# Patient Record
Sex: Male | Born: 1960
Health system: Southern US, Community
[De-identification: ages and names within clinical notes are randomized; demographics above are authoritative.]

## PROBLEM LIST (undated history)

## (undated) DIAGNOSIS — R399 Unspecified symptoms and signs involving the genitourinary system: Secondary | ICD-10-CM

## (undated) DIAGNOSIS — N4340 Spermatocele of epididymis, unspecified: Secondary | ICD-10-CM

## (undated) DIAGNOSIS — R600 Localized edema: Secondary | ICD-10-CM

## (undated) DIAGNOSIS — I4892 Unspecified atrial flutter: Secondary | ICD-10-CM

## (undated) DIAGNOSIS — I10 Essential (primary) hypertension: Secondary | ICD-10-CM

## (undated) DIAGNOSIS — K219 Gastro-esophageal reflux disease without esophagitis: Secondary | ICD-10-CM

## (undated) DIAGNOSIS — J189 Pneumonia, unspecified organism: Secondary | ICD-10-CM

## (undated) DIAGNOSIS — I5032 Chronic diastolic (congestive) heart failure: Secondary | ICD-10-CM

## (undated) DIAGNOSIS — I480 Paroxysmal atrial fibrillation: Secondary | ICD-10-CM

## (undated) DIAGNOSIS — N433 Hydrocele, unspecified: Secondary | ICD-10-CM

## (undated) DIAGNOSIS — I428 Other cardiomyopathies: Secondary | ICD-10-CM

## (undated) DIAGNOSIS — L4050 Arthropathic psoriasis, unspecified: Secondary | ICD-10-CM

## (undated) DIAGNOSIS — L409 Psoriasis, unspecified: Secondary | ICD-10-CM

## (undated) DIAGNOSIS — M21372 Foot drop, left foot: Secondary | ICD-10-CM

## (undated) DIAGNOSIS — Z8719 Personal history of other diseases of the digestive system: Secondary | ICD-10-CM

## (undated) DIAGNOSIS — E7800 Pure hypercholesterolemia, unspecified: Secondary | ICD-10-CM

## (undated) DIAGNOSIS — N529 Male erectile dysfunction, unspecified: Secondary | ICD-10-CM

## (undated) DIAGNOSIS — I499 Cardiac arrhythmia, unspecified: Secondary | ICD-10-CM

## (undated) DIAGNOSIS — I82409 Acute embolism and thrombosis of unspecified deep veins of unspecified lower extremity: Secondary | ICD-10-CM

## (undated) DIAGNOSIS — Z87448 Personal history of other diseases of urinary system: Secondary | ICD-10-CM

## (undated) DIAGNOSIS — E785 Hyperlipidemia, unspecified: Secondary | ICD-10-CM

## (undated) DIAGNOSIS — L0390 Cellulitis, unspecified: Secondary | ICD-10-CM

## (undated) DIAGNOSIS — L03116 Cellulitis of left lower limb: Secondary | ICD-10-CM

## (undated) DIAGNOSIS — Z973 Presence of spectacles and contact lenses: Secondary | ICD-10-CM

## (undated) DIAGNOSIS — Z7901 Long term (current) use of anticoagulants: Secondary | ICD-10-CM

## (undated) HISTORY — PX: ORIF METATARSAL FRACTURE: SUR942

## (undated) HISTORY — PX: BACK SURGERY: SHX140

## (undated) HISTORY — PX: APPENDECTOMY: SHX54

## (undated) HISTORY — DX: Paroxysmal atrial fibrillation: I48.0

## (undated) HISTORY — PX: FRACTURE SURGERY: SHX138

## (undated) HISTORY — DX: Arthropathic psoriasis, unspecified: L40.50

## (undated) HISTORY — DX: Morbid (severe) obesity due to excess calories: E66.01

## (undated) HISTORY — PX: CARDIOVERSION: SHX1299

## (undated) HISTORY — PX: TONSILLECTOMY: SUR1361

## (undated) HISTORY — PX: COLONOSCOPY: SHX174

## (undated) HISTORY — DX: Essential (primary) hypertension: I10

---

## 1999-08-02 HISTORY — PX: ORIF METATARSAL FRACTURE: SUR942

## 2003-02-20 ENCOUNTER — Inpatient Hospital Stay (HOSPITAL_COMMUNITY): Admission: AD | Admit: 2003-02-20 | Discharge: 2003-02-23 | Payer: Self-pay | Admitting: Otolaryngology

## 2003-11-25 ENCOUNTER — Encounter: Admission: RE | Admit: 2003-11-25 | Discharge: 2003-11-25 | Payer: Self-pay | Admitting: Infectious Diseases

## 2004-03-17 ENCOUNTER — Emergency Department (HOSPITAL_COMMUNITY): Admission: EM | Admit: 2004-03-17 | Discharge: 2004-03-17 | Payer: Self-pay | Admitting: Emergency Medicine

## 2004-03-31 ENCOUNTER — Encounter: Admission: RE | Admit: 2004-03-31 | Discharge: 2004-03-31 | Payer: Self-pay

## 2004-04-15 ENCOUNTER — Encounter: Admission: RE | Admit: 2004-04-15 | Discharge: 2004-04-15 | Payer: Self-pay

## 2004-04-29 ENCOUNTER — Encounter: Admission: RE | Admit: 2004-04-29 | Discharge: 2004-04-29 | Payer: Self-pay

## 2005-06-24 ENCOUNTER — Emergency Department (HOSPITAL_COMMUNITY): Admission: EM | Admit: 2005-06-24 | Discharge: 2005-06-24 | Payer: Self-pay | Admitting: Emergency Medicine

## 2005-07-20 ENCOUNTER — Encounter: Admission: RE | Admit: 2005-07-20 | Discharge: 2005-07-20 | Payer: Self-pay | Admitting: Neurological Surgery

## 2007-08-09 ENCOUNTER — Ambulatory Visit: Payer: Self-pay | Admitting: Internal Medicine

## 2007-08-13 ENCOUNTER — Ambulatory Visit: Payer: Self-pay | Admitting: Cardiology

## 2007-08-13 ENCOUNTER — Ambulatory Visit: Payer: Self-pay

## 2007-08-14 ENCOUNTER — Ambulatory Visit: Payer: Self-pay

## 2007-08-14 ENCOUNTER — Encounter: Payer: Self-pay | Admitting: Internal Medicine

## 2007-08-20 ENCOUNTER — Ambulatory Visit: Payer: Self-pay | Admitting: Cardiology

## 2007-08-27 ENCOUNTER — Ambulatory Visit: Payer: Self-pay | Admitting: Cardiology

## 2007-08-31 ENCOUNTER — Ambulatory Visit: Payer: Self-pay | Admitting: Internal Medicine

## 2007-08-31 LAB — CONVERTED CEMR LAB
BUN: 27 mg/dL — ABNORMAL HIGH (ref 6–23)
Basophils Absolute: 0 10*3/uL (ref 0.0–0.1)
CO2: 29 meq/L (ref 19–32)
Eosinophils Absolute: 0.2 10*3/uL (ref 0.0–0.6)
GFR calc Af Amer: 116 mL/min
Hemoglobin: 15.7 g/dL (ref 13.0–17.0)
Lymphocytes Relative: 37.6 % (ref 12.0–46.0)
MCHC: 33.1 g/dL (ref 30.0–36.0)
Monocytes Absolute: 0.6 10*3/uL (ref 0.2–0.7)
Monocytes Relative: 11.3 % — ABNORMAL HIGH (ref 3.0–11.0)
Neutro Abs: 2.6 10*3/uL (ref 1.4–7.7)
Potassium: 4.3 meq/L (ref 3.5–5.1)
aPTT: 39.6 s — ABNORMAL HIGH (ref 21.7–29.8)

## 2007-09-03 ENCOUNTER — Ambulatory Visit: Payer: Self-pay | Admitting: Internal Medicine

## 2007-09-03 ENCOUNTER — Encounter: Payer: Self-pay | Admitting: Internal Medicine

## 2007-09-03 ENCOUNTER — Ambulatory Visit (HOSPITAL_COMMUNITY): Admission: RE | Admit: 2007-09-03 | Discharge: 2007-09-03 | Payer: Self-pay | Admitting: Internal Medicine

## 2007-09-10 ENCOUNTER — Ambulatory Visit: Payer: Self-pay | Admitting: Cardiology

## 2007-10-11 ENCOUNTER — Ambulatory Visit: Payer: Self-pay | Admitting: Cardiology

## 2007-10-11 ENCOUNTER — Ambulatory Visit: Payer: Self-pay | Admitting: Internal Medicine

## 2007-10-18 ENCOUNTER — Ambulatory Visit: Payer: Self-pay | Admitting: Internal Medicine

## 2007-10-18 LAB — CONVERTED CEMR LAB
BUN: 13 mg/dL (ref 6–23)
CO2: 30 meq/L (ref 19–32)
GFR calc Af Amer: 133 mL/min
Potassium: 4.1 meq/L (ref 3.5–5.1)

## 2007-12-12 ENCOUNTER — Ambulatory Visit: Payer: Self-pay | Admitting: Internal Medicine

## 2007-12-17 ENCOUNTER — Ambulatory Visit: Payer: Self-pay | Admitting: Internal Medicine

## 2007-12-17 ENCOUNTER — Ambulatory Visit: Payer: Self-pay | Admitting: Cardiology

## 2007-12-17 LAB — CONVERTED CEMR LAB
Basophils Absolute: 0 10*3/uL (ref 0.0–0.1)
Eosinophils Absolute: 0.1 10*3/uL (ref 0.0–0.7)
GFR calc Af Amer: 133 mL/min
GFR calc non Af Amer: 110 mL/min
Glucose, Bld: 102 mg/dL — ABNORMAL HIGH (ref 70–99)
HCT: 45.2 % (ref 39.0–52.0)
INR: 1.6 — ABNORMAL HIGH (ref 0.8–1.0)
Monocytes Absolute: 0.5 10*3/uL (ref 0.1–1.0)
Monocytes Relative: 10.2 % (ref 3.0–12.0)
Platelets: 152 10*3/uL (ref 150–400)
Potassium: 4.2 meq/L (ref 3.5–5.1)
RDW: 12.5 % (ref 11.5–14.6)
Sodium: 141 meq/L (ref 135–145)
aPTT: 33.9 s — ABNORMAL HIGH (ref 21.7–29.8)

## 2007-12-21 ENCOUNTER — Ambulatory Visit: Payer: Self-pay | Admitting: Internal Medicine

## 2007-12-25 ENCOUNTER — Ambulatory Visit: Payer: Self-pay | Admitting: Internal Medicine

## 2007-12-25 ENCOUNTER — Inpatient Hospital Stay (HOSPITAL_COMMUNITY): Admission: AD | Admit: 2007-12-25 | Discharge: 2007-12-28 | Payer: Self-pay | Admitting: Internal Medicine

## 2007-12-25 ENCOUNTER — Encounter: Payer: Self-pay | Admitting: Internal Medicine

## 2007-12-25 ENCOUNTER — Ambulatory Visit: Payer: Self-pay | Admitting: Cardiology

## 2007-12-26 ENCOUNTER — Encounter: Payer: Self-pay | Admitting: Internal Medicine

## 2007-12-26 LAB — CONVERTED CEMR LAB: Magnesium: 2.1 mg/dL (ref 1.5–2.5)

## 2008-01-04 ENCOUNTER — Ambulatory Visit: Payer: Self-pay | Admitting: Internal Medicine

## 2008-01-24 ENCOUNTER — Ambulatory Visit: Payer: Self-pay | Admitting: Internal Medicine

## 2008-01-24 LAB — CONVERTED CEMR LAB
AST: 26 units/L (ref 0–37)
Albumin: 3.7 g/dL (ref 3.5–5.2)
Alkaline Phosphatase: 66 units/L (ref 39–117)
BUN: 20 mg/dL (ref 6–23)
Bilirubin, Direct: 0.1 mg/dL (ref 0.0–0.3)
CO2: 27 meq/L (ref 19–32)
Chloride: 106 meq/L (ref 96–112)
Cholesterol: 181 mg/dL (ref 0–200)
Glucose, Bld: 103 mg/dL — ABNORMAL HIGH (ref 70–99)
Potassium: 4.3 meq/L (ref 3.5–5.1)
Total Protein: 6.8 g/dL (ref 6.0–8.3)

## 2008-02-11 ENCOUNTER — Ambulatory Visit: Payer: Self-pay

## 2008-03-03 ENCOUNTER — Ambulatory Visit: Payer: Self-pay | Admitting: Internal Medicine

## 2008-03-24 ENCOUNTER — Ambulatory Visit: Payer: Self-pay | Admitting: Internal Medicine

## 2008-04-08 ENCOUNTER — Ambulatory Visit: Payer: Self-pay | Admitting: Cardiology

## 2008-04-21 ENCOUNTER — Ambulatory Visit: Payer: Self-pay | Admitting: Internal Medicine

## 2008-05-05 ENCOUNTER — Ambulatory Visit: Payer: Self-pay | Admitting: Internal Medicine

## 2008-05-15 DIAGNOSIS — I10 Essential (primary) hypertension: Secondary | ICD-10-CM

## 2008-05-15 DIAGNOSIS — I4891 Unspecified atrial fibrillation: Secondary | ICD-10-CM | POA: Insufficient documentation

## 2008-05-23 ENCOUNTER — Ambulatory Visit: Payer: Self-pay | Admitting: Internal Medicine

## 2008-05-23 LAB — CONVERTED CEMR LAB
ALT: 38 units/L (ref 0–53)
Bilirubin, Direct: 0.2 mg/dL (ref 0.0–0.3)
CO2: 30 meq/L (ref 19–32)
CRP, High Sensitivity: 5 (ref 0.00–5.00)
Calcium: 8.4 mg/dL (ref 8.4–10.5)
Chloride: 108 meq/L (ref 96–112)
GFR calc non Af Amer: 110 mL/min
LDL Cholesterol: 84 mg/dL (ref 0–99)
Sodium: 142 meq/L (ref 135–145)
Total Bilirubin: 0.7 mg/dL (ref 0.3–1.2)
Total CHOL/HDL Ratio: 4.2

## 2008-06-20 ENCOUNTER — Ambulatory Visit: Payer: Self-pay | Admitting: Cardiology

## 2008-07-04 ENCOUNTER — Ambulatory Visit: Payer: Self-pay | Admitting: Internal Medicine

## 2008-07-11 ENCOUNTER — Ambulatory Visit: Payer: Self-pay | Admitting: Cardiovascular Disease

## 2008-07-23 ENCOUNTER — Ambulatory Visit: Payer: Self-pay | Admitting: Cardiology

## 2008-08-13 ENCOUNTER — Ambulatory Visit: Payer: Self-pay | Admitting: Cardiology

## 2008-09-03 ENCOUNTER — Ambulatory Visit: Payer: Self-pay | Admitting: Internal Medicine

## 2008-09-03 ENCOUNTER — Ambulatory Visit: Payer: Self-pay | Admitting: Cardiovascular Disease

## 2008-09-03 LAB — CONVERTED CEMR LAB
ALT: 34 units/L (ref 0–53)
Albumin: 3.5 g/dL (ref 3.5–5.2)
Bilirubin, Direct: 0.2 mg/dL (ref 0.0–0.3)
Cholesterol: 136 mg/dL (ref 0–200)
HDL: 30 mg/dL — ABNORMAL LOW (ref >39.0)
LDL Cholesterol: 92 mg/dL (ref 0–99)
Total Protein: 6.3 g/dL (ref 6.0–8.3)
VLDL: 14 mg/dL (ref 0–40)

## 2008-10-02 ENCOUNTER — Ambulatory Visit: Payer: Self-pay | Admitting: Cardiology

## 2008-10-16 ENCOUNTER — Ambulatory Visit: Payer: Self-pay | Admitting: Internal Medicine

## 2008-11-13 ENCOUNTER — Ambulatory Visit: Payer: Self-pay | Admitting: Cardiology

## 2008-12-11 ENCOUNTER — Ambulatory Visit: Payer: Self-pay | Admitting: Cardiology

## 2008-12-31 ENCOUNTER — Encounter: Payer: Self-pay | Admitting: *Deleted

## 2009-01-01 ENCOUNTER — Ambulatory Visit: Payer: Self-pay | Admitting: Cardiology

## 2009-01-01 LAB — CONVERTED CEMR LAB
POC INR: 3.1
Protime: 21.3

## 2009-01-21 ENCOUNTER — Ambulatory Visit: Payer: Self-pay | Admitting: Internal Medicine

## 2009-01-21 DIAGNOSIS — R609 Edema, unspecified: Secondary | ICD-10-CM

## 2009-01-21 LAB — CONVERTED CEMR LAB
INR: 4.9 — ABNORMAL HIGH (ref 0.8–1.0)
POC INR: 4.9

## 2009-02-04 ENCOUNTER — Ambulatory Visit: Payer: Self-pay | Admitting: Internal Medicine

## 2009-02-04 ENCOUNTER — Encounter: Payer: Self-pay | Admitting: *Deleted

## 2009-02-04 LAB — CONVERTED CEMR LAB
BUN: 16 mg/dL (ref 6–23)
CO2: 29 meq/L (ref 19–32)
Calcium: 8.4 mg/dL (ref 8.4–10.5)
Chloride: 108 meq/L (ref 96–112)
Creatinine, Ser: 0.7 mg/dL (ref 0.4–1.5)
Glucose, Bld: 101 mg/dL — ABNORMAL HIGH (ref 70–99)

## 2009-02-05 ENCOUNTER — Encounter: Payer: Self-pay | Admitting: Internal Medicine

## 2009-02-20 ENCOUNTER — Ambulatory Visit: Payer: Self-pay | Admitting: Internal Medicine

## 2009-02-20 LAB — CONVERTED CEMR LAB: POC INR: 3.9

## 2009-03-16 ENCOUNTER — Ambulatory Visit: Payer: Self-pay | Admitting: Cardiology

## 2009-04-07 ENCOUNTER — Ambulatory Visit: Payer: Self-pay | Admitting: Cardiology

## 2009-04-21 ENCOUNTER — Ambulatory Visit: Payer: Self-pay | Admitting: Internal Medicine

## 2009-04-21 LAB — CONVERTED CEMR LAB: POC INR: 3.6

## 2009-05-05 ENCOUNTER — Ambulatory Visit: Payer: Self-pay | Admitting: Cardiology

## 2009-05-05 LAB — CONVERTED CEMR LAB: POC INR: 3.5

## 2009-05-26 ENCOUNTER — Ambulatory Visit: Payer: Self-pay | Admitting: Cardiology

## 2009-05-26 LAB — CONVERTED CEMR LAB: POC INR: 2.3

## 2009-06-16 ENCOUNTER — Ambulatory Visit: Payer: Self-pay | Admitting: Cardiology

## 2009-06-16 LAB — CONVERTED CEMR LAB: POC INR: 2.9

## 2009-07-14 ENCOUNTER — Ambulatory Visit: Payer: Self-pay | Admitting: Cardiovascular Disease

## 2009-07-14 LAB — CONVERTED CEMR LAB: POC INR: 2.3

## 2009-08-13 ENCOUNTER — Ambulatory Visit: Payer: Self-pay | Admitting: Cardiology

## 2009-08-13 LAB — CONVERTED CEMR LAB: POC INR: 1.8

## 2009-08-28 ENCOUNTER — Ambulatory Visit: Payer: Self-pay | Admitting: Internal Medicine

## 2009-09-15 ENCOUNTER — Ambulatory Visit: Payer: Self-pay | Admitting: Cardiology

## 2009-09-15 ENCOUNTER — Ambulatory Visit: Payer: Self-pay | Admitting: Internal Medicine

## 2009-09-16 LAB — CONVERTED CEMR LAB
ALT: 40 units/L (ref 0–53)
AST: 22 units/L (ref 0–37)
BUN: 19 mg/dL (ref 6–23)
Bilirubin, Direct: 0.1 mg/dL (ref 0.0–0.3)
CO2: 28 meq/L (ref 19–32)
Calcium: 8.6 mg/dL (ref 8.4–10.5)
GFR calc non Af Amer: 109.12 mL/min (ref >60)
Glucose, Bld: 105 mg/dL — ABNORMAL HIGH (ref 70–99)
HDL: 35.9 mg/dL — ABNORMAL LOW (ref >39.00)
Sodium: 142 meq/L (ref 135–145)
Total Bilirubin: 0.7 mg/dL (ref 0.3–1.2)
Total CHOL/HDL Ratio: 3

## 2009-09-25 ENCOUNTER — Telehealth: Payer: Self-pay | Admitting: Internal Medicine

## 2009-10-13 ENCOUNTER — Ambulatory Visit: Payer: Self-pay | Admitting: Cardiology

## 2009-10-13 LAB — CONVERTED CEMR LAB: POC INR: 2.3

## 2009-11-10 ENCOUNTER — Ambulatory Visit: Payer: Self-pay | Admitting: Cardiology

## 2009-11-10 LAB — CONVERTED CEMR LAB: POC INR: 2.5

## 2009-12-08 ENCOUNTER — Ambulatory Visit: Payer: Self-pay | Admitting: Cardiology

## 2010-01-05 ENCOUNTER — Ambulatory Visit: Payer: Self-pay | Admitting: Cardiovascular Disease

## 2010-01-05 LAB — CONVERTED CEMR LAB: POC INR: 2.4

## 2010-02-04 ENCOUNTER — Ambulatory Visit: Payer: Self-pay | Admitting: Internal Medicine

## 2010-02-04 LAB — CONVERTED CEMR LAB: POC INR: 2.5

## 2010-03-04 ENCOUNTER — Ambulatory Visit: Payer: Self-pay | Admitting: Internal Medicine

## 2010-03-04 LAB — CONVERTED CEMR LAB: POC INR: 2.5

## 2010-03-15 ENCOUNTER — Encounter (INDEPENDENT_AMBULATORY_CARE_PROVIDER_SITE_OTHER): Payer: Self-pay | Admitting: *Deleted

## 2010-03-25 ENCOUNTER — Ambulatory Visit: Payer: Self-pay | Admitting: Internal Medicine

## 2010-04-22 ENCOUNTER — Ambulatory Visit: Payer: Self-pay | Admitting: Internal Medicine

## 2010-04-22 DIAGNOSIS — E785 Hyperlipidemia, unspecified: Secondary | ICD-10-CM

## 2010-04-30 ENCOUNTER — Encounter: Payer: Self-pay | Admitting: Internal Medicine

## 2010-04-30 ENCOUNTER — Ambulatory Visit: Payer: Self-pay

## 2010-04-30 ENCOUNTER — Ambulatory Visit: Payer: Self-pay | Admitting: Internal Medicine

## 2010-04-30 ENCOUNTER — Ambulatory Visit (HOSPITAL_COMMUNITY): Admission: RE | Admit: 2010-04-30 | Discharge: 2010-04-30 | Payer: Self-pay | Admitting: Internal Medicine

## 2010-05-05 LAB — CONVERTED CEMR LAB
Albumin: 3.7 g/dL (ref 3.5–5.2)
CO2: 25 meq/L (ref 19–32)
Calcium: 8.7 mg/dL (ref 8.4–10.5)
Cholesterol: 136 mg/dL (ref 0–200)
Creatinine, Ser: 0.6 mg/dL (ref 0.4–1.5)
GFR calc non Af Amer: 146.07 mL/min (ref >60)
Glucose, Bld: 91 mg/dL (ref 70–99)
HDL: 34.1 mg/dL — ABNORMAL LOW (ref >39.00)
Total Protein: 6.2 g/dL (ref 6.0–8.3)
Triglycerides: 52 mg/dL (ref 0.0–149.0)
VLDL: 10.4 mg/dL (ref 0.0–40.0)

## 2010-05-13 ENCOUNTER — Ambulatory Visit: Payer: Self-pay | Admitting: Cardiology

## 2010-05-21 ENCOUNTER — Telehealth: Payer: Self-pay | Admitting: Internal Medicine

## 2010-06-10 ENCOUNTER — Ambulatory Visit: Payer: Self-pay | Admitting: Cardiology

## 2010-07-08 ENCOUNTER — Ambulatory Visit: Payer: Self-pay | Admitting: Internal Medicine

## 2010-08-05 ENCOUNTER — Ambulatory Visit: Admission: RE | Admit: 2010-08-05 | Discharge: 2010-08-05 | Payer: Self-pay | Source: Home / Self Care

## 2010-08-29 LAB — CONVERTED CEMR LAB: POC INR: 3.2

## 2010-09-02 ENCOUNTER — Encounter: Payer: Self-pay | Admitting: Cardiology

## 2010-09-02 ENCOUNTER — Encounter (INDEPENDENT_AMBULATORY_CARE_PROVIDER_SITE_OTHER): Payer: BC Managed Care – PPO

## 2010-09-02 ENCOUNTER — Ambulatory Visit: Admit: 2010-09-02 | Payer: Self-pay

## 2010-09-02 DIAGNOSIS — Z7901 Long term (current) use of anticoagulants: Secondary | ICD-10-CM

## 2010-09-02 DIAGNOSIS — I4891 Unspecified atrial fibrillation: Secondary | ICD-10-CM

## 2010-09-02 LAB — CONVERTED CEMR LAB: POC INR: 2.5

## 2010-09-02 NOTE — Medication Information (Signed)
Summary: rov/sp  Anticoagulant Therapy  Managed by: Cloyde Reams, RN, BSN Referring MD: Arvilla Meres Supervising MD: Graciela Husbands MD, Viviann Spare Indication 1: Atrial Fibrillation (ICD-427.31) Lab Used: [facility] Site: [facility] INR POC 3.1 INR RANGE 2 - 3  Dietary changes: yes       Details: Decr vit K intake on vacation.   Health status changes: no    Bleeding/hemorrhagic complications: no    Recent/future hospitalizations: no    Any changes in medication regimen? yes       Details: 4 days azo.  Recent/future dental: no  Any missed doses?: no       Is patient compliant with meds? yes       Allergies: 1)  ! Lisinopril  Anticoagulation Management History:      The patient is taking warfarin and comes in today for a routine follow up visit.  Negative risk factors for bleeding include an age less than 31 years old.  The bleeding index is 'low risk'.  Positive CHADS2 values include History of HTN.  Negative CHADS2 values include Age > 14 years old.  The start date was 08/06/2007.  His last INR was 4.9 ratio.  Anticoagulation responsible provider: Graciela Husbands MD, Viviann Spare.  INR POC: 3.1.  Cuvette Lot#: [id number].  Exp: 05/2011.    Anticoagulation Management Assessment/Plan:      The patient's current anticoagulation dose is Coumadin 5 mg tabs: Take as directed by coumadin clinic..  The target INR is 2 - 3.  The next INR is due 04/22/2010.  Anticoagulation instructions were given to patient.  Results were reviewed/authorized by Cloyde Reams, RN, BSN.  He was notified by Cloyde Reams RN.         Prior Anticoagulation Instructions: INR 2.5  Continue same dose of 1 1/2 tablets every day.   Current Anticoagulation Instructions: INR 3.1  Take 5mg  today, then resume same dosage 7.5mg  daily.  Recheck 4 weeks.

## 2010-09-02 NOTE — Medication Information (Signed)
Summary: rov/sp  Anticoagulant Therapy  Managed by: Weston Brass, PharmD Referring MD: Arvilla Meres Supervising MD: Tenny Craw MD, Gunnar Fusi Indication 1: Atrial Fibrillation (ICD-427.31) Lab Used: [facility] Site: [facility] INR POC 2.5 INR RANGE 2 - 3  Dietary changes: no    Health status changes: no    Bleeding/hemorrhagic complications: no    Recent/future hospitalizations: no    Any changes in medication regimen? no    Recent/future dental: no  Any missed doses?: no       Is patient compliant with meds? yes       Allergies: 1)  ! Lisinopril  Anticoagulation Management History:      The patient is taking warfarin and comes in today for a routine follow up visit.  Negative risk factors for bleeding include an age less than 39 years old.  The bleeding index is 'low risk'.  Positive CHADS2 values include History of HTN.  Negative CHADS2 values include Age > 63 years old.  The start date was 08/06/2007.  His last INR was 4.9 ratio.  Anticoagulation responsible provider: Tenny Craw MD, Gunnar Fusi.  INR POC: 2.5.  Cuvette Lot#: [id number].  Exp: 04/2011.    Anticoagulation Management Assessment/Plan:      The patient's current anticoagulation dose is Coumadin 5 mg tabs: Take as directed by coumadin clinic..  The target INR is 2 - 3.  The next INR is due 03/04/2010.  Anticoagulation instructions were given to patient.  Results were reviewed/authorized by Weston Brass, PharmD.  He was notified by Weston Brass PharmD.         Prior Anticoagulation Instructions: INR 2.4  Continue same dose of 1 1/2 tablets every day.   Current Anticoagulation Instructions: INR 2.5  Continue same dose of 1 1/2 tablets every day.

## 2010-09-02 NOTE — Medication Information (Signed)
Summary: Mark Gonzalez      Allergies Added:  Anticoagulant Therapy  Managed by: Reina Fuse, PharmD Referring MD: Arvilla Meres Supervising MD: Jens Som MD, Arlys John Indication 1: Atrial Fibrillation (ICD-427.31) Lab Used: [facility] Site: [facility] INR POC 2.6 INR RANGE 2 - 3  Dietary changes: yes       Details: Has eaten less leafy greens than usual.   Health status changes: no    Bleeding/hemorrhagic complications: no    Recent/future hospitalizations: no    Any changes in medication regimen? no    Recent/future dental: no  Any missed doses?: no       Is patient compliant with meds? yes       Current Medications (verified): 1)  Lovastatin 20 Mg Tabs (Lovastatin) .... Take One Tablet By Mouth Daily At Bedtime 2)  Enbrel 25 Mg/0.66ml Soln (Etanercept) .... Inject 25mg  Subcutaneously Two Times A Week 3)  Niaspan 500 Mg Cr-Tabs (Niacin (Antihyperlipidemic)) .... Take 1 Tablet By Mouth At Bedtime As Directed Take 1 Tablet Aspirin 1/2 Half Hour Prior To Niaspan 4)  Arthrotec 75 75-200 Mg-Mcg Tabs (Diclofenac-Misoprostol) .... Take 1 Tablet By Mouth Two Times A Day 5)  Tikosyn 500 Mcg Caps (Dofetilide) .... Take 1 Capsule By Mouth Two Times A Day 6)  Diovan 80 Mg Tabs (Valsartan) .... Take One Tablet By Mouth Daily 7)  Adult Aspirin Ec Low Strength 81 Mg Tbec (Aspirin) .... Take 1 Tablet Daily 8)  Multivitamins  Tabs (Multiple Vitamin) .... Take 1 Tablet By Mouth Daily 9)  Fish Oil 1000 Mg Caps (Omega-3 Fatty Acids) .... Take 1 Capsule By Mouth Daily 10)  Coumadin 5 Mg Tabs (Warfarin Sodium) .... Take As Directed By Coumadin Clinic. 11)  Furosemide 20 Mg Tabs (Furosemide) .... Take 1 Tab Every Monday, Wed, and Fri 12)  Potassium Chloride Crys Cr 20 Meq Cr-Tabs (Potassium Chloride Crys Cr) .... Take 1 Tab Every Monday, Wed, and Fri 13)  Prostate  Tabs (Specialty Vitamins Products) .Marland Kitchen.. 1 By Mouth Daily  Allergies (verified): 1)  ! Lisinopril  Anticoagulation Management  History:      The patient is taking warfarin and comes in today for a routine follow up visit.  Negative risk factors for bleeding include an age less than 51 years old.  The bleeding index is 'low risk'.  Positive CHADS2 values include History of HTN.  Negative CHADS2 values include Age > 82 years old.  The start date was 08/06/2007.  His last INR was 4.9 ratio.  Anticoagulation responsible provider: Jens Som MD, Arlys John.  INR POC: 2.6.  Cuvette Lot#: [id number].  Exp: 05/2011.    Anticoagulation Management Assessment/Plan:      The patient's current anticoagulation dose is Coumadin 5 mg tabs: Take as directed by coumadin clinic..  The target INR is 2 - 3.  The next INR is due 06/10/2010.  Anticoagulation instructions were given to patient.  Results were reviewed/authorized by Reina Fuse, PharmD.  He was notified by Reina Fuse PharmD.         Prior Anticoagulation Instructions: INR 3.2 skip today's dose. Change your friday dose to 1 tablet instead of 1 and a half. All other days take 1 and a half tablets. See you in 3 weeks.  Current Anticoagulation Instructions: INR 2.6  Continue taking Coumadin 7.5 mg (1.5 tabs) every day except Coumadin 5 mg (1 tab) on Fridays. Return to clinic in 4 weeks.

## 2010-09-02 NOTE — Medication Information (Signed)
Summary: rov/ewj   Anticoagulant Therapy  Managed by: Lyna Poser, PharmD Referring MD: Arvilla Meres Supervising MD: Bensimhon MD,Daniel Indication 1: Atrial Fibrillation (ICD-427.31) Lab Used: [facility] Site: [facility] INR POC 3.2 INR RANGE 2 - 3  Dietary changes: no    Health status changes: no    Bleeding/hemorrhagic complications: no    Recent/future hospitalizations: no    Any changes in medication regimen? no    Recent/future dental: no  Any missed doses?: no       Is patient compliant with meds? yes       Allergies: 1)  ! Lisinopril  Anticoagulation Management History:      The patient is taking warfarin and comes in today for a routine follow up visit.  Negative risk factors for bleeding include an age less than 65 years old.  The bleeding index is 'low risk'.  Positive CHADS2 values include History of HTN.  Negative CHADS2 values include Age > 18 years old.  The start date was 08/06/2007.  His last INR was 4.9 ratio.  Anticoagulation responsible Melanie Openshaw: Bensimhon MD,Daniel.  INR POC: 3.2.  Cuvette Lot#: [id number].  Exp: 05/2011.    Anticoagulation Management Assessment/Plan:      The patient's current anticoagulation dose is Coumadin 5 mg tabs: Take as directed by coumadin clinic..  The target INR is 2 - 3.  The next INR is due 05/13/2010.  Anticoagulation instructions were given to patient.  Results were reviewed/authorized by Lyna Poser, PharmD.         Prior Anticoagulation Instructions: INR 3.1  Take 5mg  today, then resume same dosage 7.5mg  daily.  Recheck 4 weeks.    Current Anticoagulation Instructions: INR 3.2 skip today's dose. Change your friday dose to 1 tablet instead of 1 and a half. All other days take 1 and a half tablets. See you in 3 weeks.

## 2010-09-02 NOTE — Assessment & Plan Note (Signed)
Summary: 6 month rov/sl      Allergies Added:   History of Present Illness: Mark Gonzalez is a very pleasant with a history of psoriatic arthritis, obesity, hypertension, and atrial fibrillation.  He is maintained in sinus rhythm on Tikosyn.  He previously has a negative Myoview with an EF of 45-50%.  His ejection fraction was thought to be mildly depressed due to presence of atrial fibrillation.   Doing very well from a cardiac status. No palpitations, dyspnea or CP. No problems with bleeding. No problems with orthopnea, PND. Occasional edema. Blood pressure at home 110-130/70-80s.   Has gained weight over the hoildays and now participating in 21-minute workout training program. Trying to watch diet more closely.    Current Medications (verified): 1)  Lovastatin 20 Mg Tabs (Lovastatin) .... Take One Tablet By Mouth Daily At Bedtime 2)  Enbrel 25 Mg/0.45ml Soln (Etanercept) .... Inject 25mg  Subcutaneously Two Times A Week 3)  Niaspan 500 Mg Cr-Tabs (Niacin (Antihyperlipidemic)) .... Take 1 Tablet By Mouth At Bedtime As Directed Take 1 Tablet Aspirin 1/2 Half Hour Prior To Niaspan 4)  Arthrotec 75 75-200 Mg-Mcg Tabs (Diclofenac-Misoprostol) .... Take 1 Tablet By Mouth Two Times A Day 5)  Tikosyn 500 Mcg Caps (Dofetilide) .... Take 1 Capsule By Mouth Two Times A Day 6)  Diovan 80 Mg Tabs (Valsartan) .... Take One Tablet By Mouth Daily 7)  Adult Aspirin Ec Low Strength 81 Mg Tbec (Aspirin) .... Take 1 Tablet Daily 8)  Multivitamins  Tabs (Multiple Vitamin) .... Take 1 Tablet By Mouth Daily 9)  Fish Oil 1000 Mg Caps (Omega-3 Fatty Acids) .... Take 1 Capsule By Mouth Daily 10)  Coumadin 5 Mg Tabs (Warfarin Sodium) .... Take As Directed By Coumadin Clinic. 11)  Furosemide 20 Mg Tabs (Furosemide) .... Take 1 Tab Every Monday, Wed, and Fri 12)  Potassium Chloride Crys Cr 20 Meq Cr-Tabs (Potassium Chloride Crys Cr) .... Take 1 Tab Every Monday, Wed, and Fri  Allergies (verified): 1)  !  Lisinopril  New Orders:     1)  EKG w/ Interpretation (93000)  Due: 08/28/2009   Past History:  Past Medical History: Reviewed history from 07/20/2008 and no changes required. 1) Paroxysmal atrial fibrillation       -s/p DC-CV in 2/09 with reversion to AF       -now on Tikosyn and Chronic Coumadin       -EF 60%        -Myoview 2009 EF 45-50% in settinf of AF. No ischemia or scar 2) Morbid obesity 3) Hypertension 4) Psoriatic arthritis  Review of Systems       As per HPI and past medical history; otherwise all systems negative.   Vital Signs:  Patient profile:   50 year old male Height:      72 inches Weight:      387 pounds BMI:     52.68 Pulse rate:   62 / minute BP sitting:   116 / 80  (right arm) Cuff size:   large  Vitals Entered By: Hardin Negus, RMA (August 28, 2009 10:52 AM)  Physical Exam  General:   He is in no acute distress, ambulates around the clinic without any respiratory difficulty. HEENT:  Normal. NECK:  Supple and thick.  No obvious JVD.  Carotids are 2+ bilaterally without any bruits.  There is no lymphadenopathy or thyromegaly. CARDIAC:  PMI is not palpable.  He is regular and bradycardic.  No obvious murmurs. LUNGS:  Clear. ABDOMEN:  Obese, nontender, and nondistended.   EXTREMITIES:  Warm with no cyanosis, clubbing, or edema.  No rash.  Good pulses. NEURO:  Alert and oriented x3.  Cranial nerves II through XII are intact.  He moves all fours without difficulty.  Affect is pleasant.    Impression & Recommendations:  Problem # 1:  ATRIAL FIBRILLATION (ICD-427.31) Maintain sinus rhtyhm on Tikosyn. tolerating coumadin well.  Problem # 2:  ESSENTIAL HYPERTENSION, BENIGN (ICD-401.1) Blood pressure well controlled. Continue current regimen.  Problem # 3:  MORBID OBESITY (ICD-278.01) Had discussion about lap badn and information  Other Orders: EKG w/ Interpretation (93000)  Patient Instructions: 1)  Your physician recommends that you  return for a FASTING lipid profile: with your next coumadin check 2)  Follow up in 6 months  Appended Document: 6 month rov/sl increase fish oil to 3g per day

## 2010-09-02 NOTE — Medication Information (Signed)
Summary: rov/ewj  Anticoagulant Therapy  Managed by: Cloyde Reams, RN, BSN Referring MD: Arvilla Meres Supervising MD: [name], Rowen Wilmer Indication 1: Atrial Fibrillation (ICD-427.31) Lab Used: [facility] Site: [facility] INR POC 2.3 INR RANGE 2 - 3  Dietary changes: no    Health status changes: no    Bleeding/hemorrhagic complications: no    Recent/future hospitalizations: no    Any changes in medication regimen? no    Recent/future dental: no  Any missed doses?: no       Is patient compliant with meds? yes       Allergies (verified): 1)  ! Lisinopril  Anticoagulation Management History:      The patient is taking warfarin and comes in today for a routine follow up visit.  Negative risk factors for bleeding include an age less than 29 years old.  The bleeding index is 'low risk'.  Positive CHADS2 values include History of HTN.  Negative CHADS2 values include Age > 33 years old.  The start date was 08/06/2007.  His last INR was 4.9 ratio.  Anticoagulation responsible provider: [name], Haylin Camilli.  INR POC: 2.3.  Cuvette Lot#: [id number].  Exp: 11/2010.    Anticoagulation Management Assessment/Plan:      The patient's current anticoagulation dose is Coumadin 5 mg tabs: Take as directed by coumadin clinic..  The target INR is 2 - 3.  The next INR is due 11/10/2009.  Anticoagulation instructions were given to patient.  Results were reviewed/authorized by Cloyde Reams, RN, BSN.  He was notified by Cloyde Reams RN.         Prior Anticoagulation Instructions: INR 2.1  Continue on same dosage 7.5mg  daily.  Recheck in 4 weeks.    Current Anticoagulation Instructions: INR 2.3  Continue on same dosage 7.5mg  daily.  Recheck in 4 weeks.

## 2010-09-02 NOTE — Medication Information (Signed)
Summary: rov/tm  Anticoagulant Therapy  Managed by: Bethena Midget, RN Referring MD: Arvilla Meres Supervising MD: Gala Romney MD, Reuel Boom Indication 1: Atrial Fibrillation (ICD-427.31) Lab Used: [facility] Site: [facility] INR POC 1.8 INR RANGE 2 - 3  Dietary changes: no    Health status changes: no    Bleeding/hemorrhagic complications: no    Recent/future hospitalizations: no    Any changes in medication regimen? no    Recent/future dental: no  Any missed doses?: no       Is patient compliant with meds? yes      Comments: Saw Dr. Gala Romney today.   Allergies: 1)  ! Lisinopril  Anticoagulation Management History:      The patient is taking warfarin and comes in today for a routine follow up visit.  Negative risk factors for bleeding include an age less than 109 years old.  The bleeding index is 'low risk'.  Positive CHADS2 values include History of HTN.  Negative CHADS2 values include Age > 93 years old.  The start date was 08/06/2007.  His last INR was 4.9 ratio.  Anticoagulation responsible provider: Yasmen Cortner MD, Reuel Boom.  INR POC: 1.8.  Cuvette Lot#: [id number].  Exp: 10/2010.    Anticoagulation Management Assessment/Plan:      The patient's current anticoagulation dose is Coumadin 5 mg tabs: Take as directed by coumadin clinic..  The target INR is 2 - 3.  The next INR is due 09/15/2009.  Anticoagulation instructions were given to patient.  Results were reviewed/authorized by Bethena Midget, RN.  He was notified by Bethena Midget, RN, BSN.         Prior Anticoagulation Instructions: INR 1.8  Increase dose to 1.5 tablets daily except 1 tablet on Wednesdays. Pt is increasing his intake of Vitamin K. Recheck 1/28.    Current Anticoagulation Instructions: INR 1.8 Today take 10mg s then change dose to 7.5mg s everyday. Recheck in 2 1/2 weeks.

## 2010-09-02 NOTE — Medication Information (Signed)
Summary: rov/eac  Anticoagulant Therapy  Managed by: Weston Brass, PharmD Referring MD: Arvilla Meres Supervising MD: Eden Emms MD, Theron Arista Indication 1: Atrial Fibrillation (ICD-427.31) Lab Used: [facility] Site: [facility] INR POC 2.4 INR RANGE 2 - 3  Dietary changes: no    Health status changes: no    Bleeding/hemorrhagic complications: no    Recent/future hospitalizations: no    Any changes in medication regimen? yes       Details: off rapaflo  Recent/future dental: no  Any missed doses?: no       Is patient compliant with meds? yes       Current Medications (verified): 1)  Lovastatin 20 Mg Tabs (Lovastatin) .... Take One Tablet By Mouth Daily At Bedtime 2)  Enbrel 25 Mg/0.62ml Soln (Etanercept) .... Inject 25mg  Subcutaneously Two Times A Week 3)  Niaspan 500 Mg Cr-Tabs (Niacin (Antihyperlipidemic)) .... Take 1 Tablet By Mouth At Bedtime As Directed Take 1 Tablet Aspirin 1/2 Half Hour Prior To Niaspan 4)  Arthrotec 75 75-200 Mg-Mcg Tabs (Diclofenac-Misoprostol) .... Take 1 Tablet By Mouth Two Times A Day 5)  Tikosyn 500 Mcg Caps (Dofetilide) .... Take 1 Capsule By Mouth Two Times A Day 6)  Diovan 80 Mg Tabs (Valsartan) .... Take One Tablet By Mouth Daily 7)  Adult Aspirin Ec Low Strength 81 Mg Tbec (Aspirin) .... Take 1 Tablet Daily 8)  Multivitamins  Tabs (Multiple Vitamin) .... Take 1 Tablet By Mouth Daily 9)  Fish Oil 1000 Mg Caps (Omega-3 Fatty Acids) .... Take 1 Capsule By Mouth Daily 10)  Coumadin 5 Mg Tabs (Warfarin Sodium) .... Take As Directed By Coumadin Clinic. 11)  Furosemide 20 Mg Tabs (Furosemide) .... Take 1 Tab Every Monday, Wed, and Fri 12)  Potassium Chloride Crys Cr 20 Meq Cr-Tabs (Potassium Chloride Crys Cr) .... Take 1 Tab Every Monday, Wed, and Fri  Allergies: 1)  ! Lisinopril  Anticoagulation Management History:      The patient is taking warfarin and comes in today for a routine follow up visit.  Negative risk factors for bleeding include an  age less than 21 years old.  The bleeding index is 'low risk'.  Positive CHADS2 values include History of HTN.  Negative CHADS2 values include Age > 45 years old.  The start date was 08/06/2007.  His last INR was 4.9 ratio.  Anticoagulation responsible provider: Eden Emms MD, Theron Arista.  INR POC: 2.4.  Cuvette Lot#: [id number].  Exp: 03/2011.    Anticoagulation Management Assessment/Plan:      The patient's current anticoagulation dose is Coumadin 5 mg tabs: Take as directed by coumadin clinic..  The target INR is 2 - 3.  The next INR is due 02/04/2010.  Anticoagulation instructions were given to patient.  Results were reviewed/authorized by Weston Brass, PharmD.  He was notified by Weston Brass PharmD.         Prior Anticoagulation Instructions: INR 2.8  Continue taking 1.5 tablets every day.  Return to clinic in 4 weeks.   Current Anticoagulation Instructions: INR 2.4  Continue same dose of 1 1/2 tablets every day.

## 2010-09-02 NOTE — Medication Information (Signed)
Summary: rov/sp  Anticoagulant Therapy  Managed by: Eda Keys, PharmD Referring MD: Arvilla Meres Supervising MD: Jens Som MD, Arlys John Indication 1: Atrial Fibrillation (ICD-427.31) Lab Used: [facility] Site: [facility] INR POC 2.8 INR RANGE 2 - 3  Dietary changes: no    Health status changes: no    Bleeding/hemorrhagic complications: no    Recent/future hospitalizations: no    Any changes in medication regimen? no    Recent/future dental: no  Any missed doses?: no       Is patient compliant with meds? yes       Allergies: 1)  ! Lisinopril  Anticoagulation Management History:      The patient is taking warfarin and comes in today for a routine follow up visit.  Negative risk factors for bleeding include an age less than 58 years old.  The bleeding index is 'low risk'.  Positive CHADS2 values include History of HTN.  Negative CHADS2 values include Age > 67 years old.  The start date was 08/06/2007.  His last INR was 4.9 ratio.  Anticoagulation responsible provider: Jens Som MD, Arlys John.  INR POC: 2.8.  Cuvette Lot#: [id number].  Exp: 03/2011.    Anticoagulation Management Assessment/Plan:      The patient's current anticoagulation dose is Coumadin 5 mg tabs: Take as directed by coumadin clinic..  The target INR is 2 - 3.  The next INR is due 01/05/2010.  Anticoagulation instructions were given to patient.  Results were reviewed/authorized by Eda Keys, PharmD.  He was notified by Eda Keys, PharmD.         Prior Anticoagulation Instructions: INR 2.5  Continue same dose of 7.5mg  (1 1/2 tablets) every day   Current Anticoagulation Instructions: INR 2.8  Continue taking 1.5 tablets every day.  Return to clinic in 4 weeks.

## 2010-09-02 NOTE — Medication Information (Signed)
Summary: rov/sl   Anticoagulant Therapy  Managed by: Weston Brass, PharmD Referring MD: Arvilla Meres Supervising MD: Jens Som MD, Arlys John Indication 1: Atrial Fibrillation (ICD-427.31) Lab Used: [facility] Site: [facility] INR POC 2.5 INR RANGE 2 - 3  Dietary changes: no    Health status changes: no    Bleeding/hemorrhagic complications: no    Recent/future hospitalizations: no    Any changes in medication regimen? no    Recent/future dental: no  Any missed doses?: no       Is patient compliant with meds? yes       Allergies: 1)  ! Lisinopril  Anticoagulation Management History:      The patient is taking warfarin and comes in today for a routine follow up visit.  Negative risk factors for bleeding include an age less than 84 years old.  The bleeding index is 'low risk'.  Positive CHADS2 values include History of HTN.  Negative CHADS2 values include Age > 27 years old.  The start date was 08/06/2007.  His last INR was 4.9 ratio.  Anticoagulation responsible provider: Jens Som MD, Arlys John.  INR POC: 2.5.  Cuvette Lot#: [id number].  Exp: 06/2011.    Anticoagulation Management Assessment/Plan:      The patient's current anticoagulation dose is Coumadin 5 mg tabs: Take as directed by coumadin clinic..  The target INR is 2 - 3.  The next INR is due 07/08/2010.  Anticoagulation instructions were given to patient.  Results were reviewed/authorized by Weston Brass, PharmD.  He was notified by Hoy Register, PharmD Candidate.         Prior Anticoagulation Instructions: INR 2.6  Continue taking Coumadin 7.5 mg (1.5 tabs) every day except Coumadin 5 mg (1 tab) on Fridays. Return to clinic in 4 weeks.   Current Anticoagulation Instructions: INR 2.5 Continue previous dose of 1.5 tablets everyday except 1 tablet on Friday Recheck INR in 4 weeks.

## 2010-09-02 NOTE — Medication Information (Signed)
Summary: rov/sp  Anticoagulant Therapy  Managed by: Weston Brass, PharmD Referring MD: Arvilla Meres Supervising MD: Tenny Craw MD, Gunnar Fusi Indication 1: Atrial Fibrillation (ICD-427.31) Lab Used: [facility] Site: [facility] INR POC 2.5 INR RANGE 2 - 3  Dietary changes: no    Health status changes: no    Bleeding/hemorrhagic complications: no    Recent/future hospitalizations: no    Any changes in medication regimen? no    Recent/future dental: no  Any missed doses?: no       Is patient compliant with meds? yes       Allergies: 1)  ! Lisinopril  Anticoagulation Management History:      The patient is taking warfarin and comes in today for a routine follow up visit.  Negative risk factors for bleeding include an age less than 30 years old.  The bleeding index is 'low risk'.  Positive CHADS2 values include History of HTN.  Negative CHADS2 values include Age > 37 years old.  The start date was 08/06/2007.  His last INR was 4.9 ratio.  Anticoagulation responsible provider: Tenny Craw MD, Gunnar Fusi.  INR POC: 2.5.  Cuvette Lot#: [id number].  Exp: 05/2011.    Anticoagulation Management Assessment/Plan:      The patient's current anticoagulation dose is Coumadin 5 mg tabs: Take as directed by coumadin clinic..  The target INR is 2 - 3.  The next INR is due 03/25/2010.  Anticoagulation instructions were given to patient.  Results were reviewed/authorized by Weston Brass, PharmD.  He was notified by Weston Brass PharmD.         Prior Anticoagulation Instructions: INR 2.5  Continue same dose of 1 1/2 tablets every day.   Current Anticoagulation Instructions: Same as Prior Instructions.

## 2010-09-02 NOTE — Medication Information (Signed)
Summary: rov/tm  Anticoagulant Therapy  Managed by: Cloyde Reams, RN, BSN Referring MD: Arvilla Meres Supervising MD: Myrtis Ser MD, Tinnie Gens Indication 1: Atrial Fibrillation (ICD-427.31) Lab Used: [facility] Site: [facility] INR POC 2.1 INR RANGE 2 - 3  Dietary changes: no    Health status changes: no    Bleeding/hemorrhagic complications: no    Recent/future hospitalizations: no    Any changes in medication regimen? no    Recent/future dental: no  Any missed doses?: no       Is patient compliant with meds? yes       Allergies (verified): 1)  ! Lisinopril  Anticoagulation Management History:      The patient is taking warfarin and comes in today for a routine follow up visit.  Negative risk factors for bleeding include an age less than 65 years old.  The bleeding index is 'low risk'.  Positive CHADS2 values include History of HTN.  Negative CHADS2 values include Age > 82 years old.  The start date was 08/06/2007.  His last INR was 4.9 ratio.  Anticoagulation responsible provider: Myrtis Ser MD, Tinnie Gens.  INR POC: 2.1.  Cuvette Lot#: [id number].  Exp: 10/2010.    Anticoagulation Management Assessment/Plan:      The patient's current anticoagulation dose is Coumadin 5 mg tabs: Take as directed by coumadin clinic..  The target INR is 2 - 3.  The next INR is due 10/13/2009.  Anticoagulation instructions were given to patient.  Results were reviewed/authorized by Cloyde Reams, RN, BSN.  He was notified by Cloyde Reams RN.         Prior Anticoagulation Instructions: INR 1.8 Today take 10mg s then change dose to 7.5mg s everyday. Recheck in 2 1/2 weeks.   Current Anticoagulation Instructions: INR 2.1  Continue on same dosage 7.5mg  daily.  Recheck in 4 weeks.

## 2010-09-02 NOTE — Progress Notes (Signed)
Summary: medication questions   Phone Note Call from Patient Call back at [phone]   Caller: Patient Summary of Call: Pt have question about medications Initial call taken by: Judie Grieve,  May 21, 2010 2:38 PM  Follow-up for Phone Call        Left message to call back Meredith Staggers, RN  May 21, 2010 3:17 PM   pt calling back with med questions [phone] Glynda Jaeger  May 24, 2010 10:31 AM  Pt. wants to get an inversion table for his back pain..? It helps elongate the spine by flipping the pt. upside down for 2-3 min. & pt is concerned b/c he is taking coumadin.  Will forward to Dr. Gala Romney & his RN to see if it is okay for pt. to do this? Whitney Maeola Sarah RN  May 24, 2010 1:16 PM  Follow-up by: Whitney Maeola Sarah RN,  May 24, 2010 1:11 PM  Additional Follow-up for Phone Call Additional follow up Details #1::        OK. Dolores Patty, MD, [facility]  May 26, 2010 5:08 PM  left mess on pts ID vm this is ok Meredith Staggers, RN  May 27, 2010 9:19 AM

## 2010-09-02 NOTE — Assessment & Plan Note (Signed)
Summary: f35m / appt is 9:15/ gd  Medications Added PROSTATE  TABS (SPECIALTY VITAMINS PRODUCTS) 1 by mouth daily      Allergies Added:   History of Present Illness: Mark Gonzalez is a very pleasant male with a history of psoriatic arthritis, obesity, hypertension, and atrial fibrillation.  He is maintained in sinus rhythm on Tikosyn.  He previously has a negative Myoview with an EF of 45-50%.  His ejection fraction was thought to be mildly depressed due to presence of atrial fibrillation.   Doing very well from a cardiac status. No palpitations, dyspnea or CP. No problems with bleeding. No problems with orthopnea, PND. Occasional edema. Blood pressure at home 120-125/70-80s. Having problems with recurrent back pain and sciatica.  Has lost 45 pounds with diet and exercise through Weight [employer] and the 21-minute workout program.    Current Medications (verified): 1)  Lovastatin 20 Mg Tabs (Lovastatin) .... Take One Tablet By Mouth Daily At Bedtime 2)  Enbrel 25 Mg/0.26ml Soln (Etanercept) .... Inject 25mg  Subcutaneously Two Times A Week 3)  Niaspan 500 Mg Cr-Tabs (Niacin (Antihyperlipidemic)) .... Take 1 Tablet By Mouth At Bedtime As Directed Take 1 Tablet Aspirin 1/2 Half Hour Prior To Niaspan 4)  Arthrotec 75 75-200 Mg-Mcg Tabs (Diclofenac-Misoprostol) .... Take 1 Tablet By Mouth Two Times A Day 5)  Tikosyn 500 Mcg Caps (Dofetilide) .... Take 1 Capsule By Mouth Two Times A Day 6)  Diovan 80 Mg Tabs (Valsartan) .... Take One Tablet By Mouth Daily 7)  Adult Aspirin Ec Low Strength 81 Mg Tbec (Aspirin) .... Take 1 Tablet Daily 8)  Multivitamins  Tabs (Multiple Vitamin) .... Take 1 Tablet By Mouth Daily 9)  Fish Oil 1000 Mg Caps (Omega-3 Fatty Acids) .... Take 1 Capsule By Mouth Daily 10)  Coumadin 5 Mg Tabs (Warfarin Sodium) .... Take As Directed By Coumadin Clinic. 11)  Furosemide 20 Mg Tabs (Furosemide) .... Take 1 Tab Every Monday, Wed, and Fri 12)  Potassium Chloride Crys Cr 20 Meq Cr-Tabs  (Potassium Chloride Crys Cr) .... Take 1 Tab Every Monday, Wed, and Fri 13)  Prostate  Tabs (Specialty Vitamins Products) .Marland Kitchen.. 1 By Mouth Daily  Allergies (verified): 1)  ! Lisinopril  Past History:  Past Medical History: Last updated: 07/20/2008 1) Paroxysmal atrial fibrillation       -s/p DC-CV in 2/09 with reversion to AF       -now on Tikosyn and Chronic Coumadin       -EF 60%        -Myoview 2009 EF 45-50% in settinf of AF. No ischemia or scar 2) Morbid obesity 3) Hypertension 4) Psoriatic arthritis  Review of Systems       As per HPI and past medical history; otherwise all systems negative.   Vital Signs:  Patient profile:   50 year old male Height:      72 inches Weight:      355 pounds BMI:     48.32 Pulse rate:   51 / minute Resp:     18 per minute BP sitting:   138 / 90  (right arm)  Vitals Entered By: Marrion Coy, CNA (April 22, 2010 9:01 AM)  Physical Exam  General:   He is in no acute distress, ambulates around the clinic without any respiratory difficulty. HEENT:  Normal. NECK:  Supple and thick.  No obvious JVD.  Carotids are 2+ bilaterally without any bruits.  There is no lymphadenopathy or thyromegaly. CARDIAC:  PMI is not palpable.  He is regular and bradycardic.  No obvious murmurs. LUNGS:  Clear. ABDOMEN:  Obese, nontender, and nondistended.   EXTREMITIES:  Warm with no cyanosis, clubbing, or edema.  No rash.  Good pulses. NEURO:  Alert and oriented x3.  Cranial nerves II through XII are intact.  He moves all fours without difficulty.  Affect is pleasant.    Impression & Recommendations:  Problem # 1:  ATRIAL FIBRILLATION (ICD-427.31) Maintaining sinus rhtyhm on Tikosyn. tolerating coumadin well. We did discuss Pradaxa but he has been satble on coumadin so will continue. Recheck echo to make sure ef back to normal.  Problem # 2:  ESSENTIAL HYPERTENSION, BENIGN (ICD-401.1) Blood pressure well controlled. Continue current  regimen.  Problem # 3:  HYPERLIPIDEMIA-MIXED (ICD-272.4) Due for repeat lipids and liver panel. Had discussion about Niaspan and AIM-HIGH trial  will continue for now.   Other Orders: EKG w/ Interpretation (93000) Echocardiogram (Echo) TLB-BMP (Basic Metabolic Panel-BMET) (80048-METABOL) TLB-Hepatic/Liver Function Pnl (80076-HEPATIC) TLB-Lipid Panel (80061-LIPID)  Patient Instructions: 1)  Labs today 2)  Your physician has requested that you have an echocardiogram.  Echocardiography is a painless test that uses sound waves to create images of your heart. It provides your doctor with information about the size and shape of your heart and how well your heart's chambers and valves are working.  This procedure takes approximately one hour. There are no restrictions for this procedure. 3)  Your physician wants you to follow-up in:  6 months.  You will receive a reminder letter in the mail two months in advance. If you don't receive a letter, please call our office to schedule the follow-up appointment.

## 2010-09-02 NOTE — Progress Notes (Signed)
Summary: refill   Phone Note Refill Request Message from:  Patient on September 25, 2009 3:49 PM  Refills Requested: Medication #1:  TIKOSYN 500 MCG CAPS take 1 capsule by mouth two times a day   Supply Requested: 6 months [facility] [phone]   Method Requested: Fax to Local Pharmacy Initial call taken by: Migdalia Dk,  September 25, 2009 3:49 PM    Prescriptions: TIKOSYN 500 MCG CAPS (DOFETILIDE) take 1 capsule by mouth two times a day  #60 Capsule x 1   Entered by:   Judithe Modest CMA   Authorized by:   Nathen May, MD, [facility]   Signed by:   Judithe Modest CMA on 09/25/2009   Method used:   Electronically to        [facility]. [phone]* (retail)       [phone] [address]       [place], [place]  [address]       Ph: [phone]       Fax: [phone]   RxID:   [id number]

## 2010-09-02 NOTE — Medication Information (Signed)
Summary: rov/tm  Anticoagulant Therapy  Managed by: Bethena Midget, RN Referring MD: Arvilla Meres Supervising MD: [name], Dalton Indication 1: Atrial Fibrillation (ICD-427.31) Lab Used: [facility] Site: [facility] INR POC 1.8 INR RANGE 2 - 3  Dietary changes: yes       Details: Has been eating more salads, broccoli, spinach than usual.  Health status changes: no    Bleeding/hemorrhagic complications: no    Recent/future hospitalizations: no    Any changes in medication regimen? no    Recent/future dental: no  Any missed doses?: no       Is patient compliant with meds? yes       Allergies: 1)  ! Lisinopril  Anticoagulation Management History:      The patient is taking warfarin and comes in today for a routine follow up visit.  Negative risk factors for bleeding include an age less than 23 years old.  The bleeding index is 'low risk'.  Positive CHADS2 values include History of HTN.  Negative CHADS2 values include Age > 11 years old.  The start date was 08/06/2007.  His last INR was 4.9 ratio.  Anticoagulation responsible provider: [name], Dalton.  INR POC: 1.8.  Cuvette Lot#: [id number].  Exp: 10/2010.    Anticoagulation Management Assessment/Plan:      The patient's current anticoagulation dose is Coumadin 5 mg tabs: Take as directed by coumadin clinic..  The target INR is 2 - 3.  The next INR is due 08/28/2009.  Anticoagulation instructions were given to patient.  Results were reviewed/authorized by Bethena Midget, RN.  He was notified by Lew Dawes, PharmD Candidate.         Prior Anticoagulation Instructions: INR 2.3 Continue 7.5mg s everyday except 5mg s on Sundays and Wednesdays. Recheck in 4 weeks.   Current Anticoagulation Instructions: INR 1.8  Increase dose to 1.5 tablets daily except 1 tablet on Wednesdays. Pt is increasing his intake of Vitamin K. Recheck 1/28.

## 2010-09-02 NOTE — Medication Information (Signed)
Summary: rov/sp  Anticoagulant Therapy  Managed by: Cloyde Reams, RN, BSN Referring MD: Arvilla Meres Supervising MD: Tenny Craw MD, Gunnar Fusi Indication 1: Atrial Fibrillation (ICD-427.31) Lab Used: [facility] Site: [facility] INR POC 2.5 INR RANGE 2 - 3  Dietary changes: no    Health status changes: no    Bleeding/hemorrhagic complications: no    Recent/future hospitalizations: no    Any changes in medication regimen? yes       Details: Currently on Ceftin and Zyrtec D.    Recent/future dental: no  Any missed doses?: no       Is patient compliant with meds? yes       Allergies: 1)  ! Lisinopril  Anticoagulation Management History:      The patient is taking warfarin and comes in today for a routine follow up visit.  Negative risk factors for bleeding include an age less than 77 years old.  The bleeding index is 'low risk'.  Positive CHADS2 values include History of HTN.  Negative CHADS2 values include Age > 51 years old.  The start date was 08/06/2007.  His last INR was 4.9 ratio.  Anticoagulation responsible provider: Tenny Craw MD, Gunnar Fusi.  INR POC: 2.5.  Cuvette Lot#: [id number].  Exp: 09/2011.    Anticoagulation Management Assessment/Plan:      The patient's current anticoagulation dose is Coumadin 5 mg tabs: Take as directed by coumadin clinic..  The target INR is 2 - 3.  The next INR is due 09/02/2010.  Anticoagulation instructions were given to patient.  Results were reviewed/authorized by Cloyde Reams, RN, BSN.  He was notified by Cloyde Reams RN.         Prior Anticoagulation Instructions: INR 2.7  Continue same dose of 1 1/2 tablets every day except 1 tablet on Friday.  Recheck INR in 4 weeks.   Current Anticoagulation Instructions: INR 2.5  Continue on same dosage 1.5 tablets daily except 1 tablet on Fridays.  Recheck in 4 weeks.

## 2010-09-02 NOTE — Medication Information (Signed)
Summary: rov/ewj  Anticoagulant Therapy  Managed by: Weston Brass, PharmD Referring MD: Arvilla Meres Supervising MD: [name], Freida Busman Indication 1: Atrial Fibrillation (ICD-427.31) Lab Used: [facility] Site: [facility] INR POC 2.5 INR RANGE 2 - 3  Dietary changes: no    Health status changes: no    Bleeding/hemorrhagic complications: no    Recent/future hospitalizations: no    Any changes in medication regimen? yes       Details: added Rapaflo 8mg    Recent/future dental: no  Any missed doses?: no       Is patient compliant with meds? yes       Current Medications (verified): 1)  Lovastatin 20 Mg Tabs (Lovastatin) .... Take One Tablet By Mouth Daily At Bedtime 2)  Enbrel 25 Mg/0.46ml Soln (Etanercept) .... Inject 25mg  Subcutaneously Two Times A Week 3)  Niaspan 500 Mg Cr-Tabs (Niacin (Antihyperlipidemic)) .... Take 1 Tablet By Mouth At Bedtime As Directed Take 1 Tablet Aspirin 1/2 Half Hour Prior To Niaspan 4)  Arthrotec 75 75-200 Mg-Mcg Tabs (Diclofenac-Misoprostol) .... Take 1 Tablet By Mouth Two Times A Day 5)  Tikosyn 500 Mcg Caps (Dofetilide) .... Take 1 Capsule By Mouth Two Times A Day 6)  Diovan 80 Mg Tabs (Valsartan) .... Take One Tablet By Mouth Daily 7)  Adult Aspirin Ec Low Strength 81 Mg Tbec (Aspirin) .... Take 1 Tablet Daily 8)  Multivitamins  Tabs (Multiple Vitamin) .... Take 1 Tablet By Mouth Daily 9)  Fish Oil 1000 Mg Caps (Omega-3 Fatty Acids) .... Take 1 Capsule By Mouth Daily 10)  Coumadin 5 Mg Tabs (Warfarin Sodium) .... Take As Directed By Coumadin Clinic. 11)  Furosemide 20 Mg Tabs (Furosemide) .... Take 1 Tab Every Monday, Wed, and Fri 12)  Potassium Chloride Crys Cr 20 Meq Cr-Tabs (Potassium Chloride Crys Cr) .... Take 1 Tab Every Monday, Wed, and Fri 13)  Rapaflo 8 Mg Caps (Silodosin) .... Take 1 Capsule By Mouth Daily  Allergies: 1)  ! Lisinopril  Anticoagulation Management History:      The patient is taking warfarin and comes in today  for a routine follow up visit.  Negative risk factors for bleeding include an age less than 67 years old.  The bleeding index is 'low risk'.  Positive CHADS2 values include History of HTN.  Negative CHADS2 values include Age > 35 years old.  The start date was 08/06/2007.  His last INR was 4.9 ratio.  Anticoagulation responsible provider: [name], Mark Gonzalez.  INR POC: 2.5.  Cuvette Lot#: [id number].  Exp: 11/2010.    Anticoagulation Management Assessment/Plan:      The patient's current anticoagulation dose is Coumadin 5 mg tabs: Take as directed by coumadin clinic..  The target INR is 2 - 3.  The next INR is due 12/08/2009.  Anticoagulation instructions were given to patient.  Results were reviewed/authorized by Weston Brass, PharmD.  He was notified by Weston Brass PharmD.         Prior Anticoagulation Instructions: INR 2.3  Continue on same dosage 7.5mg  daily.  Recheck in 4 weeks.    Current Anticoagulation Instructions: INR 2.5  Continue same dose of 7.5mg  (1 1/2 tablets) every day

## 2010-09-02 NOTE — Letter (Signed)
Summary: Appointment - Reschedule  [facility], Main Office  [address]   [place], [place] [address]   Phone: [phone]  Fax: [phone]     March 15, 2010 MRN: [id number]   [facility] [address] [place], [place]  [address]   Dear Mr. MCCOLLAM,   Due to a change in our office schedule, your appointment on  03-25-2010 at 10:00               must be changed.  It is very important that we reach you to reschedule this appointment. We look forward to participating in your health care needs. Please contact us at the number listed above at your earliest convenience to reschedule this appointment.     Sincerely,        Lorne Skeens  [facility] Scheduling Team

## 2010-09-02 NOTE — Medication Information (Signed)
Summary: rov/nb   Anticoagulant Therapy  Managed by: Weston Brass, PharmD Referring MD: Arvilla Meres Supervising MD: Gala Romney MD, Reuel Boom Indication 1: Atrial Fibrillation (ICD-427.31) Lab Used: [facility] Site: [facility] INR POC 2.7 INR RANGE 2 - 3  Dietary changes: yes       Details: decreased greens  Health status changes: no    Bleeding/hemorrhagic complications: no    Recent/future hospitalizations: no    Any changes in medication regimen? no    Recent/future dental: no  Any missed doses?: no       Is patient compliant with meds? yes       Allergies: 1)  ! Lisinopril  Anticoagulation Management History:      The patient is taking warfarin and comes in today for a routine follow up visit.  Negative risk factors for bleeding include an age less than 55 years old.  The bleeding index is 'low risk'.  Positive CHADS2 values include History of HTN.  Negative CHADS2 values include Age > 43 years old.  The start date was 08/06/2007.  His last INR was 4.9 ratio.  Anticoagulation responsible provider: Steen Bisig MD, Reuel Boom.  INR POC: 2.7.  Cuvette Lot#: [id number].  Exp: 05/2011.    Anticoagulation Management Assessment/Plan:      The patient's current anticoagulation dose is Coumadin 5 mg tabs: Take as directed by coumadin clinic..  The target INR is 2 - 3.  The next INR is due 08/05/2010.  Anticoagulation instructions were given to patient.  Results were reviewed/authorized by Weston Brass, PharmD.  He was notified by Weston Brass PharmD.         Prior Anticoagulation Instructions: INR 2.5 Continue previous dose of 1.5 tablets everyday except 1 tablet on Friday Recheck INR in 4 weeks.  Current Anticoagulation Instructions: INR 2.7  Continue same dose of 1 1/2 tablets every day except 1 tablet on Friday.  Recheck INR in 4 weeks.

## 2010-09-08 NOTE — Medication Information (Signed)
Summary: rov/ewj   Anticoagulant Therapy  Managed by: Georgina Pillion, PharmD Referring MD: Arvilla Meres Supervising MD: Tenny Craw MD, Gunnar Fusi Indication 1: Atrial Fibrillation (ICD-427.31) Lab Used: [facility] Site: [facility] INR POC 2.5 INR RANGE 2 - 3  Dietary changes: no    Health status changes: no     Recent/future hospitalizations: no    Any changes in medication regimen? no    Recent/future dental: no  Any missed doses?: no       Is patient compliant with meds? yes       Allergies: 1)  ! Lisinopril  Anticoagulation Management History:      Negative risk factors for bleeding include an age less than 19 years old.  The bleeding index is 'low risk'.  Positive CHADS2 values include History of HTN.  Negative CHADS2 values include Age > 73 years old.  The start date was 08/06/2007.  His last INR was 4.9 ratio.  Anticoagulation responsible provider: Tenny Craw MD, Gunnar Fusi.  INR POC: 2.5.  Exp: 09/2011.    Anticoagulation Management Assessment/Plan:      The patient's current anticoagulation dose is Coumadin 5 mg tabs: Take as directed by coumadin clinic..  The target INR is 2 - 3.  The next INR is due 10/07/2010.  Anticoagulation instructions were given to patient.  Results were reviewed/authorized by Georgina Pillion, PharmD.         Prior Anticoagulation Instructions: INR 2.5  Continue on same dosage 1.5 tablets daily except 1 tablet on Fridays.  Recheck in 4 weeks.    Current Anticoagulation Instructions: Continue taking your coumadin as scheduled with with 7.5 mg (1 1/2 tablets) daily except for 5 mg (1 tablet) on Fridays only.  INR 2.5

## 2010-09-17 ENCOUNTER — Encounter: Payer: Self-pay | Admitting: Cardiovascular Disease

## 2010-09-22 NOTE — Miscellaneous (Signed)
Summary: Orders Update  Clinical Lists Changes  Orders: Added new Test order of Venous Duplex Lower Extremity (Venous Duplex Lower) - Signed 

## 2010-09-27 ENCOUNTER — Encounter: Payer: Self-pay | Admitting: Internal Medicine

## 2010-09-27 DIAGNOSIS — I4891 Unspecified atrial fibrillation: Secondary | ICD-10-CM

## 2010-10-07 ENCOUNTER — Encounter: Payer: Self-pay | Admitting: Cardiology

## 2010-10-07 ENCOUNTER — Encounter (INDEPENDENT_AMBULATORY_CARE_PROVIDER_SITE_OTHER): Payer: BC Managed Care – PPO

## 2010-10-07 DIAGNOSIS — Z7901 Long term (current) use of anticoagulants: Secondary | ICD-10-CM

## 2010-10-07 DIAGNOSIS — I4891 Unspecified atrial fibrillation: Secondary | ICD-10-CM

## 2010-10-07 LAB — CONVERTED CEMR LAB: POC INR: 2.4

## 2010-10-12 NOTE — Medication Information (Signed)
Summary: rov/sp   Anticoagulant Therapy  Managed by: Weston Brass, PharmD Referring MD: Arvilla Meres Supervising MD: Myrtis Ser MD, Tinnie Gens Indication 1: Atrial Fibrillation (ICD-427.31) Lab Used: [facility] Site: [facility] INR POC 2.4 INR RANGE 2 - 3  Dietary changes: no    Health status changes: no    Bleeding/hemorrhagic complications: no    Recent/future hospitalizations: no    Any changes in medication regimen? no    Recent/future dental: no  Any missed doses?: no       Is patient compliant with meds? yes       Allergies: 1)  ! Lisinopril  Anticoagulation Management History:      The patient is taking warfarin and comes in today for a routine follow up visit.  Negative risk factors for bleeding include an age less than 51 years old.  The bleeding index is 'low risk'.  Positive CHADS2 values include History of HTN.  Negative CHADS2 values include Age > 5 years old.  The start date was 08/06/2007.  His last INR was 4.9 ratio.  Anticoagulation responsible provider: Myrtis Ser MD, Tinnie Gens.  INR POC: 2.4.  Cuvette Lot#: [id number].  Exp: 08/2011.    Anticoagulation Management Assessment/Plan:      The patient's current anticoagulation dose is Coumadin 5 mg tabs: Take as directed by coumadin clinic..  The target INR is 2 - 3.  The next INR is due 11/04/2010.  Anticoagulation instructions were given to patient.  Results were reviewed/authorized by Weston Brass, PharmD.  He was notified by Weston Brass PharmD.         Prior Anticoagulation Instructions: Continue taking your coumadin as scheduled with with 7.5 mg (1 1/2 tablets) daily except for 5 mg (1 tablet) on Fridays only.  INR 2.5  Current Anticoagulation Instructions: INR 2.4  Continue same dose of 1 1/2 tablets every day except 1 tablet on Friday.  Recheck INR in 4 weeks.

## 2010-11-04 ENCOUNTER — Ambulatory Visit (INDEPENDENT_AMBULATORY_CARE_PROVIDER_SITE_OTHER): Payer: BC Managed Care – PPO | Admitting: *Deleted

## 2010-11-04 DIAGNOSIS — I4891 Unspecified atrial fibrillation: Secondary | ICD-10-CM

## 2010-11-04 NOTE — Patient Instructions (Addendum)
INR 3.3 Skip your dose today. Then resume normal dosing schedule of 1.5 tablets everyday except 1 tablet on Friday.

## 2010-11-17 ENCOUNTER — Ambulatory Visit (INDEPENDENT_AMBULATORY_CARE_PROVIDER_SITE_OTHER): Payer: BC Managed Care – PPO | Admitting: *Deleted

## 2010-11-17 DIAGNOSIS — I4891 Unspecified atrial fibrillation: Secondary | ICD-10-CM

## 2010-11-18 ENCOUNTER — Encounter: Payer: BC Managed Care – PPO | Admitting: *Deleted

## 2010-11-25 ENCOUNTER — Encounter: Payer: Self-pay | Admitting: *Deleted

## 2010-11-25 ENCOUNTER — Encounter: Payer: Self-pay | Admitting: Internal Medicine

## 2010-11-25 ENCOUNTER — Ambulatory Visit (INDEPENDENT_AMBULATORY_CARE_PROVIDER_SITE_OTHER): Payer: BC Managed Care – PPO | Admitting: Internal Medicine

## 2010-11-25 ENCOUNTER — Telehealth: Payer: Self-pay | Admitting: Internal Medicine

## 2010-11-25 VITALS — BP 120/76 | HR 110 | Ht 72.0 in | Wt 355.0 lb

## 2010-11-25 DIAGNOSIS — I4891 Unspecified atrial fibrillation: Secondary | ICD-10-CM

## 2010-11-25 LAB — CBC WITH DIFFERENTIAL/PLATELET
Basophils Absolute: 0 10*3/uL (ref 0.0–0.1)
Eosinophils Absolute: 0.1 10*3/uL (ref 0.0–0.7)
Lymphocytes Relative: 35.8 % (ref 12.0–46.0)
MCHC: 35.3 g/dL (ref 30.0–36.0)
Neutrophils Relative %: 51.6 % (ref 43.0–77.0)
RDW: 13.3 % (ref 11.5–14.6)

## 2010-11-25 LAB — BASIC METABOLIC PANEL
BUN: 19 mg/dL (ref 6–23)
CO2: 23 mEq/L (ref 19–32)
Calcium: 9 mg/dL (ref 8.4–10.5)
Creatinine, Ser: 0.8 mg/dL (ref 0.4–1.5)
Glucose, Bld: 101 mg/dL — ABNORMAL HIGH (ref 70–99)

## 2010-11-25 LAB — APTT: aPTT: 39.7 s — ABNORMAL HIGH (ref 21.7–28.8)

## 2010-11-25 LAB — PROTIME-INR: INR: 2.8 ratio — ABNORMAL HIGH (ref 0.8–1.0)

## 2010-11-25 NOTE — Telephone Encounter (Signed)
Pt seen by Dr Gala Romney this AM, [facility] sch for tom at 1pm

## 2010-11-25 NOTE — Assessment & Plan Note (Signed)
This is his first episode of breakthrough, symptomatic AF on Tikosyn. Hopefully will revert on his own. If not, will plan DC-CV tomorrow as INRs all therapeutic.

## 2010-11-25 NOTE — Telephone Encounter (Signed)
Pt feels like he is out of rhythm he [place] unsure what his heart rate is, he states he tried to check his pulse but just isn't able, he feels he has been out since at least yest. Maybe longer, he is on coumadin, he will come by office this am for an EKG

## 2010-11-25 NOTE — Progress Notes (Signed)
HPI:  Mark Gonzalez is a very pleasant 50 y/o male with a history of psoriatic arthritis, obesity, hypertension, and atrial fibrillation.  He is maintained in sinus rhythm on Tikosyn.  He previously has a negative Myoview with an EF of 45-50%.  His ejection fraction was thought to be mildly depressed due to presence of atrial fibrillation.  Returns today for unscheduled f/u. C/o palpitations and feeling weak. Started a day or two ago. Also feels tightness in his chest. No edema, orthopnea or PND.  ECG shows recurrent AF. Has been compliant with Tikosyn. INRs all therapeutic.   ROS: All systems negative except as listed in HPI, PMH and Problem List.  Past Medical History  Diagnosis Date  . Paroxysmal atrial fibrillation     s/p DCCV 09/2007  and 11/2007 with reversion to AF, now on Tikosyn and chronic coumadin, EF 60%, myoview 2009 EF 45-50% in setting of AF, no ischemia or scar  . Morbid obesity   . Hypertension   . Psoriatic arthritis     Current Outpatient Prescriptions  Medication Sig Dispense Refill  . aspirin 81 MG tablet Take 81 mg by mouth daily.        . diclofenac-misoprostol (ARTHROTEC 75) 75-0.2 MG per tablet Take 1 tablet by mouth 2 (two) times daily.        Marland Kitchen dofetilide (TIKOSYN) 500 MCG capsule Take 500 mcg by mouth 2 (two) times daily.        . Etanercept (ENBREL) 25 MG/0.5ML SOLN Inject 25 mg into the skin 2 (two) times a week.        . furosemide (LASIX) 20 MG tablet Take 20 mg by mouth. Take 1 tab every Mon, Wed, and Fri       . lovastatin (MEVACOR) 20 MG tablet Take 20 mg by mouth at bedtime.        . Multiple Vitamin (MULTIVITAMIN) tablet Take 1 tablet by mouth daily.        . niacin (NIASPAN) 500 MG CR tablet Take 500 mg by mouth at bedtime.        . Omega-3 Fatty Acids (FISH OIL) 1200 MG CAPS Take 1 capsule by mouth 2 (two) times daily.        . potassium chloride SA (K-DUR,KLOR-CON) 20 MEQ tablet Take 20 mEq by mouth. Take 1 tab every Mon, Wed and Fri       . valsartan  (DIOVAN) 80 MG tablet Take 80 mg by mouth daily.        Marland Kitchen warfarin (COUMADIN) 5 MG tablet Take by mouth as directed.           PHYSICAL EXAM: Filed Vitals:   11/25/10 1111  BP: 120/76  Pulse: 110   General:   He is in no acute distress, ambulates around the clinic without any respiratory difficulty. HEENT:  Normal. NECK:  Supple and thick.  No obvious JVD.  Carotids are 2+ bilaterally without any bruits.  There is no lymphadenopathy or thyromegaly. CARDIAC:  PMI is not palpable.  He is irregular and tachycardic.  No obvious murmurs. LUNGS:  Clear. ABDOMEN:  Obese, nontender, and nondistended.   EXTREMITIES:  Warm with no cyanosis, clubbing, or edema.  No rash.  Good pulses. NEURO:  Alert and oriented x3.  Cranial nerves II through XII are intact.  He moves all fours without difficulty.  Affect is pleasant.   ECG:   AF 110 No ST-T wave abnormalities.     ASSESSMENT & PLAN:

## 2010-11-25 NOTE — Patient Instructions (Signed)
Labs today Your physician has recommended that you have a Cardioversion (DCCV). Electrical Cardioversion uses a jolt of electricity to your heart either through paddles or wired patches attached to your chest. This is a controlled, usually prescheduled, procedure. Defibrillation is done under light anesthesia in the hospital, and you usually go home the day of the procedure. This is done to get your heart back into a normal rhythm. You are not awake for the procedure. Please see the instruction sheet given to you today. Keep follow up appointment as scheduled

## 2010-11-25 NOTE — Telephone Encounter (Signed)
Pt out of rhythm and wants to talk to nurse, denies any other symptoms

## 2010-11-26 ENCOUNTER — Telehealth: Payer: Self-pay | Admitting: Internal Medicine

## 2010-11-26 ENCOUNTER — Ambulatory Visit (HOSPITAL_COMMUNITY)
Admission: RE | Admit: 2010-11-26 | Payer: BC Managed Care – PPO | Source: Ambulatory Visit | Admitting: Internal Medicine

## 2010-11-26 DIAGNOSIS — I4891 Unspecified atrial fibrillation: Secondary | ICD-10-CM

## 2010-11-26 NOTE — Telephone Encounter (Signed)
Pt feels like he is back in normal rhythm he will come by office for EKG to confirm

## 2010-11-26 NOTE — Telephone Encounter (Signed)
Pt had EKG he is in NSR HR 60, DCCV cancelled, Dr Gala Romney aware, Crystal in Short Stay is aware

## 2010-11-26 NOTE — Telephone Encounter (Signed)
Pt scheduled for cardioversion today.  Heart rate has returned to normal and he does not need to go.

## 2010-11-30 ENCOUNTER — Encounter: Payer: Self-pay | Admitting: Internal Medicine

## 2010-12-14 NOTE — Assessment & Plan Note (Signed)
[facility]                            CARDIOLOGY OFFICE NOTE   Mark Gonzalez, Mark Gonzalez                         MRN:          [id number]  DATE:10/11/2007                            DOB:          19-Jul-1961    RHEUMATOLOGIST:  Dr. Chase Picket.   INTERVAL HISTORY:  Mark Gonzalez is a very pleasant 50 year old male with a  history of psoriatic arthritis and morbid obesity as well as  hypertension.  I first saw him back in January when he is found to have  asymptomatic atrial fibrillation.  He underwent electrical cardioversion  in early February and February returns today for routine followup.   He says after his cardioversion he felt quite sluggish but now is  feeling great.  Denies any chest pain or shortness of breath.  He is  back to riding his bike he is dedicated to losing weight.  He has not  had any palpitations, syncope or presyncope.  No chest pain.  Current  medications are Enbrel, Arthrotec, Resveratrol,  milk thistle, coenzyme  Q 10, prostate support, multivitamin, borage oil, B50 complex, fish oil  2 grams a day, Cardizem 240 a day and aspirin 81 a day, as well as  Coumadin.   PHYSICAL EXAM:  He is in no acute distress, ambulates around the clinic  without respiratory difficulty.  Blood pressure is 158/95 are heart rate is 54, weights 367.  HEENT is normal.  Neck is supple with no obvious JVD.  A bit hard to examine.  Carotid 2+  bilaterally without bruits.  There is no lymphadenopathy or thyromegaly.  CARDIAC:  PMI is nondisplaced.  He is regular with no murmurs, rubs or  gallops.  LUNGS:  Clear.  Abdomen obese, nontender, nondistended.  Limited exam due to his size.  No masses.  Good bowel sounds.  Unable to appreciate hepatosplenomegaly.  EXTREMITIES:  Warm.  No cyanosis clubbing or edema.  No rash.  NEURO:  Alert and oriented x3.  Cranial nerves II-XII are intact.  Moves  all fours extremities without difficulty.   EKG shows sinus  bradycardia rate of 54 no ST-T wave abnormalities.   ASSESSMENT/PLAN:  1. Paroxysmal atrial fibrillation.  He is maintaining sinus rhythm      after his cardioversion.  Given his CHADS score is only 1, we will      take him off his Coumadin put him on full-dose aspirin.  2. Hypertension.  This is poorly controlled.  Add lisinopril      hydrochlorothiazide 20/12.5.  Will check BMET in 1 week.  3. Obesity.  He is dedicated to rejoining [employer] and losing      weight.   DISPOSITION:  Will see him back in 4 months for routine followup.  Of  note, given his sleep apnea and obesity, we did talk about the  possibility of obstructive sleep apnea but he says his  wife is a very light sleeper and she has never noticed that he stopped  breathing or that he snores heavily at night.  We will continue to  follow.  Mark Buckles. Bensimhon, MD  Electronically Signed    DRB/MedQ  DD: 10/11/2007  DT: 10/12/2007  Job #: [id number]   cc:   Mark Gonzalez, M.D.

## 2010-12-14 NOTE — Cardiovascular Report (Signed)
Mark Gonzalez, WAILES NO.:  [id number]   MEDICAL RECORD NO.:  [id number]          PATIENT TYPE:  INP   LOCATION:  2004                         FACILITY:  [facility]   PHYSICIAN:  Jonelle Sidle, MD DATE OF BIRTH:  Sep 01, 1960   DATE OF PROCEDURE:  DATE OF DISCHARGE:                            CARDIAC CATHETERIZATION   INDICATIONS:  [name] is a 50 year old male with a history of atrial  fibrillation status post previous transesophageal echocardiogram guided  cardioversion with successful restoration of normal sinus rhythm.  He  unfortunately reverted ultimately to atrial fibrillation and is referred  back for repeat attempt at cardioversion preceded by transesophageal  echocardiogram.  He has been on Coumadin and did have one recent  subtherapeutic INR level of 1.6 on Dec 17, 2007.  Subsequent INR on Dec 21, 2007, was 3.5.  The potential risks and benefits of the procedure  were explained to him in advance and informed consent was obtained.   A transesophageal echocardiogram was performed in advance of  cardioversion.  Please see the full report.  Overall, left ventricle  systolic function was normal and there was no left atrial appendage  thrombus.   Following this, anterior and posterior pads were placed and with the  assistance of the anesthesia service, adequate sedation was obtained.  Using a biphasic defibrillator, a single shock at 150 joules was  delivered.  This did not restore atrial fibrillation.  Following this, a  single shock at 200 joules was delivered with successful restoration of  normal sinus rhythm.  The patient tolerated the procedure well.  There  were no obvious complications.   DISPOSITION:  The patient is being admitted to the hospital following  this procedure in anticipation of loading with Tikosyn under the  direction of Dr. Gala Romney.      Jonelle Sidle, MD  Electronically Signed     SGM/MEDQ  D:  12/25/2007  T:   12/26/2007  Job:  [id number]

## 2010-12-14 NOTE — Discharge Summary (Signed)
NAMEVERNICE, BOWKER                ACCOUNT NO.:  [id number]   MEDICAL RECORD NO.:  [id number]          PATIENT TYPE:  INP   LOCATION:  2004                         FACILITY:  [facility]   PHYSICIAN:  Jonelle Sidle, MD DATE OF BIRTH:  1960/10/02   DATE OF ADMISSION:  12/25/2007  DATE OF DISCHARGE:                         DISCHARGE SUMMARY - REFERRING   FINAL DISCHARGE DIAGNOSES:  1. Paroxysmal atrial fibrillation.  2. Tikosyn initiation.   SECONDARY DIAGNOSES:  1. Obesity.  2. Multiple over-the-counter supplementations with unclear reaction to      Tikosyn.   BRIEF HISTORY:  Mr. Ketchum is a 50 year old male with a history of  recurrent atrial fibrillation.  Recent Myoview in March showed an EF of  40% to 50% without ischemia.  He states that he just has not felt right  and felt more short of breath. He had undergone a TEE cardioversion in  March.  He was admitted to the hospital following repeat TEE  cardioversion with plan for Tikosyn initiation.   LABORATORY DATA:  Admission weight is not documented.  On the 27th, PT  is 24.7, INR 2.2; sodium 139, potassium 4.2, BUN 11, creatinine 0.72,  magnesium 2.3.  At the time of discharge on the 29th, PT/INR is 25.5,  INR 2.2; sodium 139, potassium 4.0, BUN 12, creatinine 0.75, glucose 95.   EKGs throughout his admission did not show any problems with QTc  prolongation.  He remained in sinus bradycardia.  At the time of  discharge at approximately 10:25, his QTc interval was 427.   HOSPITAL COURSE:  The patient was admitted to [facility]  following successful TEE cardioversion to sinus rhythm.  He was  continued on his home medications; however, his hydrochlorothiazide was  discontinued due to contraindication with Tikosyn.  Electrolytes were  within normal limits and Tikosyn 500 mg b.i.d. was started.  Post  Tikosyn dosing, EKGs did not show any significant prolonged QTc  interval.  By the 29th, he was doing well.  He did not  have any problems  on telemetry.   DISPOSITION:  He is discharged home, asked to maintain a low-sodium,  heart-healthy diet.   DISCHARGE MEDICATIONS:  1. New prescriptions were given to him for Tikosyn 500 mg every 12      hours; a 2-week supply was provided by the pharmacy and [facility]      [employer] mailed in the form.  2. His Cardizem CD was decreased to 180 mg daily.  3. He received a prescription for lisinopril 20 mg daily.  4. He was also asked to take a low-dose aspirin 81 mg daily.  5. His Coumadin will be continued at 5 mg daily, except 7.5 mg Monday,      Wednesday and Friday.  6. Enbrel 25-mg injection twice a week as previously.  7. Arthrotec 75 mg b.i.d. as previously.  8. He was given permission to continue fish oil 1000 mg b.i.d. and a      multivitamin daily; however, he was advised specifically not to      take his combination lisinopril/hydrochlorothiazide as well as his  supplements including for borage oil, liver detox, MSM, prostate      support, B50 complex, CoQ10 until discussed with Dr. Gala Romney.      Prior to discharge, Pharmacy looked this supplements up in the      medical data base and there is not any documentation on the effects      that these may have associated with Tikosyn.   FOLLOWUP:  He has a PT/INR scheduled on January 24, 2008 at 8:30, after his  appointment with Dr. Gala Romney at 8 a.m. He will have any EKG on January 04, 2008 at 10:30 to assess the QTc interval and he was asked to bring all  medicines, including his supplements to all appointments.   DISCHARGE TIME:  Forty-five minutes.      Joellyn Rued, PA-C      Jonelle Sidle, MD  Electronically Signed    EW/MEDQ  D:  12/28/2007  T:  12/28/2007  Job:  [id number]   cc:   Bevelyn Buckles. Bensimhon, MD  Areatha Keas, M.D.

## 2010-12-14 NOTE — Op Note (Signed)
NAMENIKOS, ANGLEMYER                ACCOUNT NO.:  [id number]   MEDICAL RECORD NO.:  [id number]          PATIENT TYPE:  OIB   LOCATION:  2864                         FACILITY:  [facility]   PHYSICIAN:  Bevelyn Buckles. Bensimhon, MDDATE OF BIRTH:  [birth year]   DATE OF PROCEDURE:  09/03/2007  DATE OF DISCHARGE:                               OPERATIVE REPORT   This is a direct current cardioversion.   PATIENT IDENTIFICATION:  Mark Gonzalez is a very pleasant 50 year old male  with a history of morbid obesity and psoriatic arthritis.  He was  recently found to be in atrial fibrillation.  We saw him in clinic and  decided to attempt cardioversion.  He has been on chronic  anticoagulation with the last INR of 2.9.  He underwent TEE which showed  normal LV function with EF of 60%, no significant valve abnormalities  except for mild mitral regurgitation.  Left atrial appendage did not  show any thrombus.   After the TEE, he was further sedated by Dr. Sheldon Silvan with 375 mg of  sodium Pentothal.  Once adequate sedation was achieved, he received a  single 200 joules biphasic shock in a synchronized fashion with prompt  conversion to sinus rhythm.  There are no apparent complications.      Bevelyn Buckles. Bensimhon, MD  Electronically Signed     DRB/MEDQ  D:  09/03/2007  T:  09/03/2007  Job:  [id number]   cc:   Areatha Keas, M.D.

## 2010-12-14 NOTE — Assessment & Plan Note (Signed)
[facility]                            CARDIOLOGY OFFICE NOTE   Gonzalez, Mark                         MRN:          [id number]  DATE:08/09/2007                            DOB:          Jan 11, 1961    REFERRING PHYSICIAN:  Areatha Keas, M.D.   REASON FOR CONSULTATION:  Atrial fibrillation.   HISTORY OF PRESENT ILLNESS:  [name] is a very pleasant 50 year old  male with a history of psoriatic arthritis and morbid obesity,  borderline hypertension.  He denies any history of known cardiac  disease.  On Christmas Eve he went to see Dr. Phylliss Bob for routine followup  and was found to be in atrial fibrillation with a mildly elevated  ventricular response at 103 beats per minute.  Dr. Phylliss Bob called me.  We  started him on aspirin 325 and Toprol XL 50 mg a day.  He comes in today  to discuss the options.   He denies any chest pain or shortness of breath.  He actually is getting  back on his exercise and diet program and rides his bike on the roads  for over an hour a day without any difficulty.  He denies any syncope or  presyncope, no heart failure symptoms.   CURRENT MEDICATIONS:  1. Enbrel.  2. Arthrotek.  3. Toprol XL 50.  4. Resveratrol.  5. Milk thistle.  6. Coenzyme Q10.  7. Prostate support.  8. Multivitamin.  9. B50 complex.  10.Fish oil.  11.Aspirin 325 a day.  12.Borage oil.   ALLERGIES:  No known drug allergies.   SOCIAL HISTORY:  He is married to his wife for 20 years.  He has one  child.  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  He  actually went to high school with [employer] here in anesthesiology.  Tobacco:  Smoked but quit in 1992.  He drinks 3-4 glasses of red wine a  week.   FAMILY HISTORY:  Denies any history of known premature coronary artery  disease or arrhythmias.   REVIEW OF SYSTEMS:  Notable for arthritis.  The remainder of the review  of systems is negative.  For complete details please see intake  form  which I have reviewed in detail.   PHYSICAL EXAMINATION:  GENERAL:  He is an obese male in no acute  distress.  He ambulates around the clinic without any respiratory  difficulty.  VITAL SIGNS:  Blood pressure is 132/91, heart rate is 96, weight is 357.  HEENT:  Normal.  NECK:  Supple.  There is no obvious JVD.  Carotids are 2 plus  bilaterally without any bruits.  There is no lymphadenopathy or  thyromegaly.  CARDIAC:  PMI is nondisplaced.  He is irregularly irregular.  No  murmurs, rubs, or gallops.  LUNGS:  Clear.  ABDOMEN:  Obese, nontender, nondistended.  I am unable to appreciate any  hepatosplenomegaly given his size.  No obvious masses.  Good bowel  sounds.  EXTREMITIES:  Warm with no cyanosis, clubbing, or edema.  No rash.  NEUROLOGIC:  Alert and oriented x3.  Cranial nerves II-XII are intact.  He moves all 4 extremities without difficulty.  Affect is very pleasant.   EKG shows atrial fibrillation at a rate of 96.  No ST-T wave  abnormalities.   ASSESSMENT/PLAN:  1. Atrial fibrillation.  We had a long talk about the causes and the      treatment.  He is asymptomatic from this.  However, given his young      age, I think he deserves a shock to be put back in normal rhythm.      I have talked to him about just going through 6-8 weeks of      anticoagulation or speeding the process up with a transesophageal      echocardiogram-guided cardioversion and he prefers the later.  We      will start him on Coumadin and have him to the Coumadin clinic.      Once his INR is therapeutic, we will then bring him in for a      transesophageal echocardiogram-guided cardioversion.  In the      interim, we will check an echocardiogram.  His thyroid panel was      checked by Dr. Phylliss Bob and is normal.  He adamantly denies any      significant symptoms of sleep apnea. Thus, I am really unsure of      what triggered his atrial fibrillation.  With regards to      anticoagulation, we will  use Coumadin in the short term.  His CHADS      II score is only 1 and thus in the long term we will just use      aspirin 325.  With regards to rate control currently, the Toprol is      making him feel fatigued, and we will switch him to Cardizem 240 a      day.  2. Hypertension.  Blood pressure is elevated.  He has continued to      work on exercise and weight loss which should help.  The Cardizem      should help as well.   DISPOSITION:  We will see him back in 3 months, after his cardioversion.     Bevelyn Buckles. Bensimhon, MD  Electronically Signed    DRB/MedQ  DD: 08/09/2007  DT: 08/09/2007  Job #: [id number]   cc:   Areatha Keas, M.D.

## 2010-12-14 NOTE — Assessment & Plan Note (Signed)
[facility]                            CARDIOLOGY OFFICE NOTE   AMAHRI, DENGEL                         MRN:          [id number]  DATE:12/12/2007                            DOB:          23-Apr-1961    RHEUMATOLOGIST:  Dr. Estill Bakes.   INTERVAL HISTORY:  [name] is a very pleasant 50 year old male with a  history of psoriatic arthritis, morbid obesity and hypertension.  I  first saw him back in January for atrial fibrillation.  He underwent TEE  guided cardioversion in March.  He returns today for an unscheduled  visit due to recurrent atrial fibrillation.   He says several days ago he just did not feel right.  His chest kind of  felt heavy and he felt somewhat sluggish.  His wife listened to him and  told him that he was irregular and he presents today for evaluation.  He  did have a Myoview back in March which showed no evidence of ischemia.  Echocardiogram showed an EF of 45-50% in the setting of atrial  fibrillation.  He denies any lower extremity edema.  No orthopnea or  PND.  He just does not feel right.  He has been somewhat more short of  breath.   REVIEW OF SYSTEMS:  He has not had any bleeding.  No focal neurologic  signs.  Otherwise, all systems negative other than HPI and problem list.   PHYSICAL EXAMINATION:  GENERAL APPEARANCE:  He is in no acute distress.  He ambulates around the clinic without respiratory difficulty.  VITAL SIGNS:  Blood pressure is 110/76, heart rate 66.  HEENT:  Normal.  NECK:  Supple.  No JVD.  Carotids are 2+ bilaterally without any bruits.  There is no lymphadenopathy or thyromegaly.  CARDIAC:  PMI is nondisplaced.  He is irregularly irregular.  No  murmurs, rubs or gallops.  LUNGS:  Clear.  ABDOMEN:  Obese, nontender, nondistended.  No obvious masses.  Good  bowel sounds.  No bruits.  EXTREMITIES:  Warm.  No cyanosis, clubbing or edema.  He does have  psoriatic patches on the skin.  NEURO:  Alert and  oriented x3.  Cranial nerves II-XII are intact.  Moves  all four extremities without difficulty.  Affect is pleasant.   EKG shows atrial fibrillation at a ventricular rate of 66.   MEDICATION:  Lisinopril/hydrochlorothiazide 20/12.5 daily.   ALLERGIES:  No allergies.   SOCIAL HISTORY:  He is a nonsmoker.  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   ASSESSMENT/PLAN:  Recurrent atrial fibrillation.  This is a somewhat  symptomatic.  We have decided to perform another cardioversion.  At this  point, he will need an antiarrhythmic.  We will admit him in a week for  transesophageal echocardiogram guided cardioversion and he will start  Coumadin today.  Given his mildly reduced left ventricular function, I  think it is probably best to use Tikosyn, however, he cannot tolerate  this.  He may still be a candidate for a 1-C drug like flecainide.     Bevelyn Buckles. Bensimhon,  MD  Electronically Signed    DRB/MedQ  DD: 12/12/2007  DT: 12/12/2007  Job #: [id number]   cc:   Areatha Keas, M.D.

## 2010-12-14 NOTE — Assessment & Plan Note (Signed)
[facility]                            CARDIOLOGY OFFICE NOTE   Mark Gonzalez, Mark Gonzalez                         MRN:          [id number]  DATE:05/23/2008                            DOB:          20-Jun-1961    PRIMARY CARE PHYSICIAN/RHEUMATOLOGIST:  Areatha Keas, MD   INTERVAL HISTORY:  Mark Gonzalez is a very pleasant 50 year old male with a  history of psoriatic arthritis, obesity, hypertension, and atrial  fibrillation.  He is maintained in sinus rhythm on Tikosyn.  He  previously has a negative Myoview with an EF of 45-50%.  His ejection  fraction was thought to be mildly depressed due to presence of atrial  fibrillation.   He returns today for routine followup.  Overall, he is doing quite well.  He denies any palpitations.  No shortness breath or chest pain.  He was  recently riding his bike for about an hour and 15 minutes a day without  any difficulty.  He is frustrated with himself as he has got dull in the  financial market, gained about 20 pounds over the past few weeks due to  not watching his diet closely.   CURRENT MEDICATIONS:  1. Coumadin.  2. Diltiazem 180.  3. Tikosyn 500 mcg twice a day.  4. Aspirin 81.  5. Enbrel.  6. Arthrotec 75 b.i.d.  7. Fish oil 2000 a day.  8. Multivitamin.  9. Diovan 80 a day.  10.Zocor 20 a day.   PHYSICAL EXAMINATION:  GENERAL:  He is in no acute distress, ambulates  around the clinic without any respiratory difficulty.  VITAL SIGNS:  Blood pressure is 128/84, heart rate is 50, and weight is  376.  HEENT:  Normal.  NECK:  Supple and thick.  No obvious JVD.  Carotids are 2+ bilaterally  without any bruits.  There is no lymphadenopathy or thyromegaly.  CARDIAC:  PMI is not palpable.  He is regular and bradycardic.  No  obvious murmurs.  LUNGS:  Clear.  ABDOMEN:  Obese, nontender, and nondistended.  No bruits.  No obvious  masses.  EXTREMITIES:  Warm with no cyanosis, clubbing, or edema.  No rash.  Good  pulses.  NEURO:  Alert and oriented x3.  Cranial nerves II through XII are  intact.  He moves all fours without difficulty.  Affect is pleasant.   EKG shows sinus bradycardia rate of 50.  No ST-T wave abnormalities.  QTC interval was 393 ms.   ASSESSMENT AND PLAN:  1. Atrial fibrillation.  He is doing well.  He is maintaining sinus      rhythm on Tikosyn.  QT interval was okay.  He is a little bit      bradycardic, but is not symptomatic.  Continue current therapy.  2. Hypertension, well-controlled.  3. Hyperlipidemia.  He is due for his lipids today.  Given      interactions with Zocor and diltiazem, I think it is probably best      to switch him over to lovastatin.  We will make this change.  4. Obesity.  I have encouraged him  to continue to exercise and      consider [facility] Diet.   DISPOSITION:  We will see him back in the clinic in 6 months for  followup.  We will check his lipids and liver panel today.     Mark Gonzalez. Bensimhon, MD  Electronically Signed    DRB/MedQ  DD: 05/23/2008  DT: 05/23/2008  Job #: [id number]   cc:   Areatha Keas, M.D.

## 2010-12-14 NOTE — Assessment & Plan Note (Signed)
[facility]                            CARDIOLOGY OFFICE NOTE   Mark Gonzalez, MATUSEK                         MRN:          [id number]  DATE:01/24/2008                            DOB:          1960/10/19    RHEUMATOLOGIST:  Armandina Gemma, MD.   INTERVAL HISTORY:  Mark Gonzalez is a very pleasant 50 year old male with a  history of psoriatic arthritis, obesity, and hypertension.  We have been  seeing him for atrial fibrillation.  He was recently started on Tikosyn  and underwent a repeat cardioversion.  He comes today for a followup.  Ejection fraction in the past has been in the 45-50% range and I felt  that this was due to atrial fibrillation on his Myoview.  There was no  ischemia or infarct.  EF was not done as it was not a gated study.   He feels fairly good.  He has not had any palpitations or chest pain.  He does note that he is somewhat fatigued at times, but he can tolerate  it.  He is back in normal rhythm.  Recently, he has been having trouble  with cold.  He has not had any problems with Coumadin.   CURRENT MEDICATIONS:  1. Coumadin.  2. Diltiazem 180.  3. Lisinopril 20 a day.  4. Tikosyn 500 b.i.d.  5. Aspirin 81 a day.  6. Enbrel.  7. Arthrotec 75 b.i.d.  8. Fish oil 2000 a day.  9. Multivitamin.   PHYSICAL EXAMINATION:  GENERAL:  He is in no acute distress, ambulates  around the clinic without any respiratory difficulty.  VITAL SIGNS:  Blood pressure is 122/82, heart rate is 60, and weight is  354, which is down 13 pounds.  HEENT:  Normal.  NECK:  Supple.  No JVD.  Carotids are 2+ bilateral without bruits.  There is no lymphadenopathy or thyromegaly.  CARDIAC:  PMI is nondisplaced.  He is bradycardic and regular.  No  murmurs, rubs, or gallops.  LUNGS:  Clear.  ABDOMEN:  Obese, nontender, and nondistended.  No masses.  Good bowel  sounds.  No bruits.  EXTREMITIES:  Warm with no cyanosis, clubbing, or edema.  He does have  psoriatic patches.  NEURO:  He is alert and x3.  Cranial nerves II-XII are intact.  Moves  all 4 extremities without difficulty.  Affect is pleasant.   DIAGNOSTIC IMAGING:  EKG showed sinus bradycardia at a rate of 50.  QTC  interval is 412 milliseconds.   ASSESSMENT AND PLAN:  1. Paroxysmal atrial fibrillation.  He is maintaining sinus rhythm on      Tikosyn.  His [place] score is 1.  He has borderline cough on      Coumadin, he seems to be tolerating it well.  We will continue him      on it for now.  If he has problems with it, could consider aspirin      and Plavix in the future.  2. Hypertension, much improved control.  However, we are stopping his      diltiazem due to bradycardia,  so we may see a blood pressure drift      upward.  If that is the case, we can try putting on some Norvasc.  3. Lipids.  We will check his cholesterol panel today and treat him      accordingly.   DISPOSITION:  We will see him back in 4 months for a routine followup.     Bevelyn Buckles. Bensimhon, MD  Electronically Signed    DRB/MedQ  DD: 01/24/2008  DT: 01/24/2008  Job #: [id number]   cc:   Areatha Keas, M.D.

## 2010-12-17 NOTE — Discharge Summary (Signed)
[name], [name]                            ACCOUNT NO.:  [id number]   MEDICAL RECORD NO.:  [id number]                   PATIENT TYPE:  INP   LOCATION:                                       FACILITY:  [facility]   PHYSICIAN:  Zola Button T. Lazarus Salines, M.D.              DATE OF BIRTH:  1961-02-21   DATE OF ADMISSION:  02/20/2003  DATE OF DISCHARGE:  02/23/2003                                 DISCHARGE SUMMARY   CHIEF COMPLAINT:  Right eye-swelling.   HISTORY OF PRESENT ILLNESS:  A 50 -year-old white male who noted for the  past 36 hours, prior to admission, progressive swelling around the right  eye, especially in the medial lower lid.  He has no history of sinus  infections and no current sinus symptoms, including facial pressure, nasal  congestion or drainage.  He denies any premorbid lesions.  No facial trauma,  insect, or animal bites or scratches, sunburn or similar.  He has psoriasis  and takes Arthrotec and Enbrel but has never had any significant immune  compromise related to this.  He has been noted to have borderline diabetes  in the past.  He has deliberately lost 125 pounds in the last year.  He  denies fevers.  No stiff neck or photophobia.   ALLERGIES:  He has a questionable allergy to PENICILLIN but has taken  amoxicillin.   MEDICATIONS:  Again, medications are Arthrotec and Enbrel.   PAST MEDICAL HISTORY:  He denies diabetes, hypertension, HIV or hepatitis.  No recent prednisone therapy.  No heart attack or stroke.  Negative for  asthma or epilepsy.  He does have psoriasis and psoriatic arthritis.   SOCIAL HISTORY:  He is married with children.  He is a Engineer, manufacturing.  He does not smoke.   FAMILY HISTORY:  Noncontributory.   REVIEW OF SYSTEMS:  Negative except as above.   PHYSICAL EXAMINATION:  GENERAL:  This is a moderately heavy alert adult  white male.  HEENT:  He has markedly erythematous tender and swollen right lower lid,  especially medially, with some extension onto the right upper lid and a  small amount of extension into the left medial periorbital area.  NEUROLOGIC:  Cranial nerves are intact, including full range of motion of  both eyes.  He has slight chemosis and injection of the right eye.  The  pupils are reactive, and vision is intact in each eye subjectively.  Ears  are clear.  Oral cavity and pharynx are clear.  Nose clear.  NECK:  Clear.  LUNGS:  Clear with unlabored respirations.  HEART:  Reveals regular rate and rhythm, a slow rate consistent with his  athletic-conditioning.  ABDOMEN:  Is slightly obese, soft and active.  EXTREMITIES:  Normal configuration except for psoriatic changes.   ADMISSION DIAGNOSIS:  Right periorbital, question orbital, cellulitis.   PLAN:  1.  Intravenous antibiotics.  2. Infectious Disease consultation.  3. Cultures were taken in the office prior to admission from both the     conjunctival sac and the right-to-middle meatus.  4. A CT scan, in the office, showed very slight thickening of the anterior     sinuses consistent with reactive edema, not with a primary infectious     process.   ADMISSION LABORATORY STUDIES:  Revealed a white blood cell count of 8,000,  hematocrit 41.5, hemoglobin 14.4; potassium 3.4, glucose 118 and the  remainder within normal limits.   HOSPITAL COURSE:  The patient was admitted to the hospital and placed on  intravenous Unasyn.  Infectious Disease consultation agreed with choice of  agents.   The following day, Tmax was 100.9, and the patient had, still, very  significant pain and persistent swelling.   By the second postoperative day, the pain had settled substantially, and he  no longer was febrile.  He had some increased ability to open his eye and  decreased swelling.   By the third hospital day, he was feeling very much better and was  discharged to his home on oral Augmentin.  He declined pain medications.  He  was  given instructions regarding worsening of infection and need to re-  contact us.   ACTIVITY:  He will resume normal activity.   DIET:  Normal diet.   DIAGNOSIS:  Right preseptal periorbital cellulitis.   SECONDARY DIAGNOSIS:  Psoriasis.   PROCEDURES:  None.   COMPLICATIONS:  None.   CONDITION AT DISCHARGE:  Ambulatory, pain controlled, taking a full regular  diet, and with clear clinical resolution of his infection.   FOLLOW UP:  Return visit in one week - ENT.    INSTRUCTIONS:  Written and given.   DISCHARGE MEDICATION:  Prescription Augmentin 875 mg b.i.d.                                                Zola Button T. Lazarus Salines, M.D.    KTW/MEDQ  D:  03/12/2003  T:  03/13/2003  Job:  [id number]   cc:   Cliffton Asters, M.D.  [address] [place]  [place] [address]  Fax: [phone]   Vincenza Hews, M.D.  [address]  [place]  [place] [address]  Fax: [phone]   chart

## 2010-12-20 ENCOUNTER — Other Ambulatory Visit: Payer: Self-pay | Admitting: Internal Medicine

## 2010-12-20 ENCOUNTER — Encounter: Payer: Self-pay | Admitting: Internal Medicine

## 2010-12-23 ENCOUNTER — Encounter: Payer: Self-pay | Admitting: Internal Medicine

## 2010-12-24 ENCOUNTER — Ambulatory Visit (INDEPENDENT_AMBULATORY_CARE_PROVIDER_SITE_OTHER): Payer: BC Managed Care – PPO | Admitting: Internal Medicine

## 2010-12-24 ENCOUNTER — Ambulatory Visit: Payer: BC Managed Care – PPO | Admitting: *Deleted

## 2010-12-24 ENCOUNTER — Encounter: Payer: Self-pay | Admitting: Internal Medicine

## 2010-12-24 VITALS — BP 138/90 | HR 61 | Resp 18 | Ht 72.0 in | Wt 379.0 lb

## 2010-12-24 DIAGNOSIS — I4891 Unspecified atrial fibrillation: Secondary | ICD-10-CM

## 2010-12-24 DIAGNOSIS — I10 Essential (primary) hypertension: Secondary | ICD-10-CM

## 2010-12-24 NOTE — Progress Notes (Signed)
HPI:  Mark Gonzalez is a very pleasant 50 y/o male with a history of psoriatic arthritis, obesity, hypertension, and atrial fibrillation.  He is maintained in sinus rhythm on Tikosyn.  He previously has a negative Myoview with an EF of 45-50%. His ejection fraction was thought to be mildly depressed due to presence of atrial fibrillation. Echo 9/11 EF 55-60%.  Seen in April for recurrent AF. Was scheduled for DC-CV but converted spontaneously after about 48 hours. No breakthroughs since. Feels good. Somewhat active with work but limited by back pain. Has f/u with Frazier Butt.  BP well controlled at home.   ROS: All systems negative except as listed in HPI, PMH and Problem List.  Past Medical History  Diagnosis Date  . Paroxysmal atrial fibrillation     s/p DCCV 09/2007  and 11/2007 with reversion to AF, now on Tikosyn and chronic coumadin, EF 60%, myoview 2009 EF 45-50% in setting of AF, no ischemia or scar  . Morbid obesity   . Hypertension   . Psoriatic arthritis     Current Outpatient Prescriptions  Medication Sig Dispense Refill  . aspirin 81 MG tablet Take 81 mg by mouth daily.        . diclofenac-misoprostol (ARTHROTEC 75) 75-0.2 MG per tablet Take 1 tablet by mouth 2 (two) times daily.        . Etanercept (ENBREL) 25 MG/0.5ML SOLN Inject 25 mg into the skin 2 (two) times a week.        . furosemide (LASIX) 20 MG tablet Take 20 mg by mouth. Take 1 tab every Mon, Wed, and Fri       . lovastatin (MEVACOR) 20 MG tablet Take 20 mg by mouth at bedtime.        . Multiple Vitamin (MULTIVITAMIN) tablet Take 1 tablet by mouth daily.        . niacin (NIASPAN) 500 MG CR tablet Take 500 mg by mouth at bedtime.        . Omega-3 Fatty Acids (FISH OIL) 1200 MG CAPS Take 1 capsule by mouth 2 (two) times daily.        . potassium chloride SA (K-DUR,KLOR-CON) 20 MEQ tablet Take 20 mEq by mouth. Take 1 tab every Mon, Wed and Fri       . TIKOSYN 500 MCG capsule take 1 capsule by mouth twice a day  180 capsule   1  . valsartan (DIOVAN) 80 MG tablet Take 80 mg by mouth daily.        Marland Kitchen warfarin (COUMADIN) 5 MG tablet Take by mouth as directed.           PHYSICAL EXAM: Filed Vitals:   12/24/10 0938  BP: 138/90  Pulse: 61  Resp: 18   General:   He is in no acute distress, ambulates around the clinic without any respiratory difficulty. HEENT:  Normal. NECK:  Supple and thick.  No obvious JVD.  Carotids are 2+ bilaterally without any bruits.  There is no lymphadenopathy or thyromegaly. CARDIAC:  PMI is not palpable.  He is regular.  No obvious murmurs. LUNGS:  Clear. ABDOMEN:  Obese, nontender, and nondistended.   EXTREMITIES:  Warm with no cyanosis, clubbing, or edema.  +psoriatic plaques  Good pulses. NEURO:  Alert and oriented x3.  Cranial nerves II through XII are intact.  He moves all fours without difficulty.  Affect is pleasant.   ECG:  Sinus brady 55 No ST-T wave abnormalities.      ASSESSMENT & PLAN:

## 2010-12-24 NOTE — Assessment & Plan Note (Signed)
Doing well. Maintaining SR on Tikosyn. Tolerating coumadin well. Continue current regimen.

## 2010-12-24 NOTE — Patient Instructions (Signed)
Continue current medications Schedule a BP check for 1 week - please being your home BP monitor to check Follow in 6 months with Dr Gala Romney

## 2010-12-24 NOTE — Assessment & Plan Note (Addendum)
Blood pressure well controlled at home. Continue current regimen for now. Bring back for nurse visit next week to calibrate BP cuff.

## 2010-12-29 ENCOUNTER — Other Ambulatory Visit: Payer: Self-pay | Admitting: Sports Medicine

## 2010-12-29 DIAGNOSIS — M545 Low back pain: Secondary | ICD-10-CM

## 2010-12-29 DIAGNOSIS — M5416 Radiculopathy, lumbar region: Secondary | ICD-10-CM

## 2010-12-31 ENCOUNTER — Ambulatory Visit
Admission: RE | Admit: 2010-12-31 | Discharge: 2010-12-31 | Disposition: A | Discharge status: Home / Self Care | Payer: BC Managed Care – PPO | Source: Ambulatory Visit | Attending: Sports Medicine | Admitting: Sports Medicine

## 2010-12-31 DIAGNOSIS — M5416 Radiculopathy, lumbar region: Secondary | ICD-10-CM

## 2010-12-31 DIAGNOSIS — M545 Low back pain: Secondary | ICD-10-CM

## 2011-01-04 ENCOUNTER — Other Ambulatory Visit: Payer: Self-pay

## 2011-01-04 MED ORDER — WARFARIN SODIUM 5 MG PO TABS: ORAL_TABLET | ORAL | Status: DC

## 2011-01-06 ENCOUNTER — Telehealth: Payer: Self-pay | Admitting: Internal Medicine

## 2011-01-06 NOTE — Telephone Encounter (Signed)
Pt needs to have a release for stop coumadin and [facility] was to send a form and they want to know have got the form yet

## 2011-01-06 NOTE — Telephone Encounter (Signed)
Left a message to call back.

## 2011-01-07 ENCOUNTER — Telehealth: Payer: Self-pay | Admitting: *Deleted

## 2011-01-07 NOTE — Telephone Encounter (Signed)
Pt calling back re form to [facility] needs it signed and sent back for his steroid injection-pls call [phone]

## 2011-01-07 NOTE — Telephone Encounter (Signed)
Mr sivley calling about form he sent over to have epidural steroid inj. --pt is on coumadin and will need to stop--advised heather and dr Jesusita Oka not in office today--please call back mon. to see if heather has form, and if dr bensimhon has signed and how long (IF HE CAN) stop coumadin--nt

## 2011-01-10 ENCOUNTER — Telehealth: Payer: Self-pay | Admitting: Internal Medicine

## 2011-01-10 ENCOUNTER — Other Ambulatory Visit: Payer: Self-pay | Admitting: Sports Medicine

## 2011-01-10 DIAGNOSIS — M5126 Other intervertebral disc displacement, lumbar region: Secondary | ICD-10-CM

## 2011-01-10 NOTE — Telephone Encounter (Signed)
Per pt call he needs a signature on a form for [facility] to get a steroid epidural injection and he is following up on that cause he needs it asap he is in a lot of pain

## 2011-01-10 NOTE — Telephone Encounter (Signed)
Per Dr Gala Romney ok to hold coumadin 4 days for epidural injection, form faxed to [facility], pt is aware, he will resume coumadin the night procedure is done and will make f/u appt w/coumadin clinic for 5-7 days after restarting med

## 2011-01-10 NOTE — Telephone Encounter (Signed)
Done see phone note dated 6/11

## 2011-01-11 ENCOUNTER — Ambulatory Visit (INDEPENDENT_AMBULATORY_CARE_PROVIDER_SITE_OTHER): Payer: BC Managed Care – PPO | Admitting: *Deleted

## 2011-01-11 ENCOUNTER — Ambulatory Visit
Admission: RE | Admit: 2011-01-11 | Discharge: 2011-01-11 | Disposition: A | Discharge status: Home / Self Care | Payer: BC Managed Care – PPO | Source: Ambulatory Visit | Attending: Sports Medicine | Admitting: Sports Medicine

## 2011-01-11 DIAGNOSIS — I4891 Unspecified atrial fibrillation: Secondary | ICD-10-CM

## 2011-01-11 DIAGNOSIS — M5126 Other intervertebral disc displacement, lumbar region: Secondary | ICD-10-CM

## 2011-01-14 ENCOUNTER — Other Ambulatory Visit: Payer: Self-pay | Admitting: Internal Medicine

## 2011-01-20 ENCOUNTER — Ambulatory Visit (INDEPENDENT_AMBULATORY_CARE_PROVIDER_SITE_OTHER): Payer: BC Managed Care – PPO | Admitting: *Deleted

## 2011-01-20 DIAGNOSIS — I4891 Unspecified atrial fibrillation: Secondary | ICD-10-CM

## 2011-01-21 ENCOUNTER — Encounter: Payer: BC Managed Care – PPO | Admitting: *Deleted

## 2011-02-03 ENCOUNTER — Other Ambulatory Visit: Payer: Self-pay | Admitting: Internal Medicine

## 2011-02-07 ENCOUNTER — Telehealth: Payer: Self-pay | Admitting: Internal Medicine

## 2011-02-07 NOTE — Telephone Encounter (Signed)
Pt went back into a-fib on Sat night his HR has slowed down to the 80s but still irreg. Doesn't know if we should do anything or wait to see if he converts back on his own, will discuss w/Dr Bensimhon and call him back

## 2011-02-07 NOTE — Telephone Encounter (Signed)
Pt calling stating he thinks he is out of sinus rhythm. Pt would like nurse to call back to advise.

## 2011-02-07 NOTE — Telephone Encounter (Signed)
Per Dr Gala Romney if still in a-fib tom will need to set up for DCCV, pt is aware he states he is still in a-fib now and HR is ranging from 60-100s he will continue to take it easy today and will call me in am w/update if still in a-fib will try to sch dccv for tom or wed.

## 2011-02-08 ENCOUNTER — Encounter: Payer: Self-pay | Admitting: *Deleted

## 2011-02-08 ENCOUNTER — Telehealth: Payer: Self-pay | Admitting: Internal Medicine

## 2011-02-08 NOTE — Telephone Encounter (Signed)
Pt still in a-fib this am, will see if dccv can be arrange for this afternoon and call him back

## 2011-02-08 NOTE — Telephone Encounter (Signed)
dccv sch for tom at 2pm with Dr Gala Romney pt is aware and instructions reviewed w/him via phone

## 2011-02-08 NOTE — Telephone Encounter (Signed)
Per pt calling. C/O still out of rhythm. Pt has question regarding cardioversion today.

## 2011-02-09 ENCOUNTER — Telehealth: Payer: Self-pay | Admitting: Internal Medicine

## 2011-02-09 ENCOUNTER — Ambulatory Visit (HOSPITAL_COMMUNITY)
Admission: RE | Admit: 2011-02-09 | Discharge: 2011-02-09 | Disposition: A | Discharge status: Home / Self Care | Payer: BC Managed Care – PPO | Source: Ambulatory Visit | Attending: Internal Medicine | Admitting: Internal Medicine

## 2011-02-09 DIAGNOSIS — Z01812 Encounter for preprocedural laboratory examination: Secondary | ICD-10-CM | POA: Insufficient documentation

## 2011-02-09 DIAGNOSIS — I4891 Unspecified atrial fibrillation: Secondary | ICD-10-CM

## 2011-02-09 DIAGNOSIS — Z01810 Encounter for preprocedural cardiovascular examination: Secondary | ICD-10-CM | POA: Insufficient documentation

## 2011-02-09 LAB — BASIC METABOLIC PANEL
BUN: 22 mg/dL (ref 6–23)
Chloride: 106 mEq/L (ref 96–112)
Creatinine, Ser: 0.64 mg/dL (ref 0.50–1.35)
GFR calc Af Amer: >60 mL/min (ref >60)
Glucose, Bld: 108 mg/dL — ABNORMAL HIGH (ref 70–99)
Potassium: 4.8 mEq/L (ref 3.5–5.1)

## 2011-02-09 LAB — CBC
HCT: 45.2 % (ref 39.0–52.0)
Hemoglobin: 16.3 g/dL (ref 13.0–17.0)
MCV: 86.6 fL (ref 78.0–100.0)
RDW: 13.2 % (ref 11.5–15.5)
WBC: 5.7 10*3/uL (ref 4.0–10.5)

## 2011-02-09 NOTE — Telephone Encounter (Signed)
Pt saw Dr. Gala Romney today, pt was told to call Heather back to have her consult with MD to see what should be done, if a return office visit is urgent.

## 2011-02-09 NOTE — Telephone Encounter (Signed)
Pt had dccv today and states afterwards he had a spike in his HR and DB mentioned may be changing him over to flecainide, wanted to know when he needed a f/u appt.  Will check w/DB on how soon he needs to be seen

## 2011-02-18 NOTE — Telephone Encounter (Signed)
Is he maintaining sinus? If so, let's try to see him back in 4-6 weeks. If bouncing in/out of sinus then sooner.

## 2011-03-03 ENCOUNTER — Other Ambulatory Visit: Payer: Self-pay | Admitting: Internal Medicine

## 2011-03-08 ENCOUNTER — Other Ambulatory Visit: Payer: Self-pay | Admitting: Internal Medicine

## 2011-03-10 ENCOUNTER — Other Ambulatory Visit: Payer: Self-pay | Admitting: Physical Medicine and Rehabilitation

## 2011-03-10 DIAGNOSIS — M545 Low back pain: Secondary | ICD-10-CM

## 2011-03-17 NOTE — Telephone Encounter (Signed)
Pt has been doing well no a-fib, he will call me back to sch an appt in HF clinic

## 2011-03-18 ENCOUNTER — Other Ambulatory Visit: Payer: Self-pay | Admitting: Physical Medicine and Rehabilitation

## 2011-03-18 ENCOUNTER — Ambulatory Visit (INDEPENDENT_AMBULATORY_CARE_PROVIDER_SITE_OTHER): Payer: BC Managed Care – PPO | Admitting: *Deleted

## 2011-03-18 ENCOUNTER — Ambulatory Visit
Admission: RE | Admit: 2011-03-18 | Discharge: 2011-03-18 | Disposition: A | Discharge status: Home / Self Care | Payer: BC Managed Care – PPO | Source: Ambulatory Visit | Attending: Physical Medicine and Rehabilitation | Admitting: Physical Medicine and Rehabilitation

## 2011-03-18 DIAGNOSIS — I4891 Unspecified atrial fibrillation: Secondary | ICD-10-CM

## 2011-03-18 DIAGNOSIS — M545 Low back pain, unspecified: Secondary | ICD-10-CM

## 2011-03-18 MED ORDER — METHYLPREDNISOLONE ACETATE 40 MG/ML INJ SUSP (RADIOLOG
120.0000 mg | Freq: Once | INTRAMUSCULAR | Status: AC
  Administered 2011-03-18: 120 mg via EPIDURAL

## 2011-03-18 MED ORDER — IOHEXOL 180 MG/ML  SOLN
1.0000 mL | Freq: Once | INTRAMUSCULAR | Status: AC | PRN
  Administered 2011-03-18: 1 mL via EPIDURAL

## 2011-03-25 ENCOUNTER — Ambulatory Visit (INDEPENDENT_AMBULATORY_CARE_PROVIDER_SITE_OTHER): Payer: BC Managed Care – PPO | Admitting: *Deleted

## 2011-03-25 DIAGNOSIS — I4891 Unspecified atrial fibrillation: Secondary | ICD-10-CM

## 2011-03-29 NOTE — Cardiovascular Report (Signed)
  NAMEDJON, TITH                ACCOUNT NO.:  [id number]  MEDICAL RECORD NO.:  [id number]  LOCATION:  [facility]                         FACILITY:  [facility]  PHYSICIAN:  Bevelyn Buckles. Bensimhon, MDDATE OF BIRTH:  1961/06/09  DATE OF PROCEDURE:  02/09/2011 DATE OF DISCHARGE:  02/09/2011                           CARDIAC CATHETERIZATION   PATIENT IDENTIFICATION:  Mark Gonzalez is a very pleasant 50 year old male with a history of morbid obesity and paroxysmal atrial fibrillation.  He has been maintained on Tikosyn.  He had an episode of breakthrough atrial fibrillation in April, which was self-limited, and he converted spontaneously.  Over the past few days, he had recurrent atrial fibrillation, which has been symptomatic.  He was thus brought for direct current cardioversion.  His INRs have been therapeutic for nearly 4 weeks now.  DESCRIPTION:  The risks and indication were explained.  Consent was signed and placed on the chart.  The appropriate time out was taken.  He then received 140 mg of IV Diprivan by Dr. Gypsy Balsam of the Anesthesia Service.  After appropriate sedation, he received a single 200 joules biphasic synchronized shock with prompt conversion to sinus rhythm. There are no apparent complications.  PLAN/DISCUSSION:  We will continue him on Tikosyn for now.  Should he have recurrent breakthrough atrial fibrillation, we will consider switching him to flecainide.     Bevelyn Buckles. Bensimhon, MD     DRB/MEDQ  D:  02/09/2011  T:  02/10/2011  Job:  [id number]  Electronically Signed by Arvilla Meres MD on 03/29/2011 05:14:08 PM

## 2011-03-31 ENCOUNTER — Ambulatory Visit (HOSPITAL_COMMUNITY)
Admission: RE | Admit: 2011-03-31 | Discharge: 2011-03-31 | Disposition: A | Discharge status: Home / Self Care | Payer: BC Managed Care – PPO | Source: Ambulatory Visit | Attending: Internal Medicine | Admitting: Internal Medicine

## 2011-03-31 VITALS — BP 138/84 | HR 61 | Wt 376.0 lb

## 2011-03-31 DIAGNOSIS — I10 Essential (primary) hypertension: Secondary | ICD-10-CM

## 2011-03-31 DIAGNOSIS — I4891 Unspecified atrial fibrillation: Secondary | ICD-10-CM | POA: Insufficient documentation

## 2011-03-31 NOTE — Progress Notes (Signed)
HPI:  Mark Gonzalez is a very pleasant 50 y/o male with a history of psoriatic arthritis, obesity, hypertension, and atrial fibrillation.  He is maintained in sinus rhythm on Tikosyn.  He previously has a negative Myoview with an EF of 45-50%. His ejection fraction was thought to be mildly depressed due to presence of atrial fibrillation. Echo 9/11 EF 55-60%.  Seen in April for recurrent AF. Was scheduled for DC-CV but converted spontaneously after about 48 hours. Had another breakthrough in July and underwent DC-CV. Since that time no further breakthroughs. Feels good from cardiac standpoint but very limited by back pain. Trying to schedule with Dr. Grant Ruts.  BP mildly elevated at home attributes to back pain.   ROS: All systems negative except as listed in HPI, PMH and Problem List.  Past Medical History  Diagnosis Date  . Paroxysmal atrial fibrillation     s/p DCCV 09/2007  and 11/2007 with reversion to AF, now on Tikosyn and chronic coumadin, EF 60%, myoview 2009 EF 45-50% in setting of AF, no ischemia or scar  . Morbid obesity   . Hypertension   . Psoriatic arthritis     Current Outpatient Prescriptions  Medication Sig Dispense Refill  . adalimumab (HUMIRA PEN) 40 MG/0.8ML injection Inject 40 mg into the skin once. Injects every other week       . aspirin 81 MG tablet Take 81 mg by mouth daily.        . diclofenac-misoprostol (ARTHROTEC 75) 75-0.2 MG per tablet Take 1 tablet by mouth 2 (two) times daily.        Marland Kitchen DIOVAN 80 MG tablet TAKE 1 TABLET BY MOUTH ONCE DAILY  30 tablet  5  . furosemide (LASIX) 20 MG tablet Take 20 mg by mouth. Take 1 tab every Mon, Wed, and Fri       . KLOR-CON M20 20 MEQ tablet take 1 tablet by mouth EVERY MON, WED AND FRI  12 tablet  8  . lovastatin (MEVACOR) 20 MG tablet Take 20 mg by mouth at bedtime.        . Multiple Vitamin (MULTIVITAMIN) tablet Take 1 tablet by mouth daily.        Marland Kitchen NIASPAN 500 MG CR tablet TAKE 1 TABLET BY MOUTH AT BEDTIME AS DIRECTED AND TAKE  1 ASPIRIN 1/2 HOUR PRIOR TO NIASPAN  30 tablet  9  . Omega-3 Fatty Acids (FISH OIL) 1200 MG CAPS Take 1 capsule by mouth 2 (two) times daily.        Marland Kitchen TIKOSYN 500 MCG capsule TAKE 1 CAPSULE BY MOUTH TWICE A DAY  180 capsule  3  . warfarin (COUMADIN) 5 MG tablet Take as directed by Anticoagulation clinic  60 tablet  3     PHYSICAL EXAM: Filed Vitals:   03/31/11 1107  BP: 138/84   General:   He is in no acute distress, ambulates around the clinic without any respiratory difficulty. HEENT:  Normal. NECK:  Supple and thick.  No obvious JVD.  Carotids are 2+ bilaterally without any bruits.  There is no lymphadenopathy or thyromegaly. CARDIAC:  PMI is not palpable.  He is regular.  No obvious murmurs. LUNGS:  Clear. ABDOMEN:  Obese, nontender, and nondistended.   EXTREMITIES:  Warm with no cyanosis, clubbing, or edema.  +psoriatic plaques  Good pulses. NEURO:  Alert and oriented x3.  Cranial nerves II through XII are intact.  He moves all fours without difficulty.  Affect is pleasant.   ECG:  Sinus brady  55 No ST-T wave abnormalities.      ASSESSMENT & PLAN:

## 2011-04-04 NOTE — Assessment & Plan Note (Signed)
Maintaining SR. Will continue Tikosyn and anti-coagulation. If continues to have breakthroughs may need to consider switching anti-arrhythmic therapy. Given his size, I feel AF ablation would be higher risk and would reserve this only if he failed all our pharmacologic options.

## 2011-04-09 ENCOUNTER — Other Ambulatory Visit: Payer: Self-pay | Admitting: Internal Medicine

## 2011-04-11 ENCOUNTER — Telehealth (HOSPITAL_COMMUNITY): Payer: Self-pay | Admitting: *Deleted

## 2011-04-11 NOTE — Telephone Encounter (Signed)
Mark Gonzalez called this am to let you know that he is back in rhythm.

## 2011-04-11 NOTE — Telephone Encounter (Signed)
Pt ok states he went back to normal rhythm yesterday and has been feeling better

## 2011-04-12 NOTE — Telephone Encounter (Signed)
Per Dr Gala Romney pt should wait 2 weeks before coming off coumadin for an epidural injection, pt is aware and agreeable

## 2011-04-20 ENCOUNTER — Telehealth (HOSPITAL_COMMUNITY): Payer: Self-pay | Admitting: *Deleted

## 2011-04-20 MED ORDER — FLECAINIDE ACETATE 150 MG PO TABS: 150.0000 mg | ORAL_TABLET | Freq: Two times a day (BID) | ORAL | Status: DC

## 2011-04-20 MED ORDER — DILTIAZEM HCL ER COATED BEADS 180 MG PO CP24: 180.0000 mg | ORAL_CAPSULE | Freq: Every day | ORAL | Status: DC

## 2011-04-20 NOTE — Telephone Encounter (Signed)
Pt called stated he had went back into a-fib, have discussed with Dr Gala Romney will stop Tikosyn start Dilt. 180 mg daily and on Fri 9/21 he will start flecanide 150 mg bid due to back problems will not do gxt, but pt will f/u in 2 weeks 10/8 at 12:15 pt is aware and agreeable with plan

## 2011-04-22 ENCOUNTER — Ambulatory Visit (INDEPENDENT_AMBULATORY_CARE_PROVIDER_SITE_OTHER): Payer: BC Managed Care – PPO | Admitting: *Deleted

## 2011-04-22 DIAGNOSIS — I4891 Unspecified atrial fibrillation: Secondary | ICD-10-CM

## 2011-04-22 LAB — POCT INR: INR: 3

## 2011-04-26 ENCOUNTER — Other Ambulatory Visit: Payer: Self-pay | Admitting: Internal Medicine

## 2011-04-27 LAB — BASIC METABOLIC PANEL
BUN: 13
CO2: 26
Calcium: 8.8
Chloride: 105
Chloride: 105
Creatinine, Ser: 0.75
GFR calc Af Amer: >60
GFR calc Af Amer: >60
GFR calc Af Amer: >60
GFR calc non Af Amer: >60
GFR calc non Af Amer: >60
GFR calc non Af Amer: >60
Potassium: 4.1
Potassium: 4.2
Sodium: 136
Sodium: 139
Sodium: 139

## 2011-04-27 LAB — PROTIME-INR
INR: 2.2 — ABNORMAL HIGH
INR: 2.2 — ABNORMAL HIGH
Prothrombin Time: 23.5 — ABNORMAL HIGH
Prothrombin Time: 24.7 — ABNORMAL HIGH

## 2011-04-27 LAB — MAGNESIUM
Magnesium: 2.2
Magnesium: 2.3

## 2011-05-02 ENCOUNTER — Telehealth (HOSPITAL_COMMUNITY): Payer: Self-pay | Admitting: *Deleted

## 2011-05-02 NOTE — Telephone Encounter (Signed)
Called pt he is still in a-fib just wanted 2 let us know, he is on new meds and HR is ok

## 2011-05-02 NOTE — Telephone Encounter (Signed)
Mr Hyde called this afternoon, he wants you to be aware that they changed some of his meds last week, and he still is not in rhythm.  He is scheduled to come in next week. He would like a call back.

## 2011-05-09 ENCOUNTER — Ambulatory Visit (HOSPITAL_COMMUNITY)
Admission: RE | Admit: 2011-05-09 | Discharge: 2011-05-09 | Disposition: A | Discharge status: Home / Self Care | Payer: BC Managed Care – PPO | Source: Ambulatory Visit | Attending: Internal Medicine | Admitting: Internal Medicine

## 2011-05-09 ENCOUNTER — Other Ambulatory Visit: Payer: Self-pay

## 2011-05-09 ENCOUNTER — Encounter (HOSPITAL_COMMUNITY): Payer: Self-pay | Admitting: *Deleted

## 2011-05-09 ENCOUNTER — Encounter: Payer: Self-pay | Admitting: Internal Medicine

## 2011-05-09 VITALS — BP 118/80 | HR 55 | Wt 387.0 lb

## 2011-05-09 DIAGNOSIS — L4050 Arthropathic psoriasis, unspecified: Secondary | ICD-10-CM | POA: Insufficient documentation

## 2011-05-09 DIAGNOSIS — Z7901 Long term (current) use of anticoagulants: Secondary | ICD-10-CM | POA: Insufficient documentation

## 2011-05-09 DIAGNOSIS — E669 Obesity, unspecified: Secondary | ICD-10-CM | POA: Insufficient documentation

## 2011-05-09 DIAGNOSIS — R609 Edema, unspecified: Secondary | ICD-10-CM

## 2011-05-09 DIAGNOSIS — I4891 Unspecified atrial fibrillation: Secondary | ICD-10-CM

## 2011-05-09 DIAGNOSIS — I10 Essential (primary) hypertension: Secondary | ICD-10-CM | POA: Insufficient documentation

## 2011-05-09 NOTE — Telephone Encounter (Signed)
Pt seen in clinic today dccv sch for tomorrow 10/9

## 2011-05-09 NOTE — Patient Instructions (Signed)
Labs today  Your physician has recommended that you have a Cardioversion (DCCV). Electrical Cardioversion uses a jolt of electricity to your heart either through paddles or wired patches attached to your chest. This is a controlled, usually prescheduled, procedure. Defibrillation is done under light anesthesia in the hospital, and you usually go home the day of the procedure. This is done to get your heart back into a normal rhythm. You are not awake for the procedure. Please see the instruction sheet given to you today.  Your physician recommends that you schedule a follow-up appointment in: 1 month

## 2011-05-09 NOTE — Assessment & Plan Note (Signed)
He has failed Tikosyn. Now on flecainide but still in AF. He is quite symptomatic. Will plan repeat DC-CV in am and see if he can hold NSR on flecainide. We had long talk about possibility of AF ablation. INRs have been therapeutic.

## 2011-05-09 NOTE — Progress Notes (Signed)
HPI:  Mark Gonzalez is a very pleasant 50 y/o male with a history of psoriatic arthritis, obesity, hypertension, and atrial fibrillation.  He is maintained in sinus rhythm on Tikosyn.  He previously has a negative Myoview with an EF of 45-50%. His ejection fraction was thought to be mildly depressed due to presence of atrial fibrillation. Echo 9/11 EF 55-60%.  Seen in April for recurrent AF. Was scheduled for DC-CV but converted spontaneously after about 48 hours. Had recurrent AF in July and underwent DC-CV. But reverted back to AF in September but then converted spontaneously. However a week later went back into AF. Tikosyn stopped and flecainide started at 150 bid on September 14. 2012.  Remains in AF since.  Feels fatigued. Low energy. No frank HF.  + LE edema. No orthopnea or PND. No syncope or presyncope. INRs all therapeutic.    ROS: All systems negative except as listed in HPI, PMH and Problem List.  Past Medical History  Diagnosis Date  . Paroxysmal atrial fibrillation     s/p DCCV 09/2007  and 11/2007 with reversion to AF, now on Tikosyn and chronic coumadin, EF 60%, myoview 2009 EF 45-50% in setting of AF, no ischemia or scar  . Morbid obesity   . Hypertension   . Psoriatic arthritis     Current Outpatient Prescriptions  Medication Sig Dispense Refill  . adalimumab (HUMIRA PEN) 40 MG/0.8ML injection Inject 40 mg into the skin once. Injects every other week       . aspirin 81 MG tablet Take 81 mg by mouth daily.        . diclofenac-misoprostol (ARTHROTEC 75) 75-0.2 MG per tablet Take 1 tablet by mouth 2 (two) times daily.        Marland Kitchen diltiazem (CARDIZEM CD) 180 MG 24 hr capsule Take 1 capsule (180 mg total) by mouth daily.  30 capsule  6  . DIOVAN 80 MG tablet TAKE 1 TABLET BY MOUTH ONCE DAILY  30 tablet  5  . flecainide (TAMBOCOR) 150 MG tablet Take 1 tablet (150 mg total) by mouth 2 (two) times daily.  60 tablet  6  . furosemide (LASIX) 20 MG tablet TAKE 1 TABLET BY MOUTH MON, WED, AND  FRIDAY  12 tablet  8  . KLOR-CON M20 20 MEQ tablet take 1 tablet by mouth EVERY MON, WED AND FRI  12 tablet  8  . lovastatin (MEVACOR) 20 MG tablet take 1 tablet by mouth at bedtime  30 tablet  7  . Multiple Vitamin (MULTIVITAMIN) tablet Take 1 tablet by mouth daily.        Marland Kitchen NIASPAN 500 MG CR tablet TAKE 1 TABLET BY MOUTH AT BEDTIME AS DIRECTED AND TAKE 1 ASPIRIN 1/2 HOUR PRIOR TO NIASPAN  30 tablet  9  . Omega-3 Fatty Acids (FISH OIL) 1200 MG CAPS Take 1 capsule by mouth daily.       Marland Kitchen warfarin (COUMADIN) 5 MG tablet take as directed BY ANTICOAGULATION CLINIC  60 tablet  3     PHYSICAL EXAM: Filed Vitals:   05/09/11 1250  BP: 118/80  Pulse: 55   General:   He is in no acute distress, ambulates around the clinic without any respiratory difficulty. HEENT:  Normal. NECK:  Supple and thick.  No obvious JVD.  Carotids are 2+ bilaterally without any bruits.  There is no lymphadenopathy or thyromegaly. CARDIAC:  PMI is not palpable.  He is irregular.  No obvious murmurs. LUNGS:  Clear. ABDOMEN:  Obese, nontender,  and nondistended.   EXTREMITIES:  Warm with no cyanosis, clubbing. 1+ edema.  +psoriatic plaques  Good pulses. NEURO:  Alert and oriented x3.  Cranial nerves II through XII are intact.  He moves all fours without difficulty.  Affect is pleasant.   ECG:  AF 82 No ST-T wave abnormalities.       ASSESSMENT & PLAN:

## 2011-05-09 NOTE — Assessment & Plan Note (Signed)
Edema is worse while in AF. Will double lasix for several days. Volume management should be better once back in NSR.

## 2011-05-10 ENCOUNTER — Ambulatory Visit (HOSPITAL_COMMUNITY)
Admission: RE | Admit: 2011-05-10 | Discharge: 2011-05-10 | Disposition: A | Discharge status: Home / Self Care | Payer: BC Managed Care – PPO | Source: Ambulatory Visit | Attending: Internal Medicine | Admitting: Internal Medicine

## 2011-05-10 DIAGNOSIS — Z01812 Encounter for preprocedural laboratory examination: Secondary | ICD-10-CM | POA: Insufficient documentation

## 2011-05-10 DIAGNOSIS — Z7901 Long term (current) use of anticoagulants: Secondary | ICD-10-CM | POA: Insufficient documentation

## 2011-05-10 DIAGNOSIS — I4891 Unspecified atrial fibrillation: Secondary | ICD-10-CM

## 2011-05-10 DIAGNOSIS — Z01810 Encounter for preprocedural cardiovascular examination: Secondary | ICD-10-CM | POA: Insufficient documentation

## 2011-05-10 LAB — PROTIME-INR: INR: 3.1 — ABNORMAL HIGH (ref 0.00–1.49)

## 2011-05-10 LAB — CBC
HCT: 41.9 % (ref 39.0–52.0)
MCHC: 35.3 g/dL (ref 30.0–36.0)
MCV: 88.2 fL (ref 78.0–100.0)
Platelets: 148 10*3/uL — ABNORMAL LOW (ref 150–400)
RDW: 13.4 % (ref 11.5–15.5)
WBC: 3.6 10*3/uL — ABNORMAL LOW (ref 4.0–10.5)

## 2011-05-10 LAB — BASIC METABOLIC PANEL
BUN: 22 mg/dL (ref 6–23)
CO2: 25 mEq/L (ref 19–32)
Calcium: 8.9 mg/dL (ref 8.4–10.5)
Chloride: 106 mEq/L (ref 96–112)
Creatinine, Ser: 0.67 mg/dL (ref 0.50–1.35)

## 2011-05-15 NOTE — Cardiovascular Report (Signed)
  NAMEKYION, GAUTIER NO.:  [id number]  MEDICAL RECORD NO.:  [id number]  LOCATION:  [facility]                         FACILITY:  [facility]  PHYSICIAN:  Bevelyn Buckles. Criston Chancellor, MDDATE OF BIRTH:  20-Dec-1960  DATE OF PROCEDURE:  05/10/2011 DATE OF DISCHARGE:                           CARDIAC CATHETERIZATION   PATIENT INDICATION:  Mr. Summer is a very pleasant 50 year old male with a history of atrial fibrillation.  He was previously maintained in sinus rhythm on Tikosyn, but failed this.  We switched him over to flecainide and he presents today for elective cardioversion due to symptomatic atrial fibrillation despite rate control.  He has been on  Coumadin with therapeutic INRs.  DESCRIPTION OF PROCEDURE:  The risks and indication were explained. Consent was signed and placed on chart.  Appropriate time-out was taken. He underwent sedation under the direction of Dr. Hart Robinsons. After appropriate sedation, he received 3 attempts with 200 joule biphasic shocks; however, these were unsuccessful despite me while holding the anterior pad.  CONCLUSION:  Unsuccessful direct current cardioversion.  We will stop flecainide and consider atrial fibrillation ablation.     Bevelyn Buckles. Wray Goehring, MD     DRB/MEDQ  D:  05/10/2011  T:  05/10/2011  Job:  [id number]  Electronically Signed by Arvilla Meres MD on 05/15/2011 02:48:48 PM

## 2011-05-20 ENCOUNTER — Ambulatory Visit (INDEPENDENT_AMBULATORY_CARE_PROVIDER_SITE_OTHER): Payer: BC Managed Care – PPO | Admitting: *Deleted

## 2011-05-20 ENCOUNTER — Telehealth (HOSPITAL_COMMUNITY): Payer: Self-pay | Admitting: *Deleted

## 2011-05-20 DIAGNOSIS — I4891 Unspecified atrial fibrillation: Secondary | ICD-10-CM

## 2011-05-20 NOTE — Telephone Encounter (Signed)
Mr Chad called, he would like you to call him.  He is wondering what his timeline is going to be for his next procedure.

## 2011-05-20 NOTE — Telephone Encounter (Signed)
Spoke w/pt I will get back to him next week after discussing with Dr Gala Romney

## 2011-06-06 ENCOUNTER — Ambulatory Visit (HOSPITAL_COMMUNITY)
Admission: RE | Admit: 2011-06-06 | Discharge: 2011-06-06 | Disposition: A | Discharge status: Home / Self Care | Payer: BC Managed Care – PPO | Source: Ambulatory Visit | Attending: Internal Medicine | Admitting: Internal Medicine

## 2011-06-06 ENCOUNTER — Other Ambulatory Visit: Payer: Self-pay

## 2011-06-06 VITALS — BP 122/84 | HR 70 | Wt 384.5 lb

## 2011-06-06 DIAGNOSIS — I4891 Unspecified atrial fibrillation: Secondary | ICD-10-CM | POA: Insufficient documentation

## 2011-06-06 NOTE — Progress Notes (Signed)
HPI:  Mark Gonzalez is a very pleasant 50 y/o male with a history of psoriatic arthritis, obesity, hypertension, and atrial fibrillation.  He is maintained in sinus rhythm on Tikosyn.  He previously has a negative Myoview with an EF of 45-50%. His ejection fraction was thought to be mildly depressed due to presence of atrial fibrillation. Echo 9/11 EF 55-60%.  Seen in April for recurrent AF. Was scheduled for DC-CV but converted spontaneously after about 48 hours. Had recurrent AF in July and underwent DC-CV. But reverted back to AF in September but then converted spontaneously. However a week later went back into AF. Tikosyn stopped and flecainide started at 150 bid on September 14. 2012. Failed DC-CV after that. Flecainide stopped. Remains in AF since.    Feels fatigued. Low energy. No frank HF.  Occasional LE edema. No orthopnea or PND. No syncope or presyncope. INRs all therapeutic.   Scheduled to see Dr. Deno Lunger on Wednesday November 7 to discuss ablation.    ROS: All systems negative except as listed in HPI, PMH and Problem List.  Past Medical History  Diagnosis Date  . Paroxysmal atrial fibrillation     s/p DCCV 09/2007  and 11/2007 with reversion to AF, now on Tikosyn and chronic coumadin, EF 60%, myoview 2009 EF 45-50% in setting of AF, no ischemia or scar  . Morbid obesity   . Hypertension   . Psoriatic arthritis     Current Outpatient Prescriptions  Medication Sig Dispense Refill  . adalimumab (HUMIRA PEN) 40 MG/0.8ML injection Inject 40 mg into the skin once. Injects every other week       . aspirin 81 MG tablet Take 81 mg by mouth daily.        . diclofenac-misoprostol (ARTHROTEC 75) 75-0.2 MG per tablet Take 1 tablet by mouth 2 (two) times daily.        Marland Kitchen diltiazem (CARDIZEM CD) 180 MG 24 hr capsule Take 1 capsule (180 mg total) by mouth daily.  30 capsule  6  . DIOVAN 80 MG tablet TAKE 1 TABLET BY MOUTH ONCE DAILY  30 tablet  5  . furosemide (LASIX) 20 MG tablet TAKE 1 TABLET BY  MOUTH MON, WED, AND FRIDAY  12 tablet  8  . KLOR-CON M20 20 MEQ tablet take 1 tablet by mouth EVERY MON, WED AND FRI  12 tablet  8  . lovastatin (MEVACOR) 20 MG tablet take 1 tablet by mouth at bedtime  30 tablet  7  . Multiple Vitamin (MULTIVITAMIN) tablet Take 1 tablet by mouth daily.        Marland Kitchen NIASPAN 500 MG CR tablet TAKE 1 TABLET BY MOUTH AT BEDTIME AS DIRECTED AND TAKE 1 ASPIRIN 1/2 HOUR PRIOR TO NIASPAN  30 tablet  9  . Omega-3 Fatty Acids (FISH OIL) 1200 MG CAPS Take 1 capsule by mouth daily.       Marland Kitchen warfarin (COUMADIN) 5 MG tablet take as directed BY ANTICOAGULATION CLINIC  60 tablet  3     PHYSICAL EXAM: Filed Vitals:   06/06/11 1323  BP: 122/84  Pulse: 70   General:   He is in no acute distress, ambulates around the clinic without any respiratory difficulty. HEENT:  Normal. NECK:  Supple and thick.  No obvious JVD.  Carotids are 2+ bilaterally without any bruits.  There is no lymphadenopathy or thyromegaly. CARDIAC:  PMI is not palpable.  He is irregular.  No obvious murmurs. LUNGS:  Clear. ABDOMEN:  Obese, nontender, and nondistended.  EXTREMITIES:  Warm with no cyanosis, clubbing. 1+ edema.  +psoriatic plaques  Good pulses. NEURO:  Alert and oriented x3.  Cranial nerves II through XII are intact.  He moves all fours without difficulty.  Affect is pleasant.   ECG:  AF 84 No ST-T wave abnormalities.     ASSESSMENT & PLAN:

## 2011-06-06 NOTE — Assessment & Plan Note (Addendum)
Reviewed AF ablation procedure. He will see Dr. Deno Lunger this week. We also discussed possibility of sleep study to evaluate for OSA as contributor to AF. He states that his wife is adamant that he doesn't snore or have apneic episodes. Despite this I do feel he has a high likelihood of having OSA.

## 2011-06-12 NOTE — Assessment & Plan Note (Signed)
See discussion re: OSA in AF section. He is motivated to lose weight and make changes. Congratulated him on this.

## 2011-06-12 NOTE — Assessment & Plan Note (Signed)
Blood pressure well controlled. Continue current regimen.  

## 2011-06-17 ENCOUNTER — Ambulatory Visit (INDEPENDENT_AMBULATORY_CARE_PROVIDER_SITE_OTHER): Payer: BC Managed Care – PPO | Admitting: *Deleted

## 2011-06-17 DIAGNOSIS — I4891 Unspecified atrial fibrillation: Secondary | ICD-10-CM

## 2011-06-24 ENCOUNTER — Ambulatory Visit (INDEPENDENT_AMBULATORY_CARE_PROVIDER_SITE_OTHER): Payer: BC Managed Care – PPO | Admitting: *Deleted

## 2011-06-24 DIAGNOSIS — I4891 Unspecified atrial fibrillation: Secondary | ICD-10-CM

## 2011-07-01 ENCOUNTER — Ambulatory Visit (INDEPENDENT_AMBULATORY_CARE_PROVIDER_SITE_OTHER): Payer: BC Managed Care – PPO | Admitting: *Deleted

## 2011-07-01 DIAGNOSIS — I4891 Unspecified atrial fibrillation: Secondary | ICD-10-CM

## 2011-07-02 HISTORY — PX: ATRIAL FIBRILLATION ABLATION: SHX5732

## 2011-07-08 ENCOUNTER — Ambulatory Visit (INDEPENDENT_AMBULATORY_CARE_PROVIDER_SITE_OTHER): Payer: BC Managed Care – PPO | Admitting: *Deleted

## 2011-07-08 DIAGNOSIS — I4891 Unspecified atrial fibrillation: Secondary | ICD-10-CM

## 2011-07-14 ENCOUNTER — Ambulatory Visit (INDEPENDENT_AMBULATORY_CARE_PROVIDER_SITE_OTHER): Payer: Self-pay | Admitting: *Deleted

## 2011-07-14 DIAGNOSIS — I4891 Unspecified atrial fibrillation: Secondary | ICD-10-CM

## 2011-07-14 LAB — POCT INR: INR: 2.8

## 2011-07-22 ENCOUNTER — Ambulatory Visit (INDEPENDENT_AMBULATORY_CARE_PROVIDER_SITE_OTHER): Payer: BC Managed Care – PPO | Admitting: *Deleted

## 2011-07-22 DIAGNOSIS — I4891 Unspecified atrial fibrillation: Secondary | ICD-10-CM

## 2011-07-22 LAB — POCT INR: INR: 2.7

## 2011-07-25 ENCOUNTER — Ambulatory Visit (INDEPENDENT_AMBULATORY_CARE_PROVIDER_SITE_OTHER): Payer: BC Managed Care – PPO | Admitting: *Deleted

## 2011-07-25 DIAGNOSIS — I4891 Unspecified atrial fibrillation: Secondary | ICD-10-CM

## 2011-07-25 LAB — POCT INR: INR: 2.3

## 2011-07-29 ENCOUNTER — Ambulatory Visit (INDEPENDENT_AMBULATORY_CARE_PROVIDER_SITE_OTHER): Payer: BC Managed Care – PPO | Admitting: *Deleted

## 2011-07-29 DIAGNOSIS — I4891 Unspecified atrial fibrillation: Secondary | ICD-10-CM

## 2011-07-29 LAB — POCT INR: INR: 2.5

## 2011-08-02 ENCOUNTER — Other Ambulatory Visit: Payer: Self-pay | Admitting: Internal Medicine

## 2011-08-03 ENCOUNTER — Ambulatory Visit (INDEPENDENT_AMBULATORY_CARE_PROVIDER_SITE_OTHER): Payer: BC Managed Care – PPO | Admitting: *Deleted

## 2011-08-03 DIAGNOSIS — I4891 Unspecified atrial fibrillation: Secondary | ICD-10-CM

## 2011-08-03 LAB — POCT INR: INR: 2.9

## 2011-08-17 ENCOUNTER — Ambulatory Visit (HOSPITAL_COMMUNITY)
Admission: RE | Admit: 2011-08-17 | Discharge: 2011-08-17 | Disposition: A | Discharge status: Home / Self Care | Payer: BC Managed Care – PPO | Source: Ambulatory Visit | Attending: Internal Medicine | Admitting: Internal Medicine

## 2011-08-17 DIAGNOSIS — S31809A Unspecified open wound of unspecified buttock, initial encounter: Secondary | ICD-10-CM | POA: Insufficient documentation

## 2011-08-17 DIAGNOSIS — R0989 Other specified symptoms and signs involving the circulatory and respiratory systems: Secondary | ICD-10-CM

## 2011-08-17 DIAGNOSIS — X58XXXA Exposure to other specified factors, initial encounter: Secondary | ICD-10-CM | POA: Insufficient documentation

## 2011-08-17 MED ORDER — COLLAGENASE 250 UNIT/GM EX OINT: TOPICAL_OINTMENT | Freq: Every day | CUTANEOUS | Status: AC

## 2011-08-17 NOTE — Progress Notes (Signed)
Patient ID: Mark Gonzalez., male   DOB: June 06, 1961, 51 y.o.   MRN: [id number] HPI:  [name] is a very pleasant 51 y/o male with a history of psoriatic arthritis, obesity, hypertension, and atrial fibrillation.  He is maintained in sinus rhythm on Tikosyn.  He previously has a negative Myoview with an EF of 45-50%. His ejection fraction was thought to be mildly depressed due to presence of atrial fibrillation. Echo 9/11 EF 55-60%.  Seen in April for recurrent AF. Was scheduled for DC-CV but converted spontaneously after about 48 hours. Had recurrent AF in July and underwent DC-CV. But reverted back to AF in September but then converted spontaneously. However a week later went back into AF. Tikosyn stopped and flecainide started at 150 bid on September 14. 2012. Failed DC-CV after that. Flecainide stopped. Remains in AF since.    He is here for wound assessment. 12/17 Ablation developed tape blisters from defibrillator pads on the apex of the buttocks. Bed bound 13 hours after pro-cedure. He has been trying contact layer dressings for weeks and now it is getting worse. Increased necrotic tissue. Min-moderate exudate. Redness noted around the edges. Appetite ok.  Denies fever/chills.       ROS: All systems negative except as listed in HPI, PMH and Problem List.  Past Medical History  Diagnosis Date  . Paroxysmal atrial fibrillation     s/p DCCV 09/2007  and 11/2007 with reversion to AF, now on Tikosyn and chronic coumadin, EF 60%, myoview 2009 EF 45-50% in setting of AF, no ischemia or scar  . Morbid obesity   . Hypertension   . Psoriatic arthritis     Current Outpatient Prescriptions  Medication Sig Dispense Refill  . adalimumab (HUMIRA PEN) 40 MG/0.8ML injection Inject 40 mg into the skin once. Injects every other week       . aspirin 81 MG tablet Take 81 mg by mouth daily.        . diclofenac-misoprostol (ARTHROTEC 75) 75-0.2 MG per tablet Take 1 tablet by mouth 2 (two) times daily.          Marland Kitchen diltiazem (CARDIZEM CD) 180 MG 24 hr capsule Take 1 capsule (180 mg total) by mouth daily.  30 capsule  6  . DIOVAN 80 MG tablet TAKE 1 TABLET BY MOUTH ONCE DAILY  30 tablet  5  . furosemide (LASIX) 20 MG tablet TAKE 1 TABLET BY MOUTH MON, WED, AND FRIDAY  12 tablet  8  . KLOR-CON M20 20 MEQ tablet take 1 tablet by mouth EVERY MON, WED AND FRI  12 tablet  8  . lovastatin (MEVACOR) 20 MG tablet take 1 tablet by mouth at bedtime  30 tablet  7  . Multiple Vitamin (MULTIVITAMIN) tablet Take 1 tablet by mouth daily.        Marland Kitchen NIASPAN 500 MG CR tablet TAKE 1 TABLET BY MOUTH AT BEDTIME AS DIRECTED AND TAKE 1 ASPIRIN 1/2 HOUR PRIOR TO NIASPAN  30 tablet  9  . Omega-3 Fatty Acids (FISH OIL) 1200 MG CAPS Take 1 capsule by mouth daily.       Marland Kitchen warfarin (COUMADIN) 5 MG tablet take as directed BY ANTICOAGULATION CLINIC  60 tablet  3     PHYSICAL EXAM: There were no vitals filed for this visit. General:   He is in no acute distress, ambulates around the clinic without any respiratory difficulty. HEENT:  Normal. NECK:  Supple and thick.  No obvious JVD.  Carotids are 2+  bilaterally without any bruits.  There is no lymphadenopathy or thyromegaly. CARDIAC:  PMI is not palpable.  He is irregular.  No obvious murmurs. LUNGS:  Clear. ABDOMEN:  Obese, nontender, and nondistended.   EXTREMITIES:  Warm with no cyanosis, clubbing. 1+ edema.  +psoriatic plaques  Good pulses. NEURO:  Alert and oriented x3.  Cranial nerves II through XII are intact.  He moves all fours without difficulty.  Affect is pleasant. Skin: Full thickness wound apex of the buttocks. 10x7x2cm 95% necrotic tissue, moderate tan exudate, and odor  ECG:      ASSESSMENT & PLAN:
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
## 2011-08-17 NOTE — Patient Instructions (Signed)
  Wound Care Remove dressing and shower Apply Santyl to wound and cover with damp normal saline dressing Change daily    Follow up next week.

## 2011-08-23 ENCOUNTER — Telehealth (HOSPITAL_COMMUNITY): Payer: Self-pay | Admitting: *Deleted

## 2011-08-23 NOTE — Telephone Encounter (Signed)
Discussed current wound care. He is afebrile but does have chills and body aches. He is they purchased a ring to sit on and I have instructed them to stop using the ring due to the increased pressure to the surrounding tissue.  Instructed to limit sitting and try side lying position. Referral made to [facility] , August 29, 2011 at 9:00. They verbalized understanding.

## 2011-08-23 NOTE — Telephone Encounter (Signed)
Mark Gonzalez called today in regards to her husband.  He developed a fever last night, around 100 degrees, and she is concerned due to his wounds.  She would like a call back asap.  Mark Gonzalez has an appt to our office on Thursday, but she believes he needs to come sooner.  Thanks.

## 2011-08-25 ENCOUNTER — Other Ambulatory Visit: Payer: Self-pay

## 2011-08-25 ENCOUNTER — Encounter (HOSPITAL_COMMUNITY): Payer: Self-pay

## 2011-08-25 ENCOUNTER — Ambulatory Visit (HOSPITAL_COMMUNITY)
Admission: RE | Admit: 2011-08-25 | Discharge: 2011-08-25 | Disposition: A | Discharge status: Home / Self Care | Payer: BC Managed Care – PPO | Source: Ambulatory Visit | Attending: Internal Medicine | Admitting: Internal Medicine

## 2011-08-25 VITALS — BP 126/82 | HR 71 | Wt 376.8 lb

## 2011-08-25 DIAGNOSIS — X58XXXA Exposure to other specified factors, initial encounter: Secondary | ICD-10-CM | POA: Insufficient documentation

## 2011-08-25 DIAGNOSIS — I4891 Unspecified atrial fibrillation: Secondary | ICD-10-CM | POA: Insufficient documentation

## 2011-08-25 DIAGNOSIS — S31809A Unspecified open wound of unspecified buttock, initial encounter: Secondary | ICD-10-CM

## 2011-08-25 NOTE — Assessment & Plan Note (Addendum)
Remains in NSR.  Will follow up with Dr. Rosemary Holms in March.  Continue on coumadin, Tikosyn, and diltiazem.  Patient seen and examined with Ulyess Blossom PA-C. We discussed all aspects of the encounter. I agree with the assessment and plan as stated above. Doing well. Maintaining SR. Hopefully can stop Tikosyn soon.

## 2011-08-25 NOTE — Patient Instructions (Signed)
Continue current medications.  Follow up with Dr. Gala Romney in May.

## 2011-08-25 NOTE — Assessment & Plan Note (Signed)
Stable, continue current medications.  

## 2011-08-25 NOTE — Progress Notes (Signed)
HPI:  Mark Gonzalez is a very pleasant 51 y/o male with a history of psoriatic arthritis, obesity, hypertension, and atrial fibrillation.  He is maintained in sinus rhythm on Tikosyn.  He previously has a negative Myoview with an EF of 45-50%. His ejection fraction was thought to be mildly depressed due to presence of atrial fibrillation. Echo 9/11 EF 55-60%.  Seen in April for recurrent AF. Was scheduled for DC-CV but converted spontaneously after about 48 hours. Had recurrent AF in July and underwent DC-CV. But reverted back to AF in September but then converted spontaneously. However a week later went back into AF. Tikosyn stopped and flecainide started at 150 bid on September 14. 2012. Failed DC-CV after that. Flecainide stopped. Remains in AF since.  He underwent ablation on 12/17 at [facility].  Follow up with Dr. Deno Lunger on 3/13.    He returns for routine follow up today with his wife, Mark Gonzalez.  He c/o back pain that is causing him a lot of problems.  At this time it is limiting his exercise to almost none.  He feels he remains in sinus rhythm.  He did have fever/chills 4 days ago, he was evaluated by urgent care and it was felt to be a viral infection.  He has had no further fever/chills since that day.  States his wound on his lower back looks good without increased redness.  Remains on coumadin.  No problems with bleeding.    [name], NP evaluated the wound with debridement today.  ROS: All systems negative except as listed in HPI, PMH and Problem List.  Past Medical History  Diagnosis Date  . Paroxysmal atrial fibrillation     s/p DCCV 09/2007  and 11/2007 with reversion to AF, now on Tikosyn and chronic coumadin, EF 60%, myoview 2009 EF 45-50% in setting of AF, no ischemia or scar  . Morbid obesity   . Hypertension   . Psoriatic arthritis     Current Outpatient Prescriptions  Medication Sig Dispense Refill  . adalimumab (HUMIRA PEN) 40 MG/0.8ML injection Inject 40 mg into the skin once. Injects  every other week       . aspirin 81 MG tablet Take 81 mg by mouth daily.        . collagenase (SANTYL) ointment Apply topically daily. Apply daily to wound .  15 g  0  . diclofenac-misoprostol (ARTHROTEC 75) 75-0.2 MG per tablet Take 1 tablet by mouth 2 (two) times daily.        Marland Kitchen diltiazem (CARDIZEM CD) 180 MG 24 hr capsule Take 1 capsule (180 mg total) by mouth daily.  30 capsule  6  . DIOVAN 80 MG tablet TAKE 1 TABLET BY MOUTH ONCE DAILY  30 tablet  5  . furosemide (LASIX) 20 MG tablet TAKE 1 TABLET BY MOUTH MON, WED, AND FRIDAY  12 tablet  8  . KLOR-CON M20 20 MEQ tablet take 1 tablet by mouth EVERY MON, WED AND FRI  12 tablet  8  . lovastatin (MEVACOR) 20 MG tablet take 1 tablet by mouth at bedtime  30 tablet  7  . Multiple Vitamin (MULTIVITAMIN) tablet Take 1 tablet by mouth daily.        Marland Kitchen NIASPAN 500 MG CR tablet TAKE 1 TABLET BY MOUTH AT BEDTIME AS DIRECTED AND TAKE 1 ASPIRIN 1/2 HOUR PRIOR TO NIASPAN  30 tablet  9  . Omega-3 Fatty Acids (FISH OIL) 1200 MG CAPS Take 1 capsule by mouth daily.       Marland Kitchen  warfarin (COUMADIN) 5 MG tablet take as directed BY ANTICOAGULATION CLINIC  60 tablet  3     PHYSICAL EXAM: Filed Vitals:   08/25/11 0833  BP: 126/82  Pulse: 71  Weight: 376 lb 12 oz (170.893 kg)  SpO2: 98%   General:   He is in no acute distress, ambulates around the clinic without any respiratory difficulty. HEENT:  Normal. NECK:  Supple and thick.  No obvious JVD.  Carotids are 2+ bilaterally without any bruits.  There is no lymphadenopathy or thyromegaly. CARDIAC:  PMI is not palpable.  He is irregular.  No obvious murmurs. LUNGS:  Clear. ABDOMEN:  Obese, nontender, and nondistended.   EXTREMITIES:  Warm with no cyanosis, clubbing. trace edema.  +psoriatic plaques  Good pulses. NEURO:  Alert and oriented x3.  Cranial nerves II through XII are intact.  He moves all fours without difficulty.  Affect is pleasant. Skin: Full thickness wound apex of the buttocks. 10x7x2cm 5%  necrotic tissue, minimal serrous exudate, and no odor   ECG:  NSR 69 bpm    ASSESSMENT & PLAN:

## 2011-08-25 NOTE — Assessment & Plan Note (Addendum)
Improving.  Follow up with wound clinic next week.    Attending: Suspect this is likely radiation injury vs shear. Marked improvement after wound debridement. We redressed the wound today.

## 2011-08-29 ENCOUNTER — Encounter (HOSPITAL_BASED_OUTPATIENT_CLINIC_OR_DEPARTMENT_OTHER): Payer: BC Managed Care – PPO | Attending: Internal Medicine

## 2011-08-29 DIAGNOSIS — T2134XA Burn of third degree of lower back, initial encounter: Secondary | ICD-10-CM | POA: Insufficient documentation

## 2011-08-29 DIAGNOSIS — I10 Essential (primary) hypertension: Secondary | ICD-10-CM | POA: Insufficient documentation

## 2011-08-29 DIAGNOSIS — Z79899 Other long term (current) drug therapy: Secondary | ICD-10-CM | POA: Insufficient documentation

## 2011-08-29 DIAGNOSIS — I4891 Unspecified atrial fibrillation: Secondary | ICD-10-CM | POA: Insufficient documentation

## 2011-08-29 DIAGNOSIS — L4050 Arthropathic psoriasis, unspecified: Secondary | ICD-10-CM | POA: Insufficient documentation

## 2011-08-29 DIAGNOSIS — Z7901 Long term (current) use of anticoagulants: Secondary | ICD-10-CM | POA: Insufficient documentation

## 2011-08-29 DIAGNOSIS — T310 Burns involving less than 10% of body surface: Secondary | ICD-10-CM | POA: Insufficient documentation

## 2011-08-29 DIAGNOSIS — Y635 Inappropriate temperature in local application and packing: Secondary | ICD-10-CM | POA: Insufficient documentation
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
## 2011-08-31 ENCOUNTER — Ambulatory Visit (INDEPENDENT_AMBULATORY_CARE_PROVIDER_SITE_OTHER): Payer: BC Managed Care – PPO | Admitting: *Deleted

## 2011-08-31 DIAGNOSIS — Z7901 Long term (current) use of anticoagulants: Secondary | ICD-10-CM

## 2011-08-31 DIAGNOSIS — I4891 Unspecified atrial fibrillation: Secondary | ICD-10-CM

## 2011-09-05 ENCOUNTER — Other Ambulatory Visit: Payer: Self-pay | Admitting: Internal Medicine

## 2011-09-05 ENCOUNTER — Encounter (HOSPITAL_BASED_OUTPATIENT_CLINIC_OR_DEPARTMENT_OTHER): Payer: BC Managed Care – PPO | Attending: Internal Medicine

## 2011-09-05 DIAGNOSIS — Z79899 Other long term (current) drug therapy: Secondary | ICD-10-CM | POA: Insufficient documentation

## 2011-09-05 DIAGNOSIS — I4891 Unspecified atrial fibrillation: Secondary | ICD-10-CM | POA: Insufficient documentation

## 2011-09-05 DIAGNOSIS — Y838 Other surgical procedures as the cause of abnormal reaction of the patient, or of later complication, without mention of misadventure at the time of the procedure: Secondary | ICD-10-CM | POA: Insufficient documentation

## 2011-09-05 DIAGNOSIS — Z7901 Long term (current) use of anticoagulants: Secondary | ICD-10-CM | POA: Insufficient documentation

## 2011-09-05 DIAGNOSIS — T2134XA Burn of third degree of lower back, initial encounter: Secondary | ICD-10-CM | POA: Insufficient documentation

## 2011-09-05 DIAGNOSIS — I10 Essential (primary) hypertension: Secondary | ICD-10-CM | POA: Insufficient documentation

## 2011-09-05 DIAGNOSIS — L4050 Arthropathic psoriasis, unspecified: Secondary | ICD-10-CM | POA: Insufficient documentation

## 2011-09-05 DIAGNOSIS — Y9210 Unspecified residential institution as the place of occurrence of the external cause: Secondary | ICD-10-CM | POA: Insufficient documentation

## 2011-09-05 DIAGNOSIS — T3111 Burns involving 10-19% of body surface with 10-19% third degree burns: Secondary | ICD-10-CM | POA: Insufficient documentation

## 2011-09-05 DIAGNOSIS — X19XXXA Contact with other heat and hot substances, initial encounter: Secondary | ICD-10-CM | POA: Insufficient documentation

## 2011-09-26 ENCOUNTER — Encounter (HOSPITAL_BASED_OUTPATIENT_CLINIC_OR_DEPARTMENT_OTHER): Payer: BC Managed Care – PPO

## 2011-09-28 ENCOUNTER — Ambulatory Visit (INDEPENDENT_AMBULATORY_CARE_PROVIDER_SITE_OTHER): Payer: BC Managed Care – PPO | Admitting: Pharmacist

## 2011-09-28 DIAGNOSIS — I4891 Unspecified atrial fibrillation: Secondary | ICD-10-CM

## 2011-09-28 DIAGNOSIS — Z7901 Long term (current) use of anticoagulants: Secondary | ICD-10-CM

## 2011-10-03 ENCOUNTER — Encounter (HOSPITAL_BASED_OUTPATIENT_CLINIC_OR_DEPARTMENT_OTHER): Payer: BC Managed Care – PPO | Attending: Internal Medicine

## 2011-10-03 DIAGNOSIS — T3110 Burns involving 10-19% of body surface with 0% to 9% third degree burns: Secondary | ICD-10-CM | POA: Insufficient documentation

## 2011-10-03 DIAGNOSIS — Y9210 Unspecified residential institution as the place of occurrence of the external cause: Secondary | ICD-10-CM | POA: Insufficient documentation

## 2011-10-03 DIAGNOSIS — L4050 Arthropathic psoriasis, unspecified: Secondary | ICD-10-CM | POA: Insufficient documentation

## 2011-10-03 DIAGNOSIS — T2104XA Burn of unspecified degree of lower back, initial encounter: Secondary | ICD-10-CM | POA: Insufficient documentation

## 2011-10-03 DIAGNOSIS — X19XXXA Contact with other heat and hot substances, initial encounter: Secondary | ICD-10-CM | POA: Insufficient documentation

## 2011-10-03 DIAGNOSIS — I4891 Unspecified atrial fibrillation: Secondary | ICD-10-CM | POA: Insufficient documentation

## 2011-10-03 DIAGNOSIS — T3111 Burns involving 10-19% of body surface with 10-19% third degree burns: Secondary | ICD-10-CM | POA: Insufficient documentation

## 2011-10-03 DIAGNOSIS — I10 Essential (primary) hypertension: Secondary | ICD-10-CM | POA: Insufficient documentation

## 2011-10-03 DIAGNOSIS — Z79899 Other long term (current) drug therapy: Secondary | ICD-10-CM | POA: Insufficient documentation

## 2011-10-31 ENCOUNTER — Encounter (HOSPITAL_BASED_OUTPATIENT_CLINIC_OR_DEPARTMENT_OTHER): Payer: BC Managed Care – PPO | Attending: Internal Medicine

## 2011-10-31 DIAGNOSIS — T3110 Burns involving 10-19% of body surface with 0% to 9% third degree burns: Secondary | ICD-10-CM | POA: Insufficient documentation

## 2011-10-31 DIAGNOSIS — Z79899 Other long term (current) drug therapy: Secondary | ICD-10-CM | POA: Insufficient documentation

## 2011-10-31 DIAGNOSIS — T3111 Burns involving 10-19% of body surface with 10-19% third degree burns: Secondary | ICD-10-CM | POA: Insufficient documentation

## 2011-10-31 DIAGNOSIS — I4891 Unspecified atrial fibrillation: Secondary | ICD-10-CM | POA: Insufficient documentation

## 2011-10-31 DIAGNOSIS — I10 Essential (primary) hypertension: Secondary | ICD-10-CM | POA: Insufficient documentation

## 2011-10-31 DIAGNOSIS — T2104XA Burn of unspecified degree of lower back, initial encounter: Secondary | ICD-10-CM | POA: Insufficient documentation

## 2011-10-31 DIAGNOSIS — Y9210 Unspecified residential institution as the place of occurrence of the external cause: Secondary | ICD-10-CM | POA: Insufficient documentation

## 2011-10-31 DIAGNOSIS — L4050 Arthropathic psoriasis, unspecified: Secondary | ICD-10-CM | POA: Insufficient documentation

## 2011-10-31 DIAGNOSIS — X19XXXA Contact with other heat and hot substances, initial encounter: Secondary | ICD-10-CM | POA: Insufficient documentation

## 2011-11-04 ENCOUNTER — Other Ambulatory Visit (HOSPITAL_COMMUNITY): Payer: Self-pay | Admitting: Internal Medicine

## 2011-11-07 ENCOUNTER — Ambulatory Visit: Payer: Self-pay | Admitting: Cardiovascular Disease

## 2011-11-07 ENCOUNTER — Telehealth (HOSPITAL_COMMUNITY): Payer: Self-pay | Admitting: *Deleted

## 2011-11-07 DIAGNOSIS — I4891 Unspecified atrial fibrillation: Secondary | ICD-10-CM

## 2011-11-07 NOTE — Telephone Encounter (Signed)
Will let Dr Gala Romney and CV[facility] know

## 2011-11-07 NOTE — Telephone Encounter (Signed)
Ms Peifer called today to let us know that they have stopped Mark Gonzalez on coumadin.  He is just taking Asprin now.  They wanted you to be aware.  Thanks.

## 2011-11-21 ENCOUNTER — Telehealth (HOSPITAL_COMMUNITY): Payer: Self-pay | Admitting: *Deleted

## 2011-11-21 MED ORDER — FLECAINIDE ACETATE 100 MG PO TABS: 100.0000 mg | ORAL_TABLET | Freq: Two times a day (BID) | ORAL | Status: DC

## 2011-11-21 NOTE — Telephone Encounter (Signed)
Pt called on Friday and stated he had went back into a-fib, he had spoken w/PA and [facility] and they wanted him to restart Flecainide 100 mg bid and increase ASA to 325 mg adn have an EKG at our office, pt was going to come for that on Tue 4/23.  Pt called back this AM and stated that Sat pm he returned to SR, he has spoken w/PA at [facility] again this AM and was instructed to continue Flecainide and decrease ASA back to 81 mg daily, he can cancel EKG and keep his regular f/u w/Dr Bensimhon on 5/31

## 2011-11-27 ENCOUNTER — Other Ambulatory Visit: Payer: Self-pay | Admitting: Internal Medicine

## 2011-11-30 HISTORY — PX: CARDIAC CATHETERIZATION: SHX172

## 2011-12-05 ENCOUNTER — Encounter (HOSPITAL_BASED_OUTPATIENT_CLINIC_OR_DEPARTMENT_OTHER): Payer: BC Managed Care – PPO | Attending: Internal Medicine

## 2011-12-05 DIAGNOSIS — T2134XA Burn of third degree of lower back, initial encounter: Secondary | ICD-10-CM | POA: Insufficient documentation

## 2011-12-05 DIAGNOSIS — Y658 Other specified misadventures during surgical and medical care: Secondary | ICD-10-CM | POA: Insufficient documentation

## 2011-12-19 ENCOUNTER — Telehealth (HOSPITAL_COMMUNITY): Payer: Self-pay | Admitting: *Deleted

## 2011-12-19 MED ORDER — INFLIXIMAB 100 MG IV SOLR: INTRAVENOUS | Status: DC

## 2011-12-19 NOTE — Telephone Encounter (Signed)
Left mess on VM med ok, changed on his list

## 2011-12-19 NOTE — Telephone Encounter (Signed)
Mark Gonzalez called today to inform us of a new medication that he is taking.  He is taking remicade, for rheumatiod arthtitis.  He not taking [place] any more. This is taking its place.  Thanks.

## 2011-12-21 ENCOUNTER — Other Ambulatory Visit: Payer: Self-pay | Admitting: Internal Medicine

## 2011-12-21 NOTE — Telephone Encounter (Signed)
Refilled furosemide

## 2011-12-22 ENCOUNTER — Encounter (HOSPITAL_COMMUNITY): Payer: Self-pay | Admitting: Internal Medicine

## 2011-12-22 ENCOUNTER — Ambulatory Visit (HOSPITAL_BASED_OUTPATIENT_CLINIC_OR_DEPARTMENT_OTHER)
Admission: RE | Admit: 2011-12-22 | Discharge: 2011-12-22 | Disposition: A | Discharge status: Home / Self Care | Payer: BC Managed Care – PPO | Source: Ambulatory Visit | Attending: Internal Medicine | Admitting: Internal Medicine

## 2011-12-22 ENCOUNTER — Ambulatory Visit (HOSPITAL_COMMUNITY)
Admission: RE | Admit: 2011-12-22 | Discharge: 2011-12-22 | Disposition: A | Discharge status: Home / Self Care | Payer: BC Managed Care – PPO | Source: Ambulatory Visit | Attending: Internal Medicine | Admitting: Internal Medicine

## 2011-12-22 ENCOUNTER — Encounter (HOSPITAL_COMMUNITY)
Admission: RE | Disposition: A | Discharge status: Home / Self Care | Payer: Self-pay | Source: Ambulatory Visit | Attending: Internal Medicine

## 2011-12-22 ENCOUNTER — Encounter (HOSPITAL_COMMUNITY): Payer: Self-pay

## 2011-12-22 VITALS — BP 142/82 | HR 64 | Ht 72.0 in | Wt 386.4 lb

## 2011-12-22 DIAGNOSIS — I4891 Unspecified atrial fibrillation: Secondary | ICD-10-CM

## 2011-12-22 DIAGNOSIS — R0789 Other chest pain: Secondary | ICD-10-CM

## 2011-12-22 DIAGNOSIS — R079 Chest pain, unspecified: Secondary | ICD-10-CM

## 2011-12-22 HISTORY — PX: LEFT HEART CATHETERIZATION WITH CORONARY ANGIOGRAM: SHX5451

## 2011-12-22 LAB — CBC
MCH: 29.5 pg (ref 26.0–34.0)
MCHC: 34.5 g/dL (ref 30.0–36.0)
MCV: 85.5 fL (ref 78.0–100.0)
Platelets: 140 10*3/uL — ABNORMAL LOW (ref 150–400)
RDW: 13.8 % (ref 11.5–15.5)

## 2011-12-22 LAB — BASIC METABOLIC PANEL
Calcium: 9 mg/dL (ref 8.4–10.5)
Creatinine, Ser: 0.67 mg/dL (ref 0.50–1.35)
GFR calc Af Amer: >90 mL/min (ref >90)
GFR calc non Af Amer: >90 mL/min (ref >90)

## 2011-12-22 LAB — PROTIME-INR: Prothrombin Time: 13.1 seconds (ref 11.6–15.2)

## 2011-12-22 SURGERY — LEFT HEART CATHETERIZATION WITH CORONARY ANGIOGRAM
Anesthesia: LOCAL

## 2011-12-22 MED ORDER — MIDAZOLAM HCL 2 MG/2ML IJ SOLN
INTRAMUSCULAR | Status: AC
  Filled 2011-12-22: qty 2

## 2011-12-22 MED ORDER — ACETAMINOPHEN 325 MG PO TABS: 650.0000 mg | ORAL_TABLET | ORAL | Status: DC | PRN

## 2011-12-22 MED ORDER — SODIUM CHLORIDE 0.9 % IV SOLN: 250.0000 mL | INTRAVENOUS | Status: DC | PRN

## 2011-12-22 MED ORDER — ASPIRIN 81 MG PO CHEW
324.0000 mg | CHEWABLE_TABLET | ORAL | Status: AC
  Administered 2011-12-22: 324 mg via ORAL

## 2011-12-22 MED ORDER — FENTANYL CITRATE 0.05 MG/ML IJ SOLN
INTRAMUSCULAR | Status: AC
  Filled 2011-12-22: qty 2

## 2011-12-22 MED ORDER — SODIUM CHLORIDE 0.9 % IJ SOLN: 3.0000 mL | Freq: Two times a day (BID) | INTRAMUSCULAR | Status: DC

## 2011-12-22 MED ORDER — ONDANSETRON HCL 4 MG/2ML IJ SOLN: 4.0000 mg | Freq: Four times a day (QID) | INTRAMUSCULAR | Status: DC | PRN

## 2011-12-22 MED ORDER — SODIUM CHLORIDE 0.9 % IV SOLN
INTRAVENOUS | Status: AC
  Administered 2011-12-22: 16:00:00 via INTRAVENOUS

## 2011-12-22 MED ORDER — DIAZEPAM 5 MG PO TABS
ORAL_TABLET | ORAL | Status: AC
  Administered 2011-12-22: 5 mg via ORAL
  Filled 2011-12-22: qty 1

## 2011-12-22 MED ORDER — LIDOCAINE HCL (PF) 1 % IJ SOLN
INTRAMUSCULAR | Status: AC
  Filled 2011-12-22: qty 30

## 2011-12-22 MED ORDER — ASPIRIN 81 MG PO CHEW
CHEWABLE_TABLET | ORAL | Status: AC
  Administered 2011-12-22: 324 mg via ORAL
  Filled 2011-12-22: qty 4

## 2011-12-22 MED ORDER — DIAZEPAM 5 MG PO TABS
5.0000 mg | ORAL_TABLET | Freq: Once | ORAL | Status: AC
  Administered 2011-12-22: 5 mg via ORAL

## 2011-12-22 MED ORDER — SODIUM CHLORIDE 0.9 % IJ SOLN: 3.0000 mL | INTRAMUSCULAR | Status: DC | PRN

## 2011-12-22 MED ORDER — HEPARIN (PORCINE) IN NACL 2-0.9 UNIT/ML-% IJ SOLN
INTRAMUSCULAR | Status: AC
  Filled 2011-12-22: qty 2000

## 2011-12-22 MED ORDER — SODIUM CHLORIDE 0.9 % IV SOLN: INTRAVENOUS | Status: AC

## 2011-12-22 MED ORDER — NITROGLYCERIN 0.2 MG/ML ON CALL CATH LAB
INTRAVENOUS | Status: AC
  Filled 2011-12-22: qty 1

## 2011-12-22 NOTE — H&P (View-Only) (Signed)
Patient ID: Mark W Tennyson Jr., male   DOB: 03/30/1961, 51 y.o.   MRN: [id number] HPI:  Mark Gonzalez is a very pleasant 51 y/o male with a history of psoriatic arthritis, obesity, hypertension, and atrial fibrillation. He is s/p AF ablation at [facility] and maintained in NSR on flecainide. (Previous on Tikosyn).  He previously has a negative Myoview with an EF of 45-50%. His ejection fraction was thought to be mildly depressed due to presence of atrial fibrillation. Echo 9/11 EF 55-60%.  Here for unscheduled visit due to chest tightness. Has been struggling with his arthritis. Received his first dose of Remicade two weeks ago. Last week began to have chest tightness. Feels like it is always there and something is sitting on him. Feels like it did when he was out of rhythm but he has been in NSR. Was also lifting some stereo equipment and wonders if he strained his chest.   Chest tightness doesn't get worth with exertion but there are times when it gets more prominent. He is very concerned about it. Dad had MI at 43 with CABG.   ROS: All systems negative except as listed in HPI, PMH and Problem List.  Past Medical History  Diagnosis Date  . Paroxysmal atrial fibrillation     s/p DCCV 09/2007  and 11/2007 with reversion to AF, now on Tikosyn and chronic coumadin, EF 60%, myoview 2009 EF 45-50% in setting of AF, no ischemia or scar  . Morbid obesity   . Hypertension   . Psoriatic arthritis    History   Social History  . Marital Status: Married    Spouse Name: N/A    Number of Children: N/A  . Years of Education: N/A   Social History Main Topics  . Smoking status: Former Smoker    Types: Cigarettes    Quit date: 08/01/1990  . Smokeless tobacco: None  . Alcohol Use: 1.8 - 2.4 oz/week    3-4 Glasses of wine per week  . Drug Use: No  . Sexually Active: None   Other Topics Concern  . None   Social History Narrative   He is married to his wife for 20 years.  He has one child.  He is self employed as  a musical contractor/musician.  He actually went to high school with [employer] here in anesthesiology.     Family Hs: father with MI in his 40s.   Current Outpatient Prescriptions  Medication Sig Dispense Refill  . aspirin 81 MG tablet Take 81 mg by mouth daily.        . cetirizine (ZYRTEC) 10 MG tablet Take 10 mg by mouth as directed. TAKE 1 TABLET X 3 DAYS BEFORE THE REMACAID INJECTION      . cholecalciferol (VITAMIN D) 1000 UNITS tablet Take 1,000 Units by mouth daily.      . diclofenac-misoprostol (ARTHROTEC 75) 75-0.2 MG per tablet Take 1 tablet by mouth 2 (two) times daily.        . diltiazem (CARDIZEM CD) 180 MG 24 hr capsule take 1 capsule by mouth once daily  30 capsule  6  . DIOVAN 80 MG tablet TAKE 1 TABLET BY MOUTH ONCE DAILY  30 tablet  5  . flecainide (TAMBOCOR) 100 MG tablet Take 1 tablet (100 mg total) by mouth 2 (two) times daily.      . furosemide (LASIX) 20 MG tablet TAKE 1 TABLET BY MOUTH MON, WED, AND FRIDAY  12 tablet  8  . inFLIXimab (REMICADE) 100   MG injection As direceted  1 each    . KLOR-CON M20 20 MEQ tablet take 1 tablet by mouth EVERY MON, WED AND FRI  12 tablet  8  . lovastatin (MEVACOR) 20 MG tablet take 1 tablet by mouth at bedtime  30 tablet  7  . Multiple Vitamin (MULTIVITAMIN) tablet Take 1 tablet by mouth daily.        . NIASPAN 500 MG CR tablet TAKE 1 TABLET BY MOUTH AT BEDTIME AS DIRECTED AND TAKE 1 ASPIRIN 1/2 HOUR PRIOR TO NIASPAN  30 tablet  9  . Omega-3 Fatty Acids (FISH OIL) 1200 MG CAPS Take 1 capsule by mouth 2 (two) times daily.       . ranitidine (ZANTAC) 150 MG tablet Take 150 mg by mouth as directed. TAKE 1 TABLET X 3 DAYS BEFORE THE REMACAID INJECTION         PHYSICAL EXAM: Filed Vitals:   12/22/11 1409  BP: 142/82  Pulse: 64  Height: 6' (1.829 m)  Weight: 386 lb 6.4 oz (175.27 kg)   General:   He is in no acute distress, ambulates around the clinic without any respiratory difficulty. HEENT:  Normal. NECK:  Supple and thick.  No  obvious JVD.  Carotids are 2+ bilaterally without any bruits.  There is no lymphadenopathy or thyromegaly. CARDIAC:  PMI is not palpable.  He is irregular.  No obvious murmurs. LUNGS:  Clear. ABDOMEN:  Obese, nontender, and nondistended.   EXTREMITIES:  Warm with no cyanosis, clubbing. trace edema.  +psoriatic plaques  Good pulses. NEURO:  Alert and oriented x3.  Cranial nerves II through XII are intact.  He moves all fours without difficulty.  Affect is pleasant. Skin: Full thickness wound apex of the buttocks. 10x7x2cm 5% necrotic tissue, minimal serrous exudate, and no odor   ECG:  NSR 66 bpm No ST-T wave abnormalities.      ASSESSMENT & PLAN:  

## 2011-12-22 NOTE — Discharge Instructions (Signed)
Radial Site Care Refer to this sheet in the next few weeks. These instructions provide you with information on caring for yourself after your procedure. Your caregiver may also give you more specific instructions. Your treatment has been planned according to current medical practices, but problems sometimes occur. Call your caregiver if you have any problems or questions after your procedure. HOME CARE INSTRUCTIONS  You may shower the day after the procedure.Remove the bandage (dressing) and gently wash the site with plain soap and water.Gently pat the site dry.   Do not apply powder or lotion to the site.   Do not submerge the affected site in water for 3 to 5 days.   Inspect the site at least twice daily.   Do not flex or bend the affected arm for 24 hours.   No lifting over 5 pounds (2.3 kg) for 5 days after your procedure.   Do not drive home if you are discharged the same day of the procedure. Have someone else drive you.   You may drive 24 hours after the procedure unless otherwise instructed by your caregiver.   Do not operate machinery or power tools for 24 hours.   A responsible adult should be with you for the first 24 hours after you arrive home.  What to expect:  Any bruising will usually fade within 1 to 2 weeks.   Blood that collects in the tissue (hematoma) may be painful to the touch. It should usually decrease in size and tenderness within 1 to 2 weeks.  SEEK IMMEDIATE MEDICAL CARE IF:  You have unusual pain at the radial site.   You have redness, warmth, swelling, or pain at the radial site.   You have drainage (other than a small amount of blood on the dressing).   You have chills.   You have a fever or persistent symptoms for more than 72 hours.   You have a fever and your symptoms suddenly get worse.   Your arm becomes pale, cool, tingly, or numb.   You have heavy bleeding from the site. Hold pressure on the site.  Document Released: 08/20/2010  Document Revised: 07/07/2011 Document Reviewed: 08/20/2010 Exit[employer] Patient Information 2012 [employer]. 

## 2011-12-22 NOTE — Assessment & Plan Note (Signed)
Unclear etiology with typical and atypical features. Given RFs and family history I am concerned for underlying CAD. We discussed risks and indications of cardiac cath vs stress testing. With body habitus stress testing would be less sensitive. We have decided to proceed with cardiac cath via the radial artery. Will schedule for later today as he has important engagements later in the week that he would like to make, if possible.

## 2011-12-22 NOTE — Assessment & Plan Note (Signed)
Maintaining SR on flecainide.

## 2011-12-22 NOTE — Interval H&P Note (Signed)
History and Physical Interval Note:  12/22/2011 5:27 PM  Mark Gonzalez [name]  has presented today for surgery, with the diagnosis of Chest Pain  The various methods of treatment have been discussed with the patient and family. After consideration of risks, benefits and other options for treatment, the patient has consented to  Procedure(s) (LRB): LEFT HEART CATHETERIZATION WITH CORONARY ANGIOGRAM (N/A) as a surgical intervention .  The patients' history has been reviewed, patient examined, no change in status, stable for surgery.  I have reviewed the patients' chart and labs.  Questions were answered to the patient's satisfaction.     [name]

## 2011-12-22 NOTE — Progress Notes (Signed)
Patient ID: Mark Gonzalez., male   DOB: October 19, 1960, 51 y.o.   MRN: [id number] HPI:  Mark Gonzalez is a very pleasant 51 y/o male with a history of psoriatic arthritis, obesity, hypertension, and atrial fibrillation. He is s/p AF ablation at [facility] and maintained in NSR on flecainide. (Previous on Tikosyn).  He previously has a negative Myoview with an EF of 45-50%. His ejection fraction was thought to be mildly depressed due to presence of atrial fibrillation. Echo 9/11 EF 55-60%.  Here for unscheduled visit due to chest tightness. Has been struggling with his arthritis. Received his first dose of Remicade two weeks ago. Last week began to have chest tightness. Feels like it is always there and something is sitting on him. Feels like it did when he was out of rhythm but he has been in NSR. Was also lifting some stereo equipment and wonders if he strained his chest.   Chest tightness doesn't get worth with exertion but there are times when it gets more prominent. He is very concerned about it. Dad had MI at 24 with CABG.   ROS: All systems negative except as listed in HPI, PMH and Problem List.  Past Medical History  Diagnosis Date  . Paroxysmal atrial fibrillation     s/p DCCV 09/2007  and 11/2007 with reversion to AF, now on Tikosyn and chronic coumadin, EF 60%, myoview 2009 EF 45-50% in setting of AF, no ischemia or scar  . Morbid obesity   . Hypertension   . Psoriatic arthritis    History   Social History  . Marital Status: Married    Spouse Name: N/A    Number of Children: N/A  . Years of Education: N/A   Social History Main Topics  . Smoking status: Former Smoker    Types: Cigarettes    Quit date: 08/01/1990  . Smokeless tobacco: None  . Alcohol Use: 1.8 - 2.4 oz/week    3-4 Glasses of wine per week  . Drug Use: No  . Sexually Active: None   Other Topics Concern  . None   Social History Narrative   He is married to his wife for 20 years.  He has one child.  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     Family Hs: father with MI in his 32s.   Current Outpatient Prescriptions  Medication Sig Dispense Refill  . aspirin 81 MG tablet Take 81 mg by mouth daily.        . cetirizine (ZYRTEC) 10 MG tablet Take 10 mg by mouth as directed. TAKE 1 TABLET X 3 DAYS BEFORE THE REMACAID INJECTION      . cholecalciferol (VITAMIN D) 1000 UNITS tablet Take 1,000 Units by mouth daily.      . diclofenac-misoprostol (ARTHROTEC 75) 75-0.2 MG per tablet Take 1 tablet by mouth 2 (two) times daily.        Marland Kitchen diltiazem (CARDIZEM CD) 180 MG 24 hr capsule take 1 capsule by mouth once daily  30 capsule  6  . DIOVAN 80 MG tablet TAKE 1 TABLET BY MOUTH ONCE DAILY  30 tablet  5  . flecainide (TAMBOCOR) 100 MG tablet Take 1 tablet (100 mg total) by mouth 2 (two) times daily.      . furosemide (LASIX) 20 MG tablet TAKE 1 TABLET BY MOUTH MON, WED, AND FRIDAY  12 tablet  8  . inFLIXimab (REMICADE) 100  MG injection As direceted  1 each    . KLOR-CON M20 20 MEQ tablet take 1 tablet by mouth EVERY MON, WED AND FRI  12 tablet  8  . lovastatin (MEVACOR) 20 MG tablet take 1 tablet by mouth at bedtime  30 tablet  7  . Multiple Vitamin (MULTIVITAMIN) tablet Take 1 tablet by mouth daily.        Marland Kitchen NIASPAN 500 MG CR tablet TAKE 1 TABLET BY MOUTH AT BEDTIME AS DIRECTED AND TAKE 1 ASPIRIN 1/2 HOUR PRIOR TO NIASPAN  30 tablet  9  . Omega-3 Fatty Acids (FISH OIL) 1200 MG CAPS Take 1 capsule by mouth 2 (two) times daily.       . ranitidine (ZANTAC) 150 MG tablet Take 150 mg by mouth as directed. TAKE 1 TABLET X 3 DAYS BEFORE THE REMACAID INJECTION         PHYSICAL EXAM: Filed Vitals:   12/22/11 1409  BP: 142/82  Pulse: 64  Height: 6' (1.829 m)  Weight: 386 lb 6.4 oz (175.27 kg)   General:   He is in no acute distress, ambulates around the clinic without any respiratory difficulty. HEENT:  Normal. NECK:  Supple and thick.  No  obvious JVD.  Carotids are 2+ bilaterally without any bruits.  There is no lymphadenopathy or thyromegaly. CARDIAC:  PMI is not palpable.  He is irregular.  No obvious murmurs. LUNGS:  Clear. ABDOMEN:  Obese, nontender, and nondistended.   EXTREMITIES:  Warm with no cyanosis, clubbing. trace edema.  +psoriatic plaques  Good pulses. NEURO:  Alert and oriented x3.  Cranial nerves II through XII are intact.  He moves all fours without difficulty.  Affect is pleasant. Skin: Full thickness wound apex of the buttocks. 10x7x2cm 5% necrotic tissue, minimal serrous exudate, and no odor   ECG:  NSR 66 bpm No ST-T wave abnormalities.      ASSESSMENT & PLAN:

## 2011-12-22 NOTE — CV Procedure (Addendum)
Cardiac Cath Procedure Note:  Indication: Chest pressure  Procedures performed:  1) Selective coronary angiography 2) Left heart catheterization 3) Left ventriculogram  Description of procedure:   The risks and indication of the procedure were explained. Consent was signed and placed on the chart. An appropriate timeout was taken prior to the procedure. After a normal Allen's test was confirmed, the right wrist was prepped and draped in the routine sterile fashion and anesthetized with 1% local lidocaine.   A 5 FR arterial sheath was then placed in the right radial artery using a modified Seldinger technique. Systemic heparin was administered. 1.25 mg IV nicardipine was given through the sheath. Standard catheters including a JL 3.5, JL 3.0, JR4 and straight pigtail were used. All catheter exchanges were made over a wire.  Complications:  None apparent  Findings:  Ao Pressure:108/79 (93) LV Pressure: 134/1/11 There was no signficant gradient across the aortic valve on pullback.  Left main: Congenitally absent. LAD and LCX with separate ostia  LAD: Normal     LCX: Dominant Normal  RCA: Non-dominant. Normal  LV-gram done in the RAO projection: Ejection fraction = 60%. No wall motion abnormalities  Assessment:  1) Normal coronary arteries 2) Normal LV function   Plan/Discussion:  Continue risk factor modification. Consider PPI for chest pain.  [name] 6:17 PM     Arvilla Meres 6:14 PM

## 2011-12-23 MED FILL — Nicardipine HCl IV Soln 2.5 MG/ML: INTRAVENOUS | Qty: 1 | Status: AC

## 2011-12-23 NOTE — Progress Notes (Signed)
Encounter addended by: Simon Rhein on: 12/23/2011  8:38 AM<BR>     Documentation filed: Charges VN

## 2011-12-29 ENCOUNTER — Ambulatory Visit (HOSPITAL_COMMUNITY): Payer: BC Managed Care – PPO

## 2012-01-02 ENCOUNTER — Encounter (HOSPITAL_BASED_OUTPATIENT_CLINIC_OR_DEPARTMENT_OTHER): Payer: BC Managed Care – PPO | Attending: Internal Medicine

## 2012-01-02 DIAGNOSIS — Y658 Other specified misadventures during surgical and medical care: Secondary | ICD-10-CM | POA: Insufficient documentation

## 2012-01-02 DIAGNOSIS — T2134XA Burn of third degree of lower back, initial encounter: Secondary | ICD-10-CM | POA: Insufficient documentation

## 2012-01-05 ENCOUNTER — Other Ambulatory Visit: Payer: Self-pay | Admitting: Internal Medicine

## 2012-01-31 ENCOUNTER — Other Ambulatory Visit: Payer: Self-pay | Admitting: Internal Medicine

## 2012-05-22 ENCOUNTER — Other Ambulatory Visit (HOSPITAL_COMMUNITY): Payer: Self-pay | Admitting: *Deleted

## 2012-05-22 MED ORDER — FLECAINIDE ACETATE 100 MG PO TABS: 100.0000 mg | ORAL_TABLET | Freq: Two times a day (BID) | ORAL | Status: DC

## 2012-06-13 ENCOUNTER — Other Ambulatory Visit (HOSPITAL_COMMUNITY): Payer: Self-pay | Admitting: Internal Medicine

## 2012-08-02 ENCOUNTER — Other Ambulatory Visit: Payer: Self-pay | Admitting: Internal Medicine

## 2012-08-09 ENCOUNTER — Other Ambulatory Visit: Payer: Self-pay | Admitting: Internal Medicine

## 2012-08-15 ENCOUNTER — Ambulatory Visit (HOSPITAL_COMMUNITY)
Admission: RE | Admit: 2012-08-15 | Discharge: 2012-08-15 | Disposition: A | Discharge status: Home / Self Care | Payer: BC Managed Care – PPO | Source: Ambulatory Visit | Attending: Internal Medicine | Admitting: Internal Medicine

## 2012-08-15 ENCOUNTER — Encounter (HOSPITAL_COMMUNITY): Payer: Self-pay | Admitting: Cardiology

## 2012-08-15 ENCOUNTER — Encounter (HOSPITAL_COMMUNITY): Payer: Self-pay

## 2012-08-15 VITALS — BP 154/106 | HR 131 | Wt >= 6400 oz

## 2012-08-15 DIAGNOSIS — I4891 Unspecified atrial fibrillation: Secondary | ICD-10-CM | POA: Insufficient documentation

## 2012-08-15 DIAGNOSIS — I4892 Unspecified atrial flutter: Secondary | ICD-10-CM

## 2012-08-15 LAB — CBC WITH DIFFERENTIAL/PLATELET
Basophils Absolute: 0 10*3/uL (ref 0.0–0.1)
Eosinophils Absolute: 0.1 10*3/uL (ref 0.0–0.7)
Eosinophils Relative: 2 % (ref 0–5)
MCH: 31.7 pg (ref 26.0–34.0)
MCV: 88.4 fL (ref 78.0–100.0)
Platelets: 155 10*3/uL (ref 150–400)
RDW: 13.1 % (ref 11.5–15.5)

## 2012-08-15 LAB — BASIC METABOLIC PANEL
BUN: 19 mg/dL (ref 6–23)
CO2: 25 mEq/L (ref 19–32)
Calcium: 9 mg/dL (ref 8.4–10.5)
Creatinine, Ser: 0.64 mg/dL (ref 0.50–1.35)
Glucose, Bld: 106 mg/dL — ABNORMAL HIGH (ref 70–99)

## 2012-08-15 LAB — PROTIME-INR: Prothrombin Time: 12.4 seconds (ref 11.6–15.2)

## 2012-08-16 ENCOUNTER — Encounter (HOSPITAL_COMMUNITY)
Admission: RE | Disposition: A | Discharge status: Home / Self Care | Payer: Self-pay | Source: Ambulatory Visit | Attending: Internal Medicine

## 2012-08-16 ENCOUNTER — Encounter (HOSPITAL_COMMUNITY): Payer: Self-pay | Admitting: Certified Registered Nurse Anesthetist

## 2012-08-16 ENCOUNTER — Encounter (HOSPITAL_COMMUNITY): Payer: Self-pay

## 2012-08-16 ENCOUNTER — Ambulatory Visit (HOSPITAL_COMMUNITY)
Admission: RE | Admit: 2012-08-16 | Discharge: 2012-08-16 | Disposition: A | Discharge status: Home / Self Care | Payer: BC Managed Care – PPO | Source: Ambulatory Visit | Attending: Internal Medicine | Admitting: Internal Medicine

## 2012-08-16 ENCOUNTER — Other Ambulatory Visit (HOSPITAL_COMMUNITY): Payer: Self-pay | Admitting: Adult Health

## 2012-08-16 ENCOUNTER — Ambulatory Visit (HOSPITAL_COMMUNITY): Payer: BC Managed Care – PPO | Admitting: Certified Registered Nurse Anesthetist

## 2012-08-16 DIAGNOSIS — Z79899 Other long term (current) drug therapy: Secondary | ICD-10-CM | POA: Insufficient documentation

## 2012-08-16 DIAGNOSIS — I4892 Unspecified atrial flutter: Secondary | ICD-10-CM

## 2012-08-16 DIAGNOSIS — I10 Essential (primary) hypertension: Secondary | ICD-10-CM | POA: Insufficient documentation

## 2012-08-16 DIAGNOSIS — Z7902 Long term (current) use of antithrombotics/antiplatelets: Secondary | ICD-10-CM | POA: Insufficient documentation

## 2012-08-16 DIAGNOSIS — L4050 Arthropathic psoriasis, unspecified: Secondary | ICD-10-CM | POA: Insufficient documentation

## 2012-08-16 DIAGNOSIS — Z7982 Long term (current) use of aspirin: Secondary | ICD-10-CM | POA: Insufficient documentation

## 2012-08-16 HISTORY — DX: Cellulitis, unspecified: L03.90

## 2012-08-16 HISTORY — PX: CARDIOVERSION: SHX1299

## 2012-08-16 SURGERY — CARDIOVERSION
Anesthesia: Monitor Anesthesia Care

## 2012-08-16 MED ORDER — SODIUM CHLORIDE 0.9 % IV SOLN: INTRAVENOUS | Status: DC

## 2012-08-16 MED ORDER — SODIUM CHLORIDE 0.9 % IV SOLN
INTRAVENOUS | Status: DC | PRN
  Administered 2012-08-16 (×2): via INTRAVENOUS

## 2012-08-16 MED ORDER — PROPOFOL 10 MG/ML IV BOLUS
INTRAVENOUS | Status: DC | PRN
  Administered 2012-08-16: 110 mg via INTRAVENOUS

## 2012-08-16 NOTE — Anesthesia Preprocedure Evaluation (Signed)
Anesthesia Evaluation  Patient identified by MRN, date of birth, ID band Patient awake    Reviewed: Allergy & Precautions, H&P , NPO status , Patient's Chart, lab work & pertinent test results, reviewed documented beta blocker date and time   History of Anesthesia Complications Negative for: history of anesthetic complications  Airway Mallampati: II TM Distance: >3 FB Neck ROM: Full    Dental  (+) Teeth Intact and Dental Advisory Given   Pulmonary former smoker,    Pulmonary exam normal       Cardiovascular hypertension, Pt. on medications and Pt. on home beta blockers + dysrhythmias Atrial Fibrillation Rhythm:Irregular Rate:Abnormal  Previous cardiac ablation  -- Dec 2012 Previous cardiac cath--negative  --May 2013   Neuro/Psych negative neurological ROS  negative psych ROS   GI/Hepatic Neg liver ROS, GERD-  ,Pt takes Prilosec OTC prn   Endo/Other  negative endocrine ROS  Renal/GU negative Renal ROS  negative genitourinary   Musculoskeletal negative musculoskeletal ROS (+)   Abdominal (+) + obese,   Peds  Hematology negative hematology ROS (+)   Anesthesia Other Findings   Reproductive/Obstetrics                           Anesthesia Physical Anesthesia Plan  ASA: II  Anesthesia Plan: MAC   Post-op Pain Management:    Induction: Intravenous  Airway Management Planned: Simple Face Mask  Additional Equipment:   Intra-op Plan:   Post-operative Plan:   Informed Consent: I have reviewed the patients History and Physical, chart, labs and discussed the procedure including the risks, benefits and alternatives for the proposed anesthesia with the patient or authorized representative who has indicated his/her understanding and acceptance.     Plan Discussed with:   Anesthesia Plan Comments:         Anesthesia Quick Evaluation

## 2012-08-16 NOTE — CV Procedure (Signed)
     DIRECT CURRENT CARDIOVERSION  NAME:  Mark Gonzalez.   MRN: [id number] DOB:  Jul 09, 1961   ADMIT DATE: 08/16/2012   INDICATIONS: Atrial flutter   PROCEDURE:   Informed consent was obtained prior to the procedure. The risks, benefits and alternatives for the procedure were discussed and the patient comprehended these risks. Once an appropriate time out was taken, the patient had the defibrillator pads placed in the anterior and posterior position. The patient then underwent sedation by the anesthesia service with 110mg  IV propopafol. Once an appropriate level of sedation was achieved, the patient received a single biphasic, synchronized 200J shock with prompt conversion to sinus rhythm. No apparent complications.   CONLCUSION:   1.  Successful DC-CV of AFL.  Briona Korpela,MD 12:53 PM    [name] 12:45 PM

## 2012-08-16 NOTE — Transfer of Care (Signed)
Immediate Anesthesia Transfer of Care Note  Patient: Mark Gonzalez.  Procedure(s) Performed: Procedure(s) (LRB) with comments: CARDIOVERSION (N/A)  Patient Location: PACU  Anesthesia Type:MAC  Level of Consciousness: awake, alert  and oriented  Airway & Oxygen Therapy: Patient Spontanous Breathing  Post-op Assessment: Report given to PACU RN, Post -op Vital signs reviewed and stable and Patient moving all extremities  Post vital signs: Reviewed and stable  Complications: No apparent anesthesia complications

## 2012-08-16 NOTE — Anesthesia Postprocedure Evaluation (Signed)
  Anesthesia Post-op Note  Patient: Mark Gonzalez.  Procedure(s) Performed: Procedure(s) (LRB) with comments: CARDIOVERSION (N/A)  Patient Location: PACU and Endoscopy Unit  Anesthesia Type:MAC  Level of Consciousness: awake, alert  and oriented  Airway and Oxygen Therapy: Patient Spontanous Breathing  Post-op Pain: none  Post-op Assessment: Post-op Vital signs reviewed, Patient's Cardiovascular Status Stable, RESPIRATORY FUNCTION UNSTABLE, Patent Airway and No signs of Nausea or vomiting  Post-op Vital Signs: Reviewed and stable  Complications: No apparent anesthesia complications

## 2012-08-16 NOTE — H&P (View-Only) (Signed)
HPI:  Mark Gonzalez is a very pleasant 51 y/o male with a history of psoriatic arthritis, obesity, hypertension, and atrial fibrillation. He is s/p AF ablation at [facility] in 2012.  He was maintained on flecainide (Previous on Tikosyn).  Coumadin was discontinued in March of 2013 as he remained in NSR.  He previously has a negative Myoview with an EF of 45-50%. His ejection fraction was thought to be mildly depressed due to presence of atrial fibrillation. Echo 9/11 EF 55-60%.  He presented in May 2013 with chest pain and LHC was scheduled as Dad had MI at 43 with CABG.   LHC showed normal coronary arteries.    He returns for work in visit today for elevated HR.  He woke up this morning and felt his heart racing and mildly short of breath.  He checked his pulse and it was regular but in the 130s.  He was brought into the clinic and he EKG showed 2:1 atrial flutter at 131 bpm.  He denies dizziness/syncope.  No orthopnea/PND.  No chest pain.    ROS: All systems negative except as listed in HPI, PMH and Problem List.  Past Medical History  Diagnosis Date  . Paroxysmal atrial fibrillation     s/p DCCV 09/2007  and 11/2007 with reversion to AF, now on Tikosyn and chronic coumadin, EF 60%, myoview 2009 EF 45-50% in setting of AF, no ischemia or scar  . Morbid obesity   . Hypertension   . Psoriatic arthritis    History   Social History  . Marital Status: Married    Spouse Name: N/A    Number of Children: N/A  . Years of Education: N/A   Social History Main Topics  . Smoking status: Former Smoker    Types: Cigarettes    Quit date: 08/01/1990  . Smokeless tobacco: None  . Alcohol Use: 1.8 - 2.4 oz/week    3-4 Glasses of wine per week  . Drug Use: No  . Sexually Active: None   Other Topics Concern  . None   Social History Narrative   He is married to his wife for 20 years.  He has one child.  He is self employed as a musical contractor/musician.  He actually went to high school with [employer] here in  anesthesiology.     Family Hs: father with MI in his 40s.   Current Outpatient Prescriptions  Medication Sig Dispense Refill  . aspirin 81 MG tablet Take 81 mg by mouth daily.        . cetirizine (ZYRTEC) 10 MG tablet Take 10 mg by mouth as directed. TAKE 1 TABLET X 3 DAYS BEFORE THE REMACAID INJECTION      . cholecalciferol (VITAMIN D) 1000 UNITS tablet Take 1,000 Units by mouth daily.      . diclofenac-misoprostol (ARTHROTEC 75) 75-0.2 MG per tablet Take 1 tablet by mouth 2 (two) times daily.        . diltiazem (CARDIZEM CD) 180 MG 24 hr capsule take 1 capsule by mouth once daily  30 capsule  6  . DIOVAN 80 MG tablet TAKE 1 TABLET BY MOUTH ONCE DAILY  30 each  5  . flecainide (TAMBOCOR) 100 MG tablet Take 1 tablet (100 mg total) by mouth 2 (two) times daily.  180 tablet  3  . furosemide (LASIX) 20 MG tablet TAKE 1 TABLET BY MOUTH MON, WED, AND FRIDAY  12 tablet  8  . inFLIXimab (REMICADE) 100 MG injection As direceted    1 each    . KLOR-CON M20 20 MEQ tablet take 1 tablet by mouth EVERY MON, WED AND FRI  15 each  12  . lovastatin (MEVACOR) 20 MG tablet take 1 tablet by mouth at bedtime  30 tablet  7  . Multiple Vitamin (MULTIVITAMIN) tablet Take 1 tablet by mouth daily.        . NIASPAN 500 MG CR tablet TAKE 1 TABLET BY MOUTH AT BEDTIME AS DIRECTED AND TAKE 1 ASPIRIN 1/2 HOUR PRIOR TO NIASPAN  30 tablet  9  . Omega-3 Fatty Acids (FISH OIL) 1200 MG CAPS Take 1 capsule by mouth 2 (two) times daily.       . ranitidine (ZANTAC) 150 MG tablet Take 150 mg by mouth as directed. TAKE 1 TABLET X 3 DAYS BEFORE THE REMACAID INJECTION         PHYSICAL EXAM: Filed Vitals:   08/15/12 1508  BP: 154/106  Pulse: 131  Weight: 413 lb 12.8 oz (187.698 kg)  SpO2: 98%   General:   WDWN, no acute distress. HEENT:  Normal. NECK:  Supple and thick.  No obvious JVD.  Carotids are 2+ bilaterally without any bruits.  There is no lymphadenopathy or thyromegaly. CARDIAC:  PMI is not palpable.  Regular  tachycardic.  No obvious murmurs. LUNGS:  Clear. ABDOMEN:  Obese, nontender, and nondistended.   EXTREMITIES:  Warm with no cyanosis, clubbing. trace edema.  +psoriatic plaques  Good pulses. NEURO:  Alert and oriented x3.  Cranial nerves II through XII are intact.  He moves all fours without difficulty.  Affect is pleasant.   ECG:  Atrial flutter (2:1) at 131 bpm     ASSESSMENT & PLAN:  

## 2012-08-16 NOTE — Progress Notes (Addendum)
HPI:  Mark Gonzalez is a very pleasant 52 y/o male with a history of psoriatic arthritis, obesity, hypertension, and atrial fibrillation. He is s/p AF ablation at [facility] in 2012.  He was maintained on flecainide (Previous on Tikosyn).  Coumadin was discontinued in March of 2013 as he remained in NSR.  He previously has a negative Myoview with an EF of 45-50%. His ejection fraction was thought to be mildly depressed due to presence of atrial fibrillation. Echo 9/11 EF 55-60%.  He presented in May 2013 with chest pain and LHC was scheduled as Dad had MI at 60 with CABG.   LHC showed normal coronary arteries.    He returns for work in visit today for elevated HR.  He woke up this morning and felt his heart racing and mildly short of breath.  He checked his pulse and it was regular but in the 130s.  He was brought into the clinic and he EKG showed 2:1 atrial flutter at 131 bpm.  He denies dizziness/syncope.  No orthopnea/PND.  No chest pain.    ROS: All systems negative except as listed in HPI, PMH and Problem List.  Past Medical History  Diagnosis Date  . Paroxysmal atrial fibrillation     s/p DCCV 09/2007  and 11/2007 with reversion to AF, now on Tikosyn and chronic coumadin, EF 60%, myoview 2009 EF 45-50% in setting of AF, no ischemia or scar  . Morbid obesity   . Hypertension   . Psoriatic arthritis    History   Social History  . Marital Status: Married    Spouse Name: N/A    Number of Children: N/A  . Years of Education: N/A   Social History Main Topics  . Smoking status: Former Smoker    Types: Cigarettes    Quit date: 08/01/1990  . Smokeless tobacco: None  . Alcohol Use: 1.8 - 2.4 oz/week    3-4 Glasses of wine per week  . Drug Use: No  . Sexually Active: None   Other Topics Concern  . None   Social History Narrative   He is married to his wife for 20 years.  He has one child.  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  He actually went to high school with [employer] here in  anesthesiology.     Family Hs: father with MI in his 21s.   Current Outpatient Prescriptions  Medication Sig Dispense Refill  . aspirin 81 MG tablet Take 81 mg by mouth daily.        . cetirizine (ZYRTEC) 10 MG tablet Take 10 mg by mouth as directed. TAKE 1 TABLET X 3 DAYS BEFORE THE REMACAID INJECTION      . cholecalciferol (VITAMIN D) 1000 UNITS tablet Take 1,000 Units by mouth daily.      . diclofenac-misoprostol (ARTHROTEC 75) 75-0.2 MG per tablet Take 1 tablet by mouth 2 (two) times daily.        Marland Kitchen diltiazem (CARDIZEM CD) 180 MG 24 hr capsule take 1 capsule by mouth once daily  30 capsule  6  . DIOVAN 80 MG tablet TAKE 1 TABLET BY MOUTH ONCE DAILY  30 each  5  . flecainide (TAMBOCOR) 100 MG tablet Take 1 tablet (100 mg total) by mouth 2 (two) times daily.  180 tablet  3  . furosemide (LASIX) 20 MG tablet TAKE 1 TABLET BY MOUTH MON, WED, AND FRIDAY  12 tablet  8  . inFLIXimab (REMICADE) 100 MG injection As direceted  1 each    . KLOR-CON M20 20 MEQ tablet take 1 tablet by mouth EVERY MON, WED AND FRI  15 each  12  . lovastatin (MEVACOR) 20 MG tablet take 1 tablet by mouth at bedtime  30 tablet  7  . Multiple Vitamin (MULTIVITAMIN) tablet Take 1 tablet by mouth daily.        Marland Kitchen NIASPAN 500 MG CR tablet TAKE 1 TABLET BY MOUTH AT BEDTIME AS DIRECTED AND TAKE 1 ASPIRIN 1/2 HOUR PRIOR TO NIASPAN  30 tablet  9  . Omega-3 Fatty Acids (FISH OIL) 1200 MG CAPS Take 1 capsule by mouth 2 (two) times daily.       . ranitidine (ZANTAC) 150 MG tablet Take 150 mg by mouth as directed. TAKE 1 TABLET X 3 DAYS BEFORE THE REMACAID INJECTION         PHYSICAL EXAM: Filed Vitals:   08/15/12 1508  BP: 154/106  Pulse: 131  Weight: 413 lb 12.8 oz (187.698 kg)  SpO2: 98%   General:   WDWN, no acute distress. HEENT:  Normal. NECK:  Supple and thick.  No obvious JVD.  Carotids are 2+ bilaterally without any bruits.  There is no lymphadenopathy or thyromegaly. CARDIAC:  PMI is not palpable.  Regular  tachycardic.  No obvious murmurs. LUNGS:  Clear. ABDOMEN:  Obese, nontender, and nondistended.   EXTREMITIES:  Warm with no cyanosis, clubbing. trace edema.  +psoriatic plaques  Good pulses. NEURO:  Alert and oriented x3.  Cranial nerves II through XII are intact.  He moves all fours without difficulty.  Affect is pleasant.   ECG:  Atrial flutter (2:1) at 131 bpm     ASSESSMENT & PLAN:

## 2012-08-16 NOTE — Interval H&P Note (Signed)
History and Physical Interval Note:  08/16/2012 12:44 PM  Mark Gonzalez.  has presented today for surgery, with the diagnosis of a flutter  The various methods of treatment have been discussed with the patient and family. After consideration of risks, benefits and other options for treatment, the patient has consented to  Procedure(s) (LRB) with comments: CARDIOVERSION (N/A) as a surgical intervention .  The patient's history has been reviewed, patient examined, no change in status, stable for surgery.  I have reviewed the patient's chart and labs.  Questions were answered to the patient's satisfaction.     [name]

## 2012-08-16 NOTE — Assessment & Plan Note (Addendum)
Patient presents with atrial flutter that started this morning.  Have discussed the case with Dr. Gala Romney and the patient has been scheduled for DCCV tomorrow (1/16).  He has been in flutter <48 hours therefore no TEE is indicated at this time.  Have discussed this with the patient and he agrees to proceed with DCCV.  Continue diltiazem.  Labs today.

## 2012-08-17 ENCOUNTER — Encounter (HOSPITAL_COMMUNITY): Payer: Self-pay | Admitting: Internal Medicine

## 2012-08-17 NOTE — Addendum Note (Signed)
Encounter addended by: Almedia Balls on: 08/17/2012  8:10 AM<BR>     Documentation filed: Charges VN

## 2012-09-01 ENCOUNTER — Other Ambulatory Visit: Payer: Self-pay | Admitting: Internal Medicine

## 2012-09-08 ENCOUNTER — Other Ambulatory Visit: Payer: Self-pay | Admitting: Internal Medicine

## 2012-11-01 ENCOUNTER — Other Ambulatory Visit: Payer: Self-pay | Admitting: Internal Medicine

## 2013-01-11 ENCOUNTER — Other Ambulatory Visit (HOSPITAL_COMMUNITY): Payer: Self-pay | Admitting: *Deleted

## 2013-01-11 MED ORDER — DILTIAZEM HCL ER COATED BEADS 180 MG PO CP24: ORAL_CAPSULE | ORAL | Status: DC

## 2013-01-25 ENCOUNTER — Other Ambulatory Visit: Payer: Self-pay | Admitting: Internal Medicine

## 2013-04-22 ENCOUNTER — Other Ambulatory Visit: Payer: Self-pay | Admitting: Internal Medicine

## 2013-04-26 ENCOUNTER — Other Ambulatory Visit: Payer: Self-pay

## 2013-04-26 ENCOUNTER — Encounter (HOSPITAL_COMMUNITY): Payer: Self-pay

## 2013-04-26 ENCOUNTER — Emergency Department (HOSPITAL_COMMUNITY)
Admission: EM | Admit: 2013-04-26 | Discharge: 2013-04-26 | Disposition: A | Discharge status: Home / Self Care | Payer: BC Managed Care – PPO | Attending: Emergency Medicine | Admitting: Emergency Medicine

## 2013-04-26 ENCOUNTER — Emergency Department (HOSPITAL_COMMUNITY): Payer: BC Managed Care – PPO

## 2013-04-26 ENCOUNTER — Telehealth (HOSPITAL_COMMUNITY): Payer: Self-pay | Admitting: *Deleted

## 2013-04-26 DIAGNOSIS — I10 Essential (primary) hypertension: Secondary | ICD-10-CM | POA: Insufficient documentation

## 2013-04-26 DIAGNOSIS — I4891 Unspecified atrial fibrillation: Secondary | ICD-10-CM

## 2013-04-26 DIAGNOSIS — Z87891 Personal history of nicotine dependence: Secondary | ICD-10-CM | POA: Insufficient documentation

## 2013-04-26 DIAGNOSIS — Z7982 Long term (current) use of aspirin: Secondary | ICD-10-CM | POA: Insufficient documentation

## 2013-04-26 DIAGNOSIS — Z79899 Other long term (current) drug therapy: Secondary | ICD-10-CM | POA: Insufficient documentation

## 2013-04-26 DIAGNOSIS — Z872 Personal history of diseases of the skin and subcutaneous tissue: Secondary | ICD-10-CM | POA: Insufficient documentation

## 2013-04-26 HISTORY — DX: Unspecified atrial flutter: I48.92

## 2013-04-26 LAB — COMPREHENSIVE METABOLIC PANEL
ALT: 32 U/L (ref 0–53)
Alkaline Phosphatase: 62 U/L (ref 39–117)
BUN: 18 mg/dL (ref 6–23)
CO2: 23 mEq/L (ref 19–32)
Calcium: 8.7 mg/dL (ref 8.4–10.5)
Chloride: 103 mEq/L (ref 96–112)
GFR calc Af Amer: >90 mL/min (ref >90)
GFR calc non Af Amer: >90 mL/min (ref >90)
Glucose, Bld: 96 mg/dL (ref 70–99)
Potassium: 3.9 mEq/L (ref 3.5–5.1)
Sodium: 136 mEq/L (ref 135–145)

## 2013-04-26 LAB — CBC WITH DIFFERENTIAL/PLATELET
Eosinophils Relative: 2 % (ref 0–5)
Hemoglobin: 15 g/dL (ref 13.0–17.0)
Lymphocytes Relative: 37 % (ref 12–46)
Lymphs Abs: 1.5 10*3/uL (ref 0.7–4.0)
MCV: 88.6 fL (ref 78.0–100.0)
Monocytes Relative: 10 % (ref 3–12)
Neutrophils Relative %: 51 % (ref 43–77)
Platelets: 153 10*3/uL (ref 150–400)
RBC: 4.72 MIL/uL (ref 4.22–5.81)
RDW: 14.1 % (ref 11.5–15.5)
WBC: 4 10*3/uL (ref 4.0–10.5)

## 2013-04-26 MED ORDER — SODIUM CHLORIDE 0.9 % IV BOLUS (SEPSIS)
1000.0000 mL | Freq: Once | INTRAVENOUS | Status: AC
  Administered 2013-04-26: 1000 mL via INTRAVENOUS

## 2013-04-26 MED ORDER — PROPOFOL 10 MG/ML IV BOLUS
INTRAVENOUS | Status: AC | PRN
  Administered 2013-04-26: 90 mg via INTRAVENOUS

## 2013-04-26 MED ORDER — APIXABAN 5 MG PO TABS: 5.0000 mg | ORAL_TABLET | Freq: Two times a day (BID) | ORAL | Status: DC

## 2013-04-26 MED ORDER — PROPOFOL 10 MG/ML IV BOLUS
0.5000 mg/kg | Freq: Once | INTRAVENOUS | Status: DC
  Filled 2013-04-26: qty 20

## 2013-04-26 NOTE — Telephone Encounter (Signed)
Pt called to report he is back in a-fib, he states he woke up this am feeling "not right" he checked his BP with is cuff and it indicated irregular heart beat, he states he feels like it is irreg, he feels like his breathing is "labored" and just doesn't feel "right" per Dr Gala Romney have pt take extra 100 mg of flecanide now if not back into normal rhythm by 1pm come to ER to get converted, pt's wife aware and verbalizes understanding

## 2013-04-26 NOTE — ED Notes (Signed)
Consent obtained and placed in Pt chart

## 2013-04-26 NOTE — ED Notes (Signed)
Pt and wife comfortable with d/c and f/u instructions. Prescriptions x1

## 2013-04-26 NOTE — ED Notes (Signed)
Pt reports waking 0730 this am with his heart fluttering and SOB. Pt reports a hx of the same. Pt called Dr. Prescott Gum office they instructed him to take additional Flecainide 100 mg at 1130 am in addition to his daily dose and go to the ED if symptoms did not change. Pt reports his heart has been out of rhythm all day. Dr. Gala Romney is requesting [place] to call him upon pt's arrival to ED. Pt denies chest pain

## 2013-04-26 NOTE — ED Notes (Signed)
Dr. Gala Romney gave 200Joule Shock to patient. Pt in NSR at this time

## 2013-04-26 NOTE — ED Notes (Signed)
Family updated as to patient's status and at bedside  

## 2013-04-26 NOTE — ED Provider Notes (Signed)
CSN: [id number]     Arrival date & time 04/26/13  1330 History   None    Chief Complaint  Patient presents with  . Atrial Fibrillation   (Consider location/radiation/quality/duration/timing/severity/associated sxs/prior Treatment) HPI  Mark Pickel. is a 52 y.o. male complaining of with PMH significant for obesity, hypertension, paroxysmal AFib, s/p ineffective ablation at [facility] in 2012 followed by [facility] c/o sensation of heart fluttering onset this AM associated with increasing DOE. Patient is not anticoagulated. Pt took his HR this AM and it has been variable, highest of 115. Pt contacted Dr. Clarise Cruz and was instructed to take extra flecainide with no relief. Pt denies CP, PND, easing peripheral edema, fever, cough, abdominal pain, nausea vomiting, change in bowel or bladder habits. Patient endorses a difficulty sleeping which she has had for several months..   Past Medical History  Diagnosis Date  . Paroxysmal atrial fibrillation     s/p DCCV 09/2007  and 11/2007 with reversion to AF, now on Tikosyn and chronic coumadin, EF 60%, myoview 2009 EF 45-50% in setting of AF, no ischemia or scar  . Morbid obesity   . Hypertension   . Psoriatic arthritis   . Cellulitis    Past Surgical History  Procedure Laterality Date  . Ablation    . Cardioversion    . Appendectomy    . Foot surgery    . Cardioversion  08/16/2012    Procedure: CARDIOVERSION;  Surgeon: Dolores Patty, MD;  Location: [facility] ENDOSCOPY;  Service: Cardiovascular;  Laterality: N/A;   Family History  Problem Relation Age of Onset  . Heart disease Neg Hx    History  Substance Use Topics  . Smoking status: Former Smoker    Types: Cigarettes    Quit date: 08/01/1990  . Smokeless tobacco: Not on file  . Alcohol Use: 1.8 - 2.4 oz/week    3-4 Glasses of wine per week    Review of Systems 10 systems reviewed and found to be negative, except as noted in the HPI   Allergies  Lisinopril  Home Medications    Current Outpatient Rx  Name  Route  Sig  Dispense  Refill  . aspirin 81 MG tablet   Oral   Take 81 mg by mouth daily.           . cetirizine (ZYRTEC) 10 MG tablet   Oral   Take 10 mg by mouth as directed. TAKE 1 TABLET X 3 DAYS BEFORE THE REMACAID INJECTION         . cholecalciferol (VITAMIN D) 1000 UNITS tablet   Oral   Take 1,000 Units by mouth daily.         . diclofenac-misoprostol (ARTHROTEC 75) 75-0.2 MG per tablet   Oral   Take 1 tablet by mouth 2 (two) times daily.           Marland Kitchen diltiazem (CARDIZEM CD) 180 MG 24 hr capsule      take 1 capsule by mouth once daily   30 capsule   6   . DIOVAN 80 MG tablet      take 1 tablet by mouth once daily   30 tablet   6   . fish oil-omega-3 fatty acids 1000 MG capsule   Oral   Take 1 g by mouth daily.         . flecainide (TAMBOCOR) 100 MG tablet   Oral   Take 1 tablet (100 mg total) by mouth 2 (two) times daily.  180 tablet   3   . folic acid (FOLVITE) 1 MG tablet   Oral   Take 1 mg by mouth daily.         . furosemide (LASIX) 20 MG tablet   Oral   Take 20 mg by mouth every Monday, Wednesday, and Friday.         . inFLIXimab (REMICADE) 100 MG injection      As direceted   1 each      . lovastatin (MEVACOR) 20 MG tablet      take 1 tablet by mouth at bedtime   30 tablet   7   . METHOTREXATE SODIUM IJ   Injection   Inject 0.6 mLs as directed once a week.         . Multiple Vitamin (MULTIVITAMIN) tablet   Oral   Take 1 tablet by mouth daily.           . potassium chloride SA (K-DUR,KLOR-CON) 20 MEQ tablet   Oral   Take 20 mEq by mouth every Monday, Wednesday, and Friday.         . ranitidine (ZANTAC) 150 MG tablet   Oral   Take 150 mg by mouth as directed. TAKE 1 TABLET X 3 DAYS BEFORE THE REMACAID INJECTION         . valsartan (DIOVAN) 80 MG tablet   Oral   Take 80 mg by mouth daily.          BP 131/98  Pulse 108  Temp(Src) 98.1 F (36.7 C) (Oral)  Resp 20  Ht  6' (1.829 m)  Wt 413 lb (187.336 kg)  BMI 56 kg/m2  SpO2 96% Physical Exam  Nursing note and vitals reviewed. Constitutional: He is oriented to person, place, and time. He appears well-developed and well-nourished. No distress.  Obese  HENT:  Head: Normocephalic.  Eyes: Conjunctivae and EOM are normal.  Cardiovascular: Intact distal pulses.   Tachycardic around 120  Pulmonary/Chest: Effort normal and breath sounds normal. No stridor. No respiratory distress. He has no wheezes. He has no rales. He exhibits no tenderness.  Abdominal: Soft. Bowel sounds are normal. He exhibits no distension and no mass. There is no tenderness. There is no rebound and no guarding.  Musculoskeletal: Normal range of motion.  Neurological: He is alert and oriented to person, place, and time.  Psychiatric: He has a normal mood and affect.    ED Course  Procedures (including critical care time)  CRITICAL CARE Performed by: Wynetta Emery   Total critical care time: 35  Critical care time was exclusive of separately billable procedures and treating other patients.  Critical care was necessary to treat or prevent imminent or life-threatening deterioration.  Critical care was time spent personally by me on the following activities: development of treatment plan with patient and/or surrogate as well as nursing, discussions with consultants, evaluation of patient's response to treatment, examination of patient, obtaining history from patient or surrogate, ordering and performing treatments and interventions, ordering and review of laboratory studies, ordering and review of radiographic studies, pulse oximetry and re-evaluation of patient's condition.  Labs Review Labs Reviewed  CBC WITH DIFFERENTIAL  COMPREHENSIVE METABOLIC PANEL  POCT I-STAT TROPONIN I   Imaging Review Dg Chest 2 View  04/26/2013   CLINICAL DATA:  Short of breath. Atrial fibrillation  EXAM: CHEST  2 VIEW  COMPARISON:  None.   FINDINGS: The heart size and mediastinal contours are within normal limits. Both lungs are clear. The  visualized skeletal structures are unremarkable.  IMPRESSION: No active cardiopulmonary disease.   Electronically Signed   By: Marlan Palau M.D.   On: 04/26/2013 14:56    Date: 04/26/2013 13:50  Rate: 120  Rhythm: atrial fibrillation  QRS Axis: left  Intervals: normal  ST/T Wave abnormalities: nonspecific ST/T changes  Conduction Disutrbances:none  Narrative Interpretation:   Old EKG Reviewed: changes noted A Fib   Date: 04/26/2013 16:53  Rate: 74  Rhythm: normal sinus rhythm  QRS Axis: normal  Intervals: normal  ST/T Wave abnormalities: nonspecific T wave changes  Conduction Disutrbances:none  Narrative Interpretation:   Old EKG Reviewed: unchanged    MDM   1. A-fib     Filed Vitals:   04/26/13 1648 04/26/13 1700 04/26/13 1715 04/26/13 1730  BP: 106/37 105/76 110/66 110/68  Pulse: 88 78 68 68  Temp:      TempSrc:      Resp: 14 21 11 11   Height:      Weight:      SpO2: 98% 99% 100% 100%     Mark Zepeda. is a 52 y.o. male went back into atrial fibrillation this morning with heart rate in the 120s. Hemodynamically stable. Patient is status post ablation at [facility] and 2012. Echo and Myoview are unremarkable. Patient has mild associated dyspnea on exertion. Denies chest pain. Troponin and blood work are unremarkable. There are no signs of infection. Conscious sedation performed by Dr. Anitra Lauth, patient cardioverted by cardiologist Dr. Clarise Cruz.   Medications  propofol (DIPRIVAN) 10 mg/mL bolus/IV push 93.7 mg (93.7 mg Intravenous Not Given 04/26/13 1740)  sodium chloride 0.9 % bolus 1,000 mL (0 mLs Intravenous Stopped 04/26/13 1740)  propofol (DIPRIVAN) 10 mg/mL bolus/IV push ( Intravenous Stopped 04/26/13 1740)    Pt is hemodynamically stable, appropriate for, and amenable to discharge at this time. Pt verbalized understanding and agrees with care plan. All questions  answered. Outpatient follow-up and specific return precautions discussed.    Discharge Medication List as of 04/26/2013  5:00 PM    START taking these medications   Details  apixaban (ELIQUIS) 5 MG TABS tablet Take 1 tablet (5 mg total) by mouth 2 (two) times daily., Starting 04/26/2013, Until Discontinued, Print        Note: Portions of this report may have been transcribed using voice recognition software. Every effort was made to ensure accuracy; however, inadvertent computerized transcription errors may be present      Wynetta Emery, PA-C 04/26/13 1819

## 2013-04-26 NOTE — Consult Note (Signed)
Referring Physician: Dr. Anitra Lauth (ER) Primary Cardiologist: [facility]imhon Reason for Consultation: Recurrent AF   HPI:  Mark Gonzalez is a very pleasant 52 y/o male with a history of psoriatic arthritis, obesity, hypertension, and atrial fibrillation. He is s/p AF ablation at [facility] in 2012. He is maintained on flecainide (Previously on Tikosyn). Coumadin was discontinued in March of 2013 as he remained in NSR.  He presented in May 2013 with chest pain and LHC was scheduled as Dad had MI at 31 with CABG. LHC showed normal coronary arteries with normal EF.   He woke up this morning and felt a bit SOB. Felt his pulse to be irregular. (Was regular last night). He called me and I gave him an extra dose of 100mg  flecainide with no effect. Came to ER and found to have recurrent AF with RVR.    Review of Systems:     Cardiac Review of Systems: {Y] = yes [ ]  = no  Chest Pain [    ]  Resting SOB [   ] Exertional SOB  [Y  ]  Orthopnea [  ]   Pedal Edema [   ]    Palpitations [  ] Syncope  [  ]   Presyncope [   ]  General Review of Systems: [Y] = yes [  ]=no Constitional: recent weight change [  ]; anorexia [  ]; fatigue [  Y]; nausea [  ]; night sweats [  ]; fever [  ]; or chills [  ];                                                                                                                                          Dental: poor dentition[  ];   Eye : blurred vision [  ]; diplopia [   ]; vision changes [  ];  Amaurosis fugax[  ]; Resp: cough [  ];  wheezing[  ];  hemoptysis[  ]; shortness of breath[  ]; paroxysmal nocturnal dyspnea[  ]; dyspnea on exertion[  ]; or orthopnea[  ];  GI:  gallstones[  ], vomiting[  ];  dysphagia[  ]; melena[  ];  hematochezia [  ]; heartburn[  ];   Hx of  Colonoscopy[  ]; GU: kidney stones [  ]; hematuria[  ];   dysuria [  ];  nocturia[  ];  history of     obstruction [  ];                 Skin: rash, swelling[  ];, hair loss[  ];  peripheral edema[  ];  or itching[   ]; Musculosketetal: myalgias[  ];  joint swelling[  ];  joint erythema[  ];  joint pain[  ];  back pain[  ];  Heme/Lymph: bruising[  ];  bleeding[  ];  anemia[  ];  Neuro: TIA[  ];  headaches[  ];  stroke[  ];  vertigo[  ];  seizures[  ];   paresthesias[  ];  difficulty walking[  ];  Psych:depression[  ]; anxiety[  ];  Endocrine: diabetes[  ];  thyroid dysfunction[  ];  Immunizations: Flu [  ]; Pneumococcal[  ];  Other:  Past Medical History  Diagnosis Date  . Paroxysmal atrial fibrillation     s/p DCCV 09/2007  and 11/2007 with reversion to AF, now on Tikosyn and chronic coumadin, EF 60%, myoview 2009 EF 45-50% in setting of AF, no ischemia or scar  . Morbid obesity   . Hypertension   . Psoriatic arthritis   . Cellulitis   . Atrial flutter      (Not in a hospital admission)      Infusions: . propofol    . sodium chloride      Allergies  Allergen Reactions  . Lisinopril Other (See Comments)    REACTION: Cough    History   Social History  . Marital Status: Married    Spouse Name: N/A    Number of Children: N/A  . Years of Education: N/A   Occupational History  . Not on file.   Social History Main Topics  . Smoking status: Former Smoker    Types: Cigarettes    Quit date: 08/01/1990  . Smokeless tobacco: Not on file  . Alcohol Use: 1.8 - 2.4 oz/week    3-4 Glasses of wine per week  . Drug Use: No  . Sexual Activity: Not on file   Other Topics Concern  . Not on file   Social History Narrative   He is married to his wife for 20 years.  He has one child.  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      Family History  Problem Relation Age of Onset  . Heart disease Neg Hx     PHYSICAL EXAM: Filed Vitals:   04/26/13 1602  BP: 143/54  Pulse: 101  Temp:   Resp: 14    No intake or output data in the 24 hours ending 04/26/13 1642  General: WDWN, no acute distress.   HEENT: Normal.  NECK: Supple and thick. No obvious JVD. Carotids are 2+ bilaterally without any bruits. There is no lymphadenopathy or thyromegaly.  CARDIAC: PMI is not palpable. Irregular. Tachy. No obvious murmurs.  LUNGS: Clear.  ABDOMEN: Obese, nontender, and nondistended.  EXTREMITIES: Warm with no cyanosis, clubbing. trace edema. +psoriatic plaques Good pulses.  NEURO: Alert and oriented x3. Cranial nerves II through XII are intact. He moves all fours without difficulty. Affect is pleasant.    ECG: AF 120 LAD. No ST-T wave abnormalities.    Results for orders placed during the hospital encounter of 04/26/13 (from the past 24 hour(s))  CBC WITH DIFFERENTIAL     Status: None   Collection Time    04/26/13  3:27 PM      Result Value Range   WBC 4.0  4.0 - 10.5 K/uL   RBC 4.72  4.22 - 5.81 MIL/uL   Hemoglobin 15.0  13.0 - 17.0 g/dL   HCT 16.1  09.6 - 04.5 %   MCV 88.6  78.0 - 100.0 fL   MCH 31.8  26.0 - 34.0 pg   MCHC 35.9  30.0 - 36.0 g/dL   RDW 40.9  81.1 - 91.4 %   Platelets 153  150 - 400  K/uL   Neutrophils Relative % 51  43 - 77 %   Neutro Abs 2.1  1.7 - 7.7 K/uL   Lymphocytes Relative 37  12 - 46 %   Lymphs Abs 1.5  0.7 - 4.0 K/uL   Monocytes Relative 10  3 - 12 %   Monocytes Absolute 0.4  0.1 - 1.0 K/uL   Eosinophils Relative 2  0 - 5 %   Eosinophils Absolute 0.1  0.0 - 0.7 K/uL   Basophils Relative 0  0 - 1 %   Basophils Absolute 0.0  0.0 - 0.1 K/uL  COMPREHENSIVE METABOLIC PANEL     Status: None   Collection Time    04/26/13  3:27 PM      Result Value Range   Sodium 136  135 - 145 mEq/L   Potassium 3.9  3.5 - 5.1 mEq/L   Chloride 103  96 - 112 mEq/L   CO2 23  19 - 32 mEq/L   Glucose, Bld 96  70 - 99 mg/dL   BUN 18  6 - 23 mg/dL   Creatinine, Ser 2.95  0.50 - 1.35 mg/dL   Calcium 8.7  8.4 - 62.1 mg/dL   Total Protein 7.2  6.0 - 8.3 g/dL   Albumin 3.7  3.5 - 5.2 g/dL   AST 20  0 - 37 U/L   ALT 32  0 - 53 U/L   Alkaline Phosphatase 62  39 - 117 U/L    Total Bilirubin 0.4  0.3 - 1.2 mg/dL   GFR calc non Af Amer >90  >90 mL/min   GFR calc Af Amer >90  >90 mL/min  POCT I-STAT TROPONIN I     Status: None   Collection Time    04/26/13  3:36 PM      Result Value Range   Troponin i, poc 0.00  0.00 - 0.08 ng/mL   Comment 3            Dg Chest 2 View  04/26/2013   CLINICAL DATA:  Short of breath. Atrial fibrillation  EXAM: CHEST  2 VIEW  COMPARISON:  None.  FINDINGS: The heart size and mediastinal contours are within normal limits. Both lungs are clear. The visualized skeletal structures are unremarkable.  IMPRESSION: No active cardiopulmonary disease.   Electronically Signed   By: Marlan Palau M.D.   On: 04/26/2013 14:56     ASSESSMENT: 1. Recurrent symptomatic AF - onset < 12 hours    --CHADS2 = 1  2. Obesity 3. HTN  PLAN/DISCUSSION:  Given symptoms and onset < 12 hours will plan DC-CV in ER. Will increase flecainide to 150 bid and start Eliquis 5mg  bid (starter packet given). If has recurrent AF will need to consider alternate anti-arrhythmic vs repeat ablation.  Daniel Bensimhon,MD 5:08 PM

## 2013-04-26 NOTE — CV Procedure (Signed)
     DIRECT CURRENT CARDIOVERSION  NAME:  Mark Gonzalez.   MRN: [id number] DOB:  [birth year]   ADMIT DATE: 04/26/2013   INDICATIONS: Atrial fibrillation    PROCEDURE:   Informed consent was obtained prior to the procedure. The risks, benefits and alternatives for the procedure were discussed and the patient comprehended these risks. Once an appropriate time out was taken, the patient had the defibrillator pads placed in the anterior and posterior position. The patient then underwent sedation by the Dr. Anitra Lauth with 160mg  propofol. Once an appropriate level of sedation was achieved, the patient received a single biphasic, synchronized 200J shock with prompt conversion to sinus rhythm. No apparent complications.   CONLCUSION:   1.  Successful DC-CV of symptomatic AF  Daniel Bensimhon,MD 5:11 PM

## 2013-04-27 NOTE — ED Provider Notes (Signed)
Medical screening examination/treatment/procedure(s) were conducted as a shared visit with non-physician practitioner(s) and myself.  I personally evaluated the patient during the encounter Pt with recurrent a.fib on flecanide who is currently in a.fib.  Pt was sedated with propofol by myself and cardioverted by cardiology back to NSR.   Gwyneth Sprout, MD 04/27/13 1401

## 2013-04-29 ENCOUNTER — Telehealth (HOSPITAL_COMMUNITY): Payer: Self-pay | Admitting: *Deleted

## 2013-04-29 MED ORDER — FLECAINIDE ACETATE 100 MG PO TABS: 150.0000 mg | ORAL_TABLET | Freq: Two times a day (BID) | ORAL | Status: DC

## 2013-04-29 NOTE — Telephone Encounter (Signed)
Per Dr Gala Romney pt in ER fir for dccv, needs to increase flecainide to 150 mg bid pt aware, new rx sent in

## 2013-04-30 ENCOUNTER — Other Ambulatory Visit (HOSPITAL_COMMUNITY): Payer: Self-pay | Admitting: *Deleted

## 2013-04-30 MED ORDER — FLECAINIDE ACETATE 150 MG PO TABS: 150.0000 mg | ORAL_TABLET | Freq: Two times a day (BID) | ORAL | Status: DC

## 2013-05-09 ENCOUNTER — Other Ambulatory Visit: Payer: Self-pay | Admitting: Internal Medicine

## 2013-05-27 ENCOUNTER — Other Ambulatory Visit (HOSPITAL_COMMUNITY): Payer: Self-pay | Admitting: *Deleted

## 2013-05-27 MED ORDER — APIXABAN 5 MG PO TABS: 5.0000 mg | ORAL_TABLET | Freq: Two times a day (BID) | ORAL | Status: DC

## 2013-05-30 ENCOUNTER — Other Ambulatory Visit (HOSPITAL_COMMUNITY): Payer: Self-pay | Admitting: Adult Health

## 2013-05-30 ENCOUNTER — Telehealth (HOSPITAL_COMMUNITY): Payer: Self-pay | Admitting: *Deleted

## 2013-05-30 DIAGNOSIS — I4891 Unspecified atrial fibrillation: Secondary | ICD-10-CM

## 2013-05-30 NOTE — Telephone Encounter (Signed)
Pt called this AM and c/o being back in a-fib, Dr Gala Romney discussed w/Dr Allred and they agree to admit pt on Monday for Tikosyn, pt aware and agreeable, bed requested

## 2013-06-03 ENCOUNTER — Encounter (HOSPITAL_COMMUNITY): Payer: Self-pay | Admitting: *Deleted

## 2013-06-03 ENCOUNTER — Inpatient Hospital Stay (HOSPITAL_COMMUNITY)
Admission: AD | Admit: 2013-06-03 | Discharge: 2013-06-05 | DRG: 309 | Disposition: A | Discharge status: Home / Self Care | Payer: BC Managed Care – PPO | Source: Ambulatory Visit | Attending: Internal Medicine | Admitting: Internal Medicine

## 2013-06-03 DIAGNOSIS — I428 Other cardiomyopathies: Secondary | ICD-10-CM | POA: Diagnosis present

## 2013-06-03 DIAGNOSIS — I4892 Unspecified atrial flutter: Secondary | ICD-10-CM | POA: Diagnosis present

## 2013-06-03 DIAGNOSIS — Z8249 Family history of ischemic heart disease and other diseases of the circulatory system: Secondary | ICD-10-CM

## 2013-06-03 DIAGNOSIS — Z6841 Body Mass Index (BMI) 40.0 and over, adult: Secondary | ICD-10-CM

## 2013-06-03 DIAGNOSIS — L4050 Arthropathic psoriasis, unspecified: Secondary | ICD-10-CM | POA: Diagnosis present

## 2013-06-03 DIAGNOSIS — I10 Essential (primary) hypertension: Secondary | ICD-10-CM | POA: Diagnosis present

## 2013-06-03 DIAGNOSIS — I4891 Unspecified atrial fibrillation: Principal | ICD-10-CM

## 2013-06-03 DIAGNOSIS — Z7901 Long term (current) use of anticoagulants: Secondary | ICD-10-CM

## 2013-06-03 HISTORY — DX: Other cardiomyopathies: I42.8

## 2013-06-03 LAB — COMPREHENSIVE METABOLIC PANEL
ALT: 42 U/L (ref 0–53)
AST: 25 U/L (ref 0–37)
Albumin: 3.6 g/dL (ref 3.5–5.2)
Alkaline Phosphatase: 64 U/L (ref 39–117)
BUN: 25 mg/dL — ABNORMAL HIGH (ref 6–23)
CO2: 23 mEq/L (ref 19–32)
Chloride: 107 mEq/L (ref 96–112)
Creatinine, Ser: 0.75 mg/dL (ref 0.50–1.35)
GFR calc Af Amer: >90 mL/min (ref >90)
GFR calc non Af Amer: >90 mL/min (ref >90)
Glucose, Bld: 109 mg/dL — ABNORMAL HIGH (ref 70–99)
Potassium: 4.2 mEq/L (ref 3.5–5.1)
Sodium: 139 mEq/L (ref 135–145)
Total Bilirubin: 0.4 mg/dL (ref 0.3–1.2)
Total Protein: 7.3 g/dL (ref 6.0–8.3)

## 2013-06-03 LAB — MAGNESIUM: Magnesium: 1.9 mg/dL (ref 1.5–2.5)

## 2013-06-03 LAB — CBC
HCT: 43 % (ref 39.0–52.0)
Hemoglobin: 15.3 g/dL (ref 13.0–17.0)
MCHC: 35.6 g/dL (ref 30.0–36.0)
MCV: 90 fL (ref 78.0–100.0)
RDW: 14 % (ref 11.5–15.5)
WBC: 4.2 10*3/uL (ref 4.0–10.5)

## 2013-06-03 MED ORDER — ONDANSETRON HCL 4 MG/2ML IJ SOLN: 4.0000 mg | Freq: Four times a day (QID) | INTRAMUSCULAR | Status: DC | PRN

## 2013-06-03 MED ORDER — POTASSIUM CHLORIDE CRYS ER 20 MEQ PO TBCR
20.0000 meq | EXTENDED_RELEASE_TABLET | ORAL | Status: DC
  Administered 2013-06-05: 20 meq via ORAL
  Filled 2013-06-03: qty 1

## 2013-06-03 MED ORDER — IRBESARTAN 75 MG PO TABS: 75.0000 mg | ORAL_TABLET | Freq: Every day | ORAL | Status: DC

## 2013-06-03 MED ORDER — SODIUM CHLORIDE 0.9 % IJ SOLN: 3.0000 mL | INTRAMUSCULAR | Status: DC | PRN

## 2013-06-03 MED ORDER — IRBESARTAN 75 MG PO TABS
75.0000 mg | ORAL_TABLET | Freq: Every day | ORAL | Status: DC
  Administered 2013-06-03 – 2013-06-04 (×2): 75 mg via ORAL
  Filled 2013-06-03 (×3): qty 1

## 2013-06-03 MED ORDER — DOFETILIDE 500 MCG PO CAPS: 500.0000 ug | ORAL_CAPSULE | Freq: Two times a day (BID) | ORAL | Status: DC

## 2013-06-03 MED ORDER — SODIUM CHLORIDE 0.9 % IV SOLN
250.0000 mL | INTRAVENOUS | Status: DC | PRN
Start: 2013-06-03 — End: 2013-06-05
  Administered 2013-06-05: 19:00:00 via INTRAVENOUS

## 2013-06-03 MED ORDER — ONDANSETRON HCL 4 MG PO TABS: 4.0000 mg | ORAL_TABLET | Freq: Four times a day (QID) | ORAL | Status: DC | PRN

## 2013-06-03 MED ORDER — SIMVASTATIN 10 MG PO TABS
10.0000 mg | ORAL_TABLET | Freq: Every day | ORAL | Status: DC
  Administered 2013-06-03 – 2013-06-05 (×3): 10 mg via ORAL
  Filled 2013-06-03 (×3): qty 1

## 2013-06-03 MED ORDER — ENOXAPARIN SODIUM 40 MG/0.4ML ~~LOC~~ SOLN: 40.0000 mg | SUBCUTANEOUS | Status: DC

## 2013-06-03 MED ORDER — FUROSEMIDE 20 MG PO TABS: 20.0000 mg | ORAL_TABLET | ORAL | Status: DC

## 2013-06-03 MED ORDER — POTASSIUM CHLORIDE CRYS ER 20 MEQ PO TBCR: 20.0000 meq | EXTENDED_RELEASE_TABLET | ORAL | Status: DC

## 2013-06-03 MED ORDER — DOCUSATE SODIUM 100 MG PO CAPS
100.0000 mg | ORAL_CAPSULE | Freq: Two times a day (BID) | ORAL | Status: DC
  Administered 2013-06-03 – 2013-06-05 (×4): 100 mg via ORAL
  Filled 2013-06-03 (×6): qty 1

## 2013-06-03 MED ORDER — DILTIAZEM HCL ER COATED BEADS 180 MG PO CP24
180.0000 mg | ORAL_CAPSULE | Freq: Every day | ORAL | Status: DC
  Administered 2013-06-04 – 2013-06-05 (×2): 180 mg via ORAL
  Filled 2013-06-03 (×2): qty 1

## 2013-06-03 MED ORDER — OMEGA-3-ACID ETHYL ESTERS 1 G PO CAPS
1.0000 g | ORAL_CAPSULE | Freq: Two times a day (BID) | ORAL | Status: DC
  Administered 2013-06-03 – 2013-06-05 (×4): 1 g via ORAL
  Filled 2013-06-03 (×5): qty 1

## 2013-06-03 MED ORDER — DOFETILIDE 500 MCG PO CAPS
500.0000 ug | ORAL_CAPSULE | Freq: Two times a day (BID) | ORAL | Status: DC
  Filled 2013-06-03: qty 1

## 2013-06-03 MED ORDER — DILTIAZEM HCL 90 MG PO TABS: 240.0000 mg | ORAL_TABLET | Freq: Every day | ORAL | Status: DC

## 2013-06-03 MED ORDER — ACETAMINOPHEN 650 MG RE SUPP: 650.0000 mg | Freq: Four times a day (QID) | RECTAL | Status: DC | PRN

## 2013-06-03 MED ORDER — ENOXAPARIN SODIUM 30 MG/0.3ML ~~LOC~~ SOLN: 30.0000 mg | Freq: Two times a day (BID) | SUBCUTANEOUS | Status: DC

## 2013-06-03 MED ORDER — MAGNESIUM SULFATE IN D5W 10-5 MG/ML-% IV SOLN
1.0000 g | Freq: Once | INTRAVENOUS | Status: AC
  Administered 2013-06-03: 1 g via INTRAVENOUS
  Filled 2013-06-03: qty 100

## 2013-06-03 MED ORDER — DOFETILIDE 500 MCG PO CAPS
500.0000 ug | ORAL_CAPSULE | Freq: Two times a day (BID) | ORAL | Status: DC
  Administered 2013-06-03 – 2013-06-04 (×2): 500 ug via ORAL
  Filled 2013-06-03 (×4): qty 1

## 2013-06-03 MED ORDER — ACETAMINOPHEN 325 MG PO TABS: 650.0000 mg | ORAL_TABLET | Freq: Four times a day (QID) | ORAL | Status: DC | PRN

## 2013-06-03 MED ORDER — SENNA 8.6 MG PO TABS
1.0000 | ORAL_TABLET | Freq: Two times a day (BID) | ORAL | Status: DC
  Administered 2013-06-03 – 2013-06-05 (×4): 8.6 mg via ORAL
  Filled 2013-06-03 (×6): qty 1

## 2013-06-03 MED ORDER — FUROSEMIDE 20 MG PO TABS
20.0000 mg | ORAL_TABLET | ORAL | Status: DC
  Administered 2013-06-05: 20 mg via ORAL
  Filled 2013-06-03: qty 1

## 2013-06-03 MED ORDER — SODIUM CHLORIDE 0.9 % IJ SOLN
3.0000 mL | Freq: Two times a day (BID) | INTRAMUSCULAR | Status: DC
  Administered 2013-06-04: 3 mL via INTRAVENOUS

## 2013-06-03 MED ORDER — APIXABAN 5 MG PO TABS: 5.0000 mg | ORAL_TABLET | Freq: Two times a day (BID) | ORAL | Status: DC

## 2013-06-03 MED ORDER — DOFETILIDE 500 MCG PO CAPS
500.0000 ug | ORAL_CAPSULE | Freq: Two times a day (BID) | ORAL | Status: DC
  Filled 2013-06-03 (×2): qty 1

## 2013-06-03 MED ORDER — SODIUM CHLORIDE 0.9 % IJ SOLN
3.0000 mL | Freq: Two times a day (BID) | INTRAMUSCULAR | Status: DC
  Administered 2013-06-03 – 2013-06-04 (×2): 3 mL via INTRAVENOUS

## 2013-06-03 MED ORDER — FOLIC ACID 1 MG PO TABS
1.0000 mg | ORAL_TABLET | Freq: Every day | ORAL | Status: DC
  Administered 2013-06-03 – 2013-06-05 (×3): 1 mg via ORAL
  Filled 2013-06-03 (×4): qty 1

## 2013-06-03 MED ORDER — APIXABAN 5 MG PO TABS
5.0000 mg | ORAL_TABLET | Freq: Two times a day (BID) | ORAL | Status: DC
  Administered 2013-06-03 – 2013-06-05 (×4): 5 mg via ORAL
  Filled 2013-06-03 (×5): qty 1

## 2013-06-03 NOTE — Progress Notes (Signed)
Pharmacy Consult for Dofetilide (Tikosyn) Iniation  Admit Complaint: 52 y.o. male admitted 06/03/2013 with atrial fibrillation to be initiated on dofetilide.   Assessment:  Patient Exclusion Criteria: If any screening criteria checked as "Yes", then  patient  should NOT receive dofetilide until criteria item is corrected. If "Yes" please indicate correction plan.  YES  NO Patient  Exclusion Criteria Correction Plan  []  [x]  Baseline QTc interval is greater than or equal to 440 msec. IF above YES box checked dofetilide contraindicated unless patient has ICD; then may proceed if QTc 500-550 msec or with known ventricular conduction abnormalities may proceed with QTc 550-600 msec. QTc =     []  [x]  Magnesium level is less than 1.8 mEq/l : Last magnesium:  Lab Results  Component Value Date   MG 1.9 06/03/2013         []  [x]  Potassium level is less than 4 mEq/l : Last potassium:  Lab Results  Component Value Date   K 4.2 06/03/2013         []  [x]  Patient is known or suspected to have a digoxin level greater than 2 ng/ml: No results found for this basename: DIGOXIN      []  [x]  Creatinine clearance less than 20 ml/min (calculated using Cockcroft-Gault, actual body weight and serum creatinine): Estimated Creatinine Clearance: 184.1 ml/min (by C-G formula based on Cr of 0.75).    []  [x]  Patient has received drugs known to prolong the QT intervals within the last 48 hours(phenothiazines, tricyclics or tetracyclic antidepressants, erythromycin, H-1 antihistamines, cisapride, fluoroquinolones, azithromycin). Drugs not listed above may have an, as yet, undetected potential to prolong the QT interval, updated information on QT prolonging agents is available at this website:QT prolonging agents   []  [x]  Patient received a dose of hydrochlorothiazide (Oretic) alone or in any combination including triamterene (Dyazide, Maxzide) in the last 48 hours.   []  [x]  Patient received a medication known to  increase dofetilide plasma concentrations prior to initial dofetilide dose:    Trimethoprim (Primsol, Proloprim) in the last 36 hours   Verapamil (Calan, Verelan) in the last 36 hours or a sustained release dose in the last 72 hours   Megestrol (Megace) in the last 5 days    Cimetidine (Tagamet) in the last 6 hours   Ketoconazole (Nizoral) in the last 24 hours   Itraconazole (Sporanox) in the last 48 hours    Prochlorperazine (Compazine) in the last 36 hours    []  [x]  Patient is known to have a history of torsades de pointes; congenital or acquired long QT syndromes.   []  [x]  Patient has received a Class 1 antiarrhythmic with less than 2 half-lives since last dose. (Disopyramide, Quinidine, Procainamide, Lidocaine, Mexiletine, Flecainide, Propafenone)   []  [x]  Patient has received amiodarone therapy in the past 3 months or amiodarone level is greater than 0.3 ng/ml.    Patient has been appropriately anticoagulated with apixiban 5mg  BID.   Ordering provider was confirmed at [email or web address] if they are not listed on the [facility] Authorized Prescribers list.  Goal of Therapy:  Follow renal function, electrolytes, potential drug interactions, and dose adjustment. Provide education and 1 week supply at discharge.  Plan:  1.  Initiate dofetilide based on renal function: Select One Calculated CrCl  Dose q12h  [x]  > 60 ml/min 500 mcg  []  40-60 ml/min 250 mcg  []  20-40 ml/min 125 mcg   2. Follow up QTc after the first 5 doses, renal function, electrolytes (K & Mg) daily x  3     days, dose adjustment, success of initiation and facilitate 1 week discharge supply as     clinically indicated.  3. Initiate Tikosyn education video (Call [phone] and ask for video # [phone]).  4. Place Enrollment Form on the chart for discharge supply of dofetilide.   Marcelino Scot 3:48 PM 06/03/2013

## 2013-06-03 NOTE — Care Management Note (Unsigned)
    Page 1 of 1   06/04/2013     3:29:49 PM   CARE MANAGEMENT NOTE 06/04/2013  Patient:  Mark Gonzalez, Mark Gonzalez   Account Number:  [id number]  Date Initiated:  06/03/2013  Documentation initiated by:  GRAVES-BIGELOW,Nivea Wojdyla  Subjective/Objective Assessment:   Pt admitted for tikosyn load.     Action/Plan:   CM has benefits check in process and will make pt aware once completed.   Anticipated DC Date:  06/06/2013   Anticipated DC Plan:  HOME/SELF CARE      DC Planning Services  CM consult  Medication Assistance      Choice offered to / List presented to:             Status of service:  In process, will continue to follow Medicare Important Message given?   (If response is "NO", the following Medicare IM given date fields will be blank) Date Medicare IM given:   Date Additional Medicare IM given:    Discharge Disposition:    Per UR Regulation:  Reviewed for med. necessity/level of care/duration of stay  If discussed at Long Length of Stay Meetings, dates discussed:    Comments:  06-04-13 [address] [place], [place] [phone] Per [employer] [place] online, medication [name] is covered, on drug list, tier 3, $100.00 co insurance. CM did call [facility] and they have medication available. Pharmacy stated medication will be 50.00. CM will make pt aware. MD please write Rx for 7 day supply to be filled via main pharmacy and CM will assist.

## 2013-06-03 NOTE — H&P (Signed)
Advanced Heart Failure Admit    HPI: Mark Gonzalez is a very pleasant 52 y/o male with a history of psoriatic arthritis, obesity, hypertension, and atrial fibrillation. In 2009 he was admitted for Tikosyn load followed by successful DC-CV. He continued on  500 mcg Tikosyn bid but later went back into A fib. He was referred to [facility] for an ablation. He had AF ablation at [facility] and was maintained in NSR on flecainide but went back into A fib.  Over the last year he has required 2 cardioversions. (S/P succesful DC-CV 08/16/12 and  DC-CV  04/26/13).   Last week he went back into A fib. He was instructed to stop flecainide 05/30/13 and plan for admit today.  Over the weekend he noticed increased dyspnea with exertion and while playing his saxophone.     SH : Lives with his wife. Drinks 3-4 glasses of wine per week.   FH: Dad had MI at 21 with CABG.   Review of Systems:     Cardiac Review of Systems: {Y] = yes [ ]  = no  Chest Pain [    ]  Resting SOB [   ] Exertional SOB  [ Y ]  Orthopnea [  ]   Pedal Edema [   ]    Palpitations [  ] Syncope  [  ]   Presyncope [   ]  General Review of Systems: [Y] = yes [  ]=no Constitional: recent weight change [  ]; anorexia [  ]; fatigue [  ]; nausea [  ]; night sweats [  ]; fever [  ]; or chills [  ];                                                                                                                    Dental: poor dentition[  ]; Last Dentist visit:   Eye : blurred vision [  ]; diplopia [   ]; vision changes [  ];  Amaurosis fugax[  ]; Resp: cough [  ];  wheezing[  ];  hemoptysis[  ]; shortness of breath[  ]; paroxysmal nocturnal dyspnea[  ]; dyspnea on exertion[Y  ]; or orthopnea[  ];  GI:  gallstones[  ], vomiting[  ];  dysphagia[  ]; melena[  ];  hematochezia [  ]; heartburn[  ];   Hx of  Colonoscopy[  ]; GU: kidney stones [  ]; hematuria[  ];   dysuria [  ];  nocturia[  ];  history of     obstruction [  ];                 Skin: rash, swelling[  ];, hair  loss[  ];  peripheral edema[  ];  or itching[  ]; Musculosketetal: myalgias[  ];  joint swelling[  ];  joint erythema[  ];  joint pain[Y  ];  back pain[Y  ];  Heme/Lymph: bruising[  ];  bleeding[  ];  anemia[  ];  Neuro: TIA[  ];  headaches[  ];  stroke[  ];  vertigo[  ];  seizures[  ];   paresthesias[  ];  difficulty walking[  ];  Psych:depression[  ]; anxiety[  ];  Endocrine: diabetes[  ];  thyroid dysfunction[  ];  Other:  Past Medical History  Diagnosis Date  . Paroxysmal atrial fibrillation     a. s/p DCCV 09/2007, 11/2007, 04/2013 --> flecainide and chronic coumadin.  . Morbid obesity   . Hypertension   . Psoriatic arthritis   . Cellulitis   . Atrial flutter     a. s/p DCCV 08/16/2012.  Marland Kitchen NICM (nonischemic cardiomyopathy)     a. EF 45-50% in setting of AF in past; b. Neg MV;  c. 04/2010 Echo: EF 55-60%;  d. 11/2011 Cath: nl cors.    Medications Prior to Admission  Medication Sig Dispense Refill  . apixaban (ELIQUIS) 5 MG TABS tablet Take 1 tablet (5 mg total) by mouth 2 (two) times daily.  60 tablet  6  . aspirin 81 MG tablet Take 81 mg by mouth at bedtime.       . cetirizine (ZYRTEC) 10 MG tablet Take 10 mg by mouth as directed. TAKE 1 TABLET X 3 DAYS BEFORE THE REMACAID INJECTION      . cholecalciferol (VITAMIN D) 1000 UNITS tablet Take 1,000 Units by mouth every morning.       . clobetasol ointment (TEMOVATE) 0.05 % Apply 1 application topically every evening.      . diclofenac-misoprostol (ARTHROTEC 75) 75-0.2 MG per tablet Take 1 tablet by mouth 2 (two) times daily.        Marland Kitchen diltiazem (CARDIZEM CD) 180 MG 24 hr capsule Take 180 mg by mouth every morning.      . fish oil-omega-3 fatty acids 1000 MG capsule Take 1 g by mouth 2 (two) times daily.       . folic acid (FOLVITE) 1 MG tablet Take 1 mg by mouth every evening.       . furosemide (LASIX) 20 MG tablet Take 20 mg by mouth every Monday, Wednesday, and Friday. Takes in evenings      . inFLIXimab (REMICADE) 100 MG injection  Inject 100 mg into the vein every 30 (thirty) days.      Marland Kitchen lovastatin (MEVACOR) 20 MG tablet take 1 tablet by mouth at bedtime  30 tablet  7  . METHOTREXATE SODIUM IJ Inject 0.6 mLs as directed every 7 (seven) days. Thursdays in evenings      . Multiple Vitamin (MULTIVITAMIN) tablet Take 1 tablet by mouth every morning.       . niacin (NIASPAN) 500 MG CR tablet Take 500 mg by mouth every evening.      . potassium chloride SA (K-DUR,KLOR-CON) 20 MEQ tablet Take 20 mEq by mouth every Monday, Wednesday, and Friday. Takes in evenings      . Psyllium Husk POWD Take 1 Package by mouth 2 (two) times daily. For bowel regularity      . ranitidine (ZANTAC) 150 MG tablet Take 150 mg by mouth as directed. TAKE 1 TABLET X 3 DAYS BEFORE THE REMACAID INJECTION      . valsartan (DIOVAN) 80 MG tablet Take 80 mg by mouth every evening.         Allergies  Allergen Reactions  . Lisinopril Other (See Comments)    cough    History   Social History  . Marital Status: Married    Spouse Name: N/A    Number of  Children: N/A  . Years of Education: N/A   Occupational History  . Not on file.   Social History Main Topics  . Smoking status: Former Smoker    Types: Cigarettes    Quit date: 08/01/1990  . Smokeless tobacco: Not on file  . Alcohol Use: 1.8 - 2.4 oz/week    3-4 Glasses of wine per week  . Drug Use: No  . Sexual Activity: Not on file   Other Topics Concern  . Not on file   Social History Narrative   He is married to his wife for 20 years.  He has one child.  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      Family History  Problem Relation Age of Onset  . Heart disease Neg Hx     PHYSICAL EXAM: Filed Vitals:   06/03/13 1317  BP: 135/98  Pulse: 79  Temp: 98.1 F (36.7 C)  Resp: 19   General:  Well appearing. No respiratory difficulty Sitting in chair.  HEENT: normal Neck: supple. no JVD.  Carotids 2+ bilat; no bruits. No lymphadenopathy or thryomegaly appreciated. Cor: PMI nondisplaced. Irregular Regular rate & rhythm. No rubs, gallops or murmurs. Lungs: clear Abdomen: Obese, soft, nontender, nondistended. No hepatosplenomegaly. No bruits or masses. Good bowel sounds. Extremities: no cyanosis, clubbing, rash, edema Neuro: alert & oriented x 3, cranial nerves grossly intact. moves all 4 extremities w/o difficulty. Affect pleasant.  ECG: A fib QT-C  435   Results for orders placed during the hospital encounter of 06/03/13 (from the past 24 hour(s))  CBC     Status: Abnormal   Collection Time    06/03/13 12:02 PM      Result Value Range   WBC 4.2  4.0 - 10.5 K/uL   RBC 4.78  4.22 - 5.81 MIL/uL   Hemoglobin 15.3  13.0 - 17.0 g/dL   HCT 91.4  78.2 - 95.6 %   MCV 90.0  78.0 - 100.0 fL   MCH 32.0  26.0 - 34.0 pg   MCHC 35.6  30.0 - 36.0 g/dL   RDW 21.3  08.6 - 57.8 %   Platelets 146 (*) 150 - 400 K/uL  COMPREHENSIVE METABOLIC PANEL     Status: Abnormal   Collection Time    06/03/13 12:02 PM      Result Value Range   Sodium 139  135 - 145 mEq/L   Potassium 4.2  3.5 - 5.1 mEq/L   Chloride 107  96 - 112 mEq/L   CO2 23  19 - 32 mEq/L   Glucose, Bld 109 (*) 70 - 99 mg/dL   BUN 25 (*) 6 - 23 mg/dL   Creatinine, Ser 4.69  0.50 - 1.35 mg/dL   Calcium 8.8  8.4 - 62.9 mg/dL   Total Protein 7.3  6.0 - 8.3 g/dL   Albumin 3.6  3.5 - 5.2 g/dL   AST 25  0 - 37 U/L   ALT 42  0 - 53 U/L   Alkaline Phosphatase 64  39 - 117 U/L   Total Bilirubin 0.4  0.3 - 1.2 mg/dL   GFR calc non Af Amer >90  >90 mL/min   GFR calc Af Amer >90  >90 mL/min  MAGNESIUM     Status: None   Collection Time    06/03/13 12:02 PM      Result Value Range   Magnesium 1.9  1.5 - 2.5 mg/dL   No results found.   ASSESSMENT: 1. A fib- S/P multiple DC-CV.  2. Psoriatic Arthritis 3. Obesity 4. HTN    PLAN/DISCUSSION: Mr Szumski is a scheduled admit for tikosyn load due to recurrent A fib. Prior to admit  flecanide was stopped 10/30 and he been on eliquis 5 mg twice a day.   Will start Tikosyn 500 mcg bid based on creatinine clearance 300.8 and baseline QTc 435. Potassium and magnesium stable. Will start Tikosyn 500 mcg bid. Follow BMET and Magnesium daily. Will need daily EKGs. May need cardioversion if doesn't convert.   CLEGG,AMYNP-C 3:04 PM  Patient seen and examined with [name], NP. We discussed all aspects of the encounter. I agree with the assessment and plan as stated above.   Patient with PAF. S/p previous ablation. Now failed Flecainide. Will restart Tikosyn (was on prior to ablation and tolerated well). Will give 5 doses and if not in SR will plan DC-CV on Wed or Thurs. Continue NOAC. Agree with supping Mg. ECGs reviewed personally. Discussed with Dr. Johney Frame.    [name] 5:35 PM

## 2013-06-04 LAB — BASIC METABOLIC PANEL
Chloride: 106 mEq/L (ref 96–112)
GFR calc Af Amer: >90 mL/min (ref >90)
GFR calc non Af Amer: >90 mL/min (ref >90)
Glucose, Bld: 105 mg/dL — ABNORMAL HIGH (ref 70–99)
Potassium: 4 mEq/L (ref 3.5–5.1)
Sodium: 139 mEq/L (ref 135–145)

## 2013-06-04 MED ORDER — POTASSIUM CHLORIDE CRYS ER 20 MEQ PO TBCR
20.0000 meq | EXTENDED_RELEASE_TABLET | Freq: Once | ORAL | Status: AC
  Administered 2013-06-04: 20 meq via ORAL
  Filled 2013-06-04: qty 1

## 2013-06-04 MED ORDER — DOFETILIDE 500 MCG PO CAPS
500.0000 ug | ORAL_CAPSULE | Freq: Two times a day (BID) | ORAL | Status: DC
  Administered 2013-06-04 – 2013-06-05 (×2): 500 ug via ORAL
  Filled 2013-06-04: qty 1

## 2013-06-04 NOTE — Progress Notes (Signed)
Advanced Heart Failure Rounding Note   Subjective:   Mark Gonzalez is a very pleasant 52 y/o male with a history of psoriatic arthritis, obesity, hypertension, and atrial fibrillation. In 2009 he was admitted for Tikosyn load followed by successful DC-CV. He continued on 500 mcg Tikosyn bid but later went back into A fib. He was referred to [facility] for an ablation. He had AF ablation at [facility] and was maintained in NSR on flecainide but went back into A fib. Over the last year he has required 2 cardioversions. (S/P succesful DC-CV 08/16/12 and DC-CV 04/26/13).   Last week he went back into A fib. He was instructed to stop flecainide 05/30/13 and plan for admit today. Over the weekend he noticed increased dyspnea with exertion and while playing his saxophone.   Yesterday he was admitted and started on TIksoyn. Remains in A fib.   Denies SOB/PND/Orthopnea      Objective:   Weight Range:  Vital Signs:   Temp:  [97.9 F (36.6 C)-98.1 F (36.7 C)] 98 F (36.7 C) (11/03 2125) Pulse Rate:  [74-87] 87 (11/03 2125) Resp:  [18-19] 19 (11/03 1317) BP: (113-135)/(66-98) 116/98 mmHg (11/03 2125) SpO2:  [98 %-100 %] 100 % (11/03 2125) Weight:  [407 lb 6.4 oz (184.795 kg)] 407 lb 6.4 oz (184.795 kg) (11/03 1140) Last BM Date: 06/03/13  Weight change: Filed Weights   06/03/13 1140  Weight: 407 lb 6.4 oz (184.795 kg)    Intake/Output:   Intake/Output Summary (Last 24 hours) at 06/04/13 0953 Last data filed at 06/04/13 0548  Gross per 24 hour  Intake    480 ml  Output      0 ml  Net    480 ml     Physical Exam: General:  Well appearing. No resp difficulty HEENT: normal Neck: supple. JVP flat . Carotids 2+ bilat; no bruits. No lymphadenopathy or thryomegaly appreciated. Cor: PMI nondisplaced. Regular rate & rhythm. No rubs, gallops or murmurs. Lungs: clear Abdomen: Obese, soft, nontender, nondistended. No hepatosplenomegaly. No bruits or masses. Good bowel sounds. Extremities: no cyanosis,  clubbing, rash, edema Neuro: alert & orientedx3, cranial nerves grossly intact. moves all 4 extremities w/o difficulty. Affect pleasant  Telemetry: A fib 100s   Labs: Basic Metabolic Panel:  Recent Labs Lab 06/03/13 1202 06/04/13 0540  NA 139 139  K 4.2 4.0  CL 107 106  CO2 23 24  GLUCOSE 109* 105*  BUN 25* 17  CREATININE 0.75 0.71  CALCIUM 8.8 8.3*  MG 1.9 2.1    Liver Function Tests:  Recent Labs Lab 06/03/13 1202  AST 25  ALT 42  ALKPHOS 64  BILITOT 0.4  PROT 7.3  ALBUMIN 3.6   No results found for this basename: LIPASE, AMYLASE,  in the last 168 hours No results found for this basename: AMMONIA,  in the last 168 hours  CBC:  Recent Labs Lab 06/03/13 1202  WBC 4.2  HGB 15.3  HCT 43.0  MCV 90.0  PLT 146*    Cardiac Enzymes: No results found for this basename: CKTOTAL, CKMB, CKMBINDEX, TROPONINI,  in the last 168 hours  BNP: BNP (last 3 results) No results found for this basename: PROBNP,  in the last 8760 hours   Other results:  EKG: A fib   Imaging:  No results found.   Medications:     Scheduled Medications: . apixaban  5 mg Oral BID  . diltiazem  180 mg Oral Daily  . docusate sodium  100 mg Oral  BID  . dofetilide  500 mcg Oral Q12H  . folic acid  1 mg Oral Daily  . [START ON 06/05/2013] furosemide  20 mg Oral Q M,W,F  . irbesartan  75 mg Oral QHS  . omega-3 acid ethyl esters  1 g Oral BID  . [START ON 06/05/2013] potassium chloride  20 mEq Oral Q M,W,F  . senna  1 tablet Oral BID  . simvastatin  10 mg Oral q1800  . sodium chloride  3 mL Intravenous Q12H  . sodium chloride  3 mL Intravenous Q12H     Infusions:     PRN Medications:  sodium chloride, acetaminophen, acetaminophen, ondansetron (ZOFRAN) IV, ondansetron, sodium chloride   Assessment:   ASSESSMENT:  1. A fib- S/P multiple DC-CV.  2. Psoriatic Arthritis  3. Obesity  4. HTN      Plan/Discussion:    He has received 2 doses of  tikosyn but remains in  A fib. Continue eliqis 5 mg bid. Potassium and Magnesium ok. Continue to follow closely. Will need DC-CV if he doesn't convert after 5 doses of tikosyn.   Length of Stay: 1  CLEGG,AMY NP-C  06/04/2013, 9:53 AM Advanced Heart Failure Team Pager [phone] (M-F; 7a - 4p)  Please contact [facility] for night-coverage after hours (4p -7a ) and weekends on [email or web address]  Patient seen and examined with [name], NP. We discussed all aspects of the encounter. I agree with the assessment and plan as stated above.   Doing well with Tikosyn. QT ok. Electrolytes ok. Continue load.  Reuel Boom Admire Bunnell,MD 12:36 PM

## 2013-06-05 ENCOUNTER — Encounter (HOSPITAL_COMMUNITY): Payer: Self-pay | Admitting: Certified Registered"

## 2013-06-05 ENCOUNTER — Inpatient Hospital Stay (HOSPITAL_COMMUNITY): Payer: BC Managed Care – PPO | Admitting: Anesthesiology

## 2013-06-05 ENCOUNTER — Encounter (HOSPITAL_COMMUNITY): Payer: BC Managed Care – PPO | Admitting: Anesthesiology

## 2013-06-05 ENCOUNTER — Encounter (HOSPITAL_COMMUNITY)
Admission: AD | Disposition: A | Discharge status: Home / Self Care | Payer: Self-pay | Source: Ambulatory Visit | Attending: Internal Medicine

## 2013-06-05 DIAGNOSIS — I4891 Unspecified atrial fibrillation: Secondary | ICD-10-CM

## 2013-06-05 HISTORY — PX: CARDIOVERSION: SHX1299

## 2013-06-05 LAB — BASIC METABOLIC PANEL
BUN: 16 mg/dL (ref 6–23)
CO2: 21 mEq/L (ref 19–32)
Chloride: 108 mEq/L (ref 96–112)
GFR calc Af Amer: >90 mL/min (ref >90)
GFR calc non Af Amer: >90 mL/min (ref >90)
Glucose, Bld: 112 mg/dL — ABNORMAL HIGH (ref 70–99)
Potassium: 4.2 mEq/L (ref 3.5–5.1)
Sodium: 138 mEq/L (ref 135–145)

## 2013-06-05 SURGERY — CARDIOVERSION
Anesthesia: General | Wound class: Clean

## 2013-06-05 MED ORDER — SODIUM CHLORIDE 0.9 % IV SOLN: 250.0000 mL | INTRAVENOUS | Status: DC

## 2013-06-05 MED ORDER — DOFETILIDE 500 MCG PO CAPS
500.0000 ug | ORAL_CAPSULE | Freq: Two times a day (BID) | ORAL | Status: DC
  Filled 2013-06-05: qty 1

## 2013-06-05 MED ORDER — PROPOFOL 10 MG/ML IV BOLUS
INTRAVENOUS | Status: DC | PRN
  Administered 2013-06-05: 100 mg via INTRAVENOUS

## 2013-06-05 MED ORDER — LIDOCAINE HCL (CARDIAC) 20 MG/ML IV SOLN
INTRAVENOUS | Status: DC | PRN
  Administered 2013-06-05: 70 mg via INTRAVENOUS

## 2013-06-05 MED ORDER — SODIUM CHLORIDE 0.9 % IJ SOLN: 3.0000 mL | INTRAMUSCULAR | Status: DC | PRN

## 2013-06-05 MED ORDER — SODIUM CHLORIDE 0.9 % IJ SOLN: 3.0000 mL | Freq: Two times a day (BID) | INTRAMUSCULAR | Status: DC

## 2013-06-05 MED ORDER — DOFETILIDE 500 MCG PO CAPS: 500.0000 ug | ORAL_CAPSULE | Freq: Two times a day (BID) | ORAL | Status: DC

## 2013-06-05 MED ORDER — DOFETILIDE 500 MCG PO CAPS
500.0000 ug | ORAL_CAPSULE | Freq: Once | ORAL | Status: AC
  Administered 2013-06-05: 500 ug via ORAL
  Filled 2013-06-05: qty 1

## 2013-06-05 NOTE — Progress Notes (Signed)
Advanced Heart Failure Rounding Note   Subjective:   Mark Gonzalez is a very pleasant 52 y/o male with a history of psoriatic arthritis, obesity, hypertension, and atrial fibrillation. In 2009 he was admitted for Tikosyn load followed by successful DC-CV. He continued on 500 mcg Tikosyn bid but later went back into A fib. He was referred to [facility] for an ablation. He had AF ablation at [facility] and was maintained in NSR on flecainide but went back into A fib. Over the last year he has required 2 cardioversions. (S/P succesful DC-CV 08/16/12 and DC-CV 04/26/13).   Last week he went back into A fib. He was instructed to stop flecainide 05/30/13 and plan for admit today. Over the weekend he noticed increased dyspnea with exertion and while playing his saxophone.   Admitted and started on TIksoyn. Remains in A fib.   Wants to go home. Denies SOB/PND/Orthopnea      Objective:   Weight Range:  Vital Signs:   Temp:  [98 F (36.7 C)-98.6 F (37 C)] 98 F (36.7 C) (11/05 0527) Pulse Rate:  [90] 90 (11/05 0527) BP: (126-141)/(74-100) 126/74 mmHg (11/05 0527) SpO2:  [96 %-99 %] 99 % (11/05 0527) Last BM Date: 06/04/13  Weight change: Filed Weights   06/03/13 1140  Weight: 407 lb 6.4 oz (184.795 kg)    Intake/Output:   Intake/Output Summary (Last 24 hours) at 06/05/13 0847 Last data filed at 06/05/13 0839  Gross per 24 hour  Intake    360 ml  Output      0 ml  Net    360 ml     Physical Exam: General:  Well appearing. No resp difficulty Sitting in chair.  HEENT: normal Neck: supple. JVP flat . Carotids 2+ bilat; no bruits. No lymphadenopathy or thryomegaly appreciated. Cor: PMI nondisplaced. Regular rate & rhythm. No rubs, gallops or murmurs. Lungs: clear Abdomen: Obese, soft, nontender, nondistended. No hepatosplenomegaly. No bruits or masses. Good bowel sounds. Extremities: no cyanosis, clubbing, rash, edema Neuro: alert & orientedx3, cranial nerves grossly intact. moves all 4 extremities  w/o difficulty. Affect pleasant  Telemetry: A fib 100-120s   Labs: Basic Metabolic Panel:  Recent Labs Lab 06/03/13 1202 06/04/13 0540 06/05/13 0405  NA 139 139 138  K 4.2 4.0 4.2  CL 107 106 108  CO2 23 24 21   GLUCOSE 109* 105* 112*  BUN 25* 17 16  CREATININE 0.75 0.71 0.70  CALCIUM 8.8 8.3* 8.8  MG 1.9 2.1 2.2    Liver Function Tests:  Recent Labs Lab 06/03/13 1202  AST 25  ALT 42  ALKPHOS 64  BILITOT 0.4  PROT 7.3  ALBUMIN 3.6   No results found for this basename: LIPASE, AMYLASE,  in the last 168 hours No results found for this basename: AMMONIA,  in the last 168 hours  CBC:  Recent Labs Lab 06/03/13 1202  WBC 4.2  HGB 15.3  HCT 43.0  MCV 90.0  PLT 146*    Cardiac Enzymes: No results found for this basename: CKTOTAL, CKMB, CKMBINDEX, TROPONINI,  in the last 168 hours  BNP: BNP (last 3 results) No results found for this basename: PROBNP,  in the last 8760 hours   Other results:  EKG: A fib   Imaging: No results found.   Medications:     Scheduled Medications: . apixaban  5 mg Oral BID  . diltiazem  180 mg Oral Daily  . docusate sodium  100 mg Oral BID  . dofetilide  500 mcg  Oral Q12H  . folic acid  1 mg Oral Daily  . furosemide  20 mg Oral Q M,W,F  . irbesartan  75 mg Oral QHS  . omega-3 acid ethyl esters  1 g Oral BID  . potassium chloride  20 mEq Oral Q M,W,F  . senna  1 tablet Oral BID  . simvastatin  10 mg Oral q1800  . sodium chloride  3 mL Intravenous Q12H  . sodium chloride  3 mL Intravenous Q12H    Infusions:    PRN Medications: sodium chloride, acetaminophen, acetaminophen, ondansetron (ZOFRAN) IV, ondansetron, sodium chloride   Assessment:   ASSESSMENT:  1. A fib- S/P multiple DC-CV.  2. Psoriatic Arthritis  3. Obesity  4. HTN      Plan/Discussion:    He has received 4 doses of  tikosyn but remains in A fib. Continue eliqis 5 mg bid. Potassium and Magnesium ok.  Will need DC-CV. Cardioversion  scheduled for 6 pm today with possible d/c after. Make NPO  Length of Stay: 2  CLEGG,AMY NP-C  06/05/2013, 8:47 AM Advanced Heart Failure Team Pager [phone] (M-F; 7a - 4p)  Please contact [facility] for night-coverage after hours (4p -7a ) and weekends on [email or web address]   Patient seen and examined with [name], NP. We discussed all aspects of the encounter. I agree with the assessment and plan as stated above.   Tolerating Tikosyn well. QT and electrolytes ok.  Will plan DC-CV early evening and discharge him later tonight.   Dheeraj Hail,MD 11:23 AM

## 2013-06-05 NOTE — Anesthesia Preprocedure Evaluation (Addendum)
Anesthesia Evaluation  Patient identified by MRN, date of birth, ID band Patient awake    Reviewed: Allergy & Precautions, H&P , NPO status , Patient's Chart, lab work & pertinent test results  Airway Mallampati: I TM Distance: >3 FB Neck ROM: Full    Dental  (+) Teeth Intact and Dental Advisory Given   Pulmonary          Cardiovascular hypertension, Pt. on medications + dysrhythmias Atrial Fibrillation     Neuro/Psych    GI/Hepatic   Endo/Other  Morbid obesity  Renal/GU      Musculoskeletal   Abdominal   Peds  Hematology   Anesthesia Other Findings   Reproductive/Obstetrics                           Anesthesia Physical Anesthesia Plan  ASA: III  Anesthesia Plan: General   Post-op Pain Management:    Induction: Intravenous  Airway Management Planned: Mask  Additional Equipment:   Intra-op Plan:   Post-operative Plan: Extubation in OR  Informed Consent: I have reviewed the patients History and Physical, chart, labs and discussed the procedure including the risks, benefits and alternatives for the proposed anesthesia with the patient or authorized representative who has indicated his/her understanding and acceptance.   Dental advisory given  Plan Discussed with: CRNA, Anesthesiologist and Surgeon  Anesthesia Plan Comments:         Anesthesia Quick Evaluation

## 2013-06-05 NOTE — Transfer of Care (Signed)
Immediate Anesthesia Transfer of Care Note  Patient: Mark Gonzalez.  Procedure(s) Performed: Procedure(s): CARDIOVERSION (N/A)  Patient Location: PACU  Anesthesia Type:General  Level of Consciousness: awake  Airway & Oxygen Therapy: Patient Spontanous Breathing and Patient connected to nasal cannula oxygen  Post-op Assessment: Report given to PACU RN, Post -op Vital signs reviewed and stable and Patient moving all extremities  Post vital signs: Reviewed and stable  Complications: No apparent anesthesia complications

## 2013-06-05 NOTE — Discharge Summary (Signed)
Advanced Heart Failure Team  Discharge Summary   Patient ID: Mark Gonzalez. MRN: [id number], DOB/AGE: 53-Feb-1962 53 y.o. Admit date: 06/03/2013 D/C date:     06/06/2013   Primary Discharge Diagnoses:  1. A fib- S/P multiple DC-CV. Admitted to [facility] load.    Secondary Discharge Diagnoses:  1. Psoriatic Arthritis  2. Obesity  3. HTN    Hospital Course:   Mark Gonzalez is a very pleasant 52 y/o male with a history of psoriatic arthritis, obesity, hypertension, and atrial fibrillation. In 2009 he was admitted for Tikosyn load followed by successful DC-CV. He continued on 500 mcg Tikosyn bid but,  later went back into A fib. He was referred to [facility] for an ablation. He had AF ablation at [facility] and was maintained in NSR on flecainide but went back into A fib. Over the last year he has required 2 cardioversions that were initially successfully but he again went back into A fib. (S/P succesful DC-CV 08/16/12 and DC-CV 04/26/13). Last week he went back into A fib. He was instructed to stop flecainide 05/30/13 in anticipation of hospital admit.    [name] was a scheduled admit for Tiksoyn load due to persistent A-Fib with RVR. Prior to initiation of Tiksoyn labs and EKG was reviewed. Mark Gonzalez was started at 500 mcg bid. He remained on eliquis 5 mg bid.While receiving Tikosyn there was no evidence of pro arrhythmia or QT prolongation.  He failed to chemically convert after 5 doses of Tikosyn and at that point he required electrical cardioversion. He had successful cardioversion 06/06/13 and was later discharged to home in stable condition.    Prior to discharge he was given  7 days of Tikosyn 500 mcg bid.       Discharge Vitals: Blood pressure 112/78, pulse 67, temperature 98.1 F (36.7 C), temperature source Oral, resp. rate 18, height 6' (1.829 m), weight 407 lb 6.4 oz (184.795 kg), SpO2 100.00%.  Labs: Lab Results  Component Value Date   WBC 4.2 06/03/2013   HGB 15.3 06/03/2013   HCT 43.0  06/03/2013   MCV 90.0 06/03/2013   PLT 146* 06/03/2013    Recent Labs Lab 06/03/13 1202  06/05/13 0405  NA 139  < > 138  K 4.2  < > 4.2  CL 107  < > 108  CO2 23  < > 21  BUN 25*  < > 16  CREATININE 0.75  < > 0.70  CALCIUM 8.8  < > 8.8  PROT 7.3  --   --   BILITOT 0.4  --   --   ALKPHOS 64  --   --   ALT 42  --   --   AST 25  --   --   GLUCOSE 109*  < > 112*  < > = values in this interval not displayed. Lab Results  Component Value Date   CHOL 136 04/22/2010   HDL 34.10* 04/22/2010   LDLCALC 92 04/22/2010   TRIG 52.0 04/22/2010   BNP (last 3 results) No results found for this basename: PROBNP,  in the last 8760 hours  Diagnostic Studies/Procedures   No results found.  Discharge Medications     Medication List         apixaban 5 MG Tabs tablet  Commonly known as:  ELIQUIS  Take 1 tablet (5 mg total) by mouth 2 (two) times daily.     aspirin 81 MG tablet  Take 81 mg by mouth at bedtime.  cetirizine 10 MG tablet  Commonly known as:  ZYRTEC  Take 10 mg by mouth as directed. TAKE 1 TABLET X 3 DAYS BEFORE THE REMACAID INJECTION     cholecalciferol 1000 UNITS tablet  Commonly known as:  VITAMIN D  Take 1,000 Units by mouth every morning.     clobetasol ointment 0.05 %  Commonly known as:  TEMOVATE  Apply 1 application topically every evening.     diclofenac-misoprostol 75-0.2 MG per tablet  Commonly known as:  ARTHROTEC 75  Take 1 tablet by mouth 2 (two) times daily.     diltiazem 180 MG 24 hr capsule  Commonly known as:  CARDIZEM CD  Take 180 mg by mouth every morning.     dofetilide 500 MCG capsule  Commonly known as:  TIKOSYN  Take 1 capsule (500 mcg total) by mouth every 12 (twelve) hours.     fish oil-omega-3 fatty acids 1000 MG capsule  Take 1 g by mouth 2 (two) times daily.     folic acid 1 MG tablet  Commonly known as:  FOLVITE  Take 1 mg by mouth every evening.     furosemide 20 MG tablet  Commonly known as:  LASIX  Take 20 mg by mouth  every Monday, Wednesday, and Friday. Takes in evenings     inFLIXimab 100 MG injection  Commonly known as:  REMICADE  Inject 100 mg into the vein every 30 (thirty) days.     lovastatin 20 MG tablet  Commonly known as:  MEVACOR  take 1 tablet by mouth at bedtime     METHOTREXATE SODIUM IJ  Inject 0.6 mLs as directed every 7 (seven) days. Thursdays in evenings     multivitamin tablet  Take 1 tablet by mouth every morning.     niacin 500 MG CR tablet  Commonly known as:  NIASPAN  Take 500 mg by mouth every evening.     potassium chloride SA 20 MEQ tablet  Commonly known as:  K-DUR,KLOR-CON  Take 20 mEq by mouth every Monday, Wednesday, and Friday. Takes in evenings     Psyllium Husk Powd  Take 1 Package by mouth 2 (two) times daily. For bowel regularity     ranitidine 150 MG tablet  Commonly known as:  ZANTAC  Take 150 mg by mouth as directed. TAKE 1 TABLET X 3 DAYS BEFORE THE REMACAID INJECTION     valsartan 80 MG tablet  Commonly known as:  DIOVAN  Take 80 mg by mouth every evening.        Disposition   The patient will be discharged in stable condition to home. Discharge Orders   Future Appointments Provider Department Dept Phone   06/26/2013 12:20 PM [facility]  [facility] [phone]   Future Orders Complete By Expires   Contraindication to ACEI at discharge  As directed    Comments:     EF normal   Diet - low sodium heart healthy  As directed    Increase activity slowly  As directed      Follow-up Information   Follow up with Arvilla Meres, MD On 06/26/2013. (12:20 Garage Code 2000)    Specialty:  Cardiology   Contact information:   [address] [place] [address] [phone]         Duration of Discharge Encounter: Greater than 35 minutes   Signed, CLEGG,AMY NP-C 06/06/2013,  1900  Patient seen and examined with [name], NP.  We discussed all aspects of the encounter. I  agree with the assessment and plan as stated above. He underwent successful Tikosyn load and DC-CV. He is ready for discharge. Continue apixaban.   Daniel Bensimhon,MD 2:14 PM

## 2013-06-05 NOTE — Procedures (Signed)
     DIRECT CURRENT CARDIOVERSION  NAME:  Mark Gonzalez.   MRN: [id number] DOB:  03-Jan-1961   ADMIT DATE: 06/03/2013   INDICATIONS: Atrial fibrillation    PROCEDURE:   Patient has received 5 doses of Tikosyn. Anticoagulated with Apixaban, Informed consent was obtained prior to the procedure. The risks, benefits and alternatives for the procedure were discussed and the patient comprehended these risks. Once an appropriate time out was taken, the patient had the defibrillator pads placed in the anterior and posterior position. The patient then underwent sedation by the anesthesia service with 100mg  IV propofol. Once an appropriate level of sedation was achieved, the patient received a single biphasic, synchronized 200J shock with prompt conversion to sinus rhythm. No apparent complications.   CONLCUSION:   1.  Successful DC-CV.   [name] 7:19 PM

## 2013-06-05 NOTE — Preoperative (Signed)
Beta Blockers   Reason not to administer Beta Blockers:Not Applicable 

## 2013-06-05 NOTE — Progress Notes (Signed)
Pt. Discharged home with wife. Supply of tikosyn given. Patient has no further questions at this time.

## 2013-06-05 NOTE — Anesthesia Postprocedure Evaluation (Signed)
  Anesthesia Post-op Note  Patient: Mark Gonzalez.  Procedure(s) Performed: Procedure(s): CARDIOVERSION (N/A)  Patient Location: Nursing Unit  Anesthesia Type:General  Level of Consciousness: awake, alert  and oriented  Airway and Oxygen Therapy: Patient Spontanous Breathing and Patient connected to nasal cannula oxygen  Post-op Pain: none  Post-op Assessment: Post-op Vital signs reviewed, Patient's Cardiovascular Status Stable, Respiratory Function Stable, Patent Airway and No signs of Nausea or vomiting  Post-op Vital Signs: Reviewed and stable  Complications: No apparent anesthesia complications

## 2013-06-06 ENCOUNTER — Other Ambulatory Visit: Payer: Self-pay

## 2013-06-07 ENCOUNTER — Encounter (HOSPITAL_COMMUNITY): Payer: Self-pay | Admitting: Internal Medicine

## 2013-06-26 ENCOUNTER — Encounter (HOSPITAL_COMMUNITY): Payer: BC Managed Care – PPO

## 2013-07-03 ENCOUNTER — Ambulatory Visit (HOSPITAL_COMMUNITY)
Admission: RE | Admit: 2013-07-03 | Discharge: 2013-07-03 | Disposition: A | Discharge status: Home / Self Care | Payer: BC Managed Care – PPO | Source: Ambulatory Visit | Attending: Internal Medicine | Admitting: Internal Medicine

## 2013-07-03 VITALS — BP 136/88 | HR 66 | Wt >= 6400 oz

## 2013-07-03 DIAGNOSIS — I428 Other cardiomyopathies: Secondary | ICD-10-CM | POA: Insufficient documentation

## 2013-07-03 DIAGNOSIS — L4050 Arthropathic psoriasis, unspecified: Secondary | ICD-10-CM | POA: Insufficient documentation

## 2013-07-03 DIAGNOSIS — Z7982 Long term (current) use of aspirin: Secondary | ICD-10-CM | POA: Insufficient documentation

## 2013-07-03 DIAGNOSIS — I4892 Unspecified atrial flutter: Secondary | ICD-10-CM

## 2013-07-03 DIAGNOSIS — Z79899 Other long term (current) drug therapy: Secondary | ICD-10-CM | POA: Insufficient documentation

## 2013-07-03 DIAGNOSIS — I4891 Unspecified atrial fibrillation: Secondary | ICD-10-CM | POA: Insufficient documentation

## 2013-07-03 DIAGNOSIS — I10 Essential (primary) hypertension: Secondary | ICD-10-CM | POA: Insufficient documentation

## 2013-07-03 NOTE — Patient Instructions (Signed)
We will contact you in 3 months to schedule your next appointment.  

## 2013-07-03 NOTE — Progress Notes (Signed)
Patient ID: Mark Gonzalez., male   DOB: [birth year], 52 y.o.   MRN: [id number] HPI:  Mark Gonzalez is a very pleasant 52 y/o male with a history of psoriatic arthritis, obesity, hypertension, and atrial fibrillation. He is s/p AF ablation at [facility] in 2012.  He was maintained on flecainide (Previous on Tikosyn).  Coumadin was discontinued in March of 2013 as he remained in NSR.  He previously has a negative Myoview with an EF of 45-50%. His ejection fraction was thought to be mildly depressed due to presence of atrial fibrillation. Echo 9/11 EF 55-60%.  He presented in May 2013 with chest pain and LHC was scheduled as Dad had MI at 88 with CABG.   LHC showed normal coronary arteries.    08/16/12: afib and successful DC-CV 04/26/13: afib and successful DC-CV  Follow up: Was admitted to the hospital for Tikosyn load d/t persistent Afib with RVR. He failed to chemically convert after 5 doses and was cardioverted which was successful 06/06/13. He then went back into afib 06/20/13, however on 07/01/13 noticed HR regular again. Doing well. Denies palpitations, orthopnea, or CP. 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   ROS: All systems negative except as listed in HPI, PMH and Problem List.  Past Medical History  Diagnosis Date  . Paroxysmal atrial fibrillation     a. s/p DCCV 09/2007, 11/2007, 04/2013 --> flecainide and chronic coumadin.  . Morbid obesity   . Hypertension   . Psoriatic arthritis   . Cellulitis   . Atrial flutter     a. s/p DCCV 08/16/2012.  Marland Kitchen NICM (nonischemic cardiomyopathy)     a. EF 45-50% in setting of AF in past; b. Neg MV;  c. 04/2010 Echo: EF 55-60%;  d. 11/2011 Cath: nl cors.     Current Outpatient Prescriptions  Medication Sig Dispense Refill  . apixaban (ELIQUIS) 5 MG TABS tablet Take 1 tablet (5 mg total) by mouth 2 (two) times daily.  60 tablet  6  . aspirin 81 MG tablet Take 81 mg by mouth at bedtime.       . cetirizine (ZYRTEC) 10 MG tablet Take 10 mg by mouth  as directed. TAKE 1 TABLET X 3 DAYS BEFORE THE REMACAID INJECTION      . cholecalciferol (VITAMIN D) 1000 UNITS tablet Take 1,000 Units by mouth every morning.       . clobetasol ointment (TEMOVATE) 0.05 % Apply 1 application topically every evening.      . diclofenac-misoprostol (ARTHROTEC 75) 75-0.2 MG per tablet Take 1 tablet by mouth 2 (two) times daily.        Marland Kitchen diltiazem (CARDIZEM CD) 180 MG 24 hr capsule Take 180 mg by mouth every morning.      . dofetilide (TIKOSYN) 500 MCG capsule Take 1 capsule (500 mcg total) by mouth every 12 (twelve) hours.  60 capsule  6  . fish oil-omega-3 fatty acids 1000 MG capsule Take 1 g by mouth 2 (two) times daily.       . folic acid (FOLVITE) 1 MG tablet Take 1 mg by mouth every evening.       . furosemide (LASIX) 20 MG tablet Take 20 mg by mouth every Monday, Wednesday, and Friday. Takes in evenings      . inFLIXimab (REMICADE) 100 MG injection Inject 100 mg into the vein every 30 (thirty) days.      Marland Kitchen lovastatin (MEVACOR) 20 MG tablet take 1 tablet by mouth at  bedtime  30 tablet  7  . METHOTREXATE SODIUM IJ Inject 0.6 mLs as directed every 7 (seven) days. Thursdays in evenings      . Multiple Vitamin (MULTIVITAMIN) tablet Take 1 tablet by mouth every morning.       . niacin (NIASPAN) 500 MG CR tablet Take 500 mg by mouth every evening.      . potassium chloride SA (K-DUR,KLOR-CON) 20 MEQ tablet Take 20 mEq by mouth every Monday, Wednesday, and Friday. Takes in evenings      . Psyllium Husk POWD Take 1 Package by mouth 2 (two) times daily. For bowel regularity      . ranitidine (ZANTAC) 150 MG tablet Take 150 mg by mouth as directed. TAKE 1 TABLET X 3 DAYS BEFORE THE REMACAID INJECTION      . valsartan (DIOVAN) 80 MG tablet Take 80 mg by mouth every evening.       No current facility-administered medications for this encounter.    Filed Vitals:   07/03/13 0912  BP: 136/88  Pulse: 66  Weight: 424 lb 8 oz (192.552 kg)  SpO2: 98%   PHYSICAL  EXAM: General:   WDWN, no acute distress. HEENT:  Normal. NECK:  Supple and thick.  No obvious JVD.  Carotids are 2+ bilaterally without any bruits.  There is no lymphadenopathy or thyromegaly. CARDIAC:  PMI is not palpable.  Regular tachycardic.  No obvious murmurs. LUNGS:  Clear. ABDOMEN:  Morbidly Obese, nontender, and nondistended.   EXTREMITIES:  Warm with no cyanosis, clubbing. trace edema.  +psoriatic plaques  Good pulses. NEURO:  Alert and oriented x3.  Cranial nerves II through XII are intact.  He moves all fours without difficulty.  Affect is pleasant.   ECG:  SB 57 bpm, QTc 420 ms  ASSESSMENT & PLAN:  1) Atrial Fibrillation: s/p AF ablation 2012. -Now on Tikosysn s/p DC-CV 06/06/13, however patient reports going back in afib 06/2013 and then back into normal rhythm 07/01/13. EKG today SB, QTc 420 msec. - Will continue tikosyn currently, but refer back to Dr. Deno Lunger at [facility] for consideration of repeat ablation. 2) Obesity - Morbidly obese, encouraged the patient to try and be as active as possible and watch his portion control.  [name] NP-C 9:50 AM  Patient seen and examined with Ulla Potash, NP. We discussed all aspects of the encounter. I agree with the assessment and plan as stated above.   He is back in NSR on Tikosyn.  Will continue. He has an appt in January to see Dr. Deno Lunger to discuss repeat ablation. I think this is good timing. If no further AF between now and then I would sit tight. If AF is in/out then repeat ablation probably warranted. If AF is frequent could consider switch to amio or dronedarone as needed. Continues on Eliquis.  Keiden Deskin,MD 9:58 AM

## 2013-07-03 NOTE — Addendum Note (Signed)
Encounter addended by: Noralee Space, RN on: 07/03/2013 10:06 AM<BR>     Documentation filed: Patient Instructions Section

## 2013-07-04 NOTE — Addendum Note (Signed)
Encounter addended by: Deitra Mayo, CCT on: 07/04/2013  9:24 AM<BR>     Documentation filed: Charges VN

## 2013-07-10 ENCOUNTER — Other Ambulatory Visit (HOSPITAL_COMMUNITY): Payer: Self-pay | Admitting: *Deleted

## 2013-07-10 MED ORDER — DOFETILIDE 500 MCG PO CAPS: 500.0000 ug | ORAL_CAPSULE | Freq: Two times a day (BID) | ORAL | Status: DC

## 2013-08-06 ENCOUNTER — Other Ambulatory Visit (HOSPITAL_COMMUNITY): Payer: Self-pay

## 2013-08-06 MED ORDER — DILTIAZEM HCL ER COATED BEADS 180 MG PO CP24: 180.0000 mg | ORAL_CAPSULE | Freq: Every morning | ORAL | Status: DC

## 2013-08-18 ENCOUNTER — Other Ambulatory Visit: Payer: Self-pay | Admitting: Internal Medicine

## 2013-08-25 ENCOUNTER — Other Ambulatory Visit: Payer: Self-pay | Admitting: Internal Medicine

## 2013-09-06 ENCOUNTER — Emergency Department (HOSPITAL_COMMUNITY)
Admission: EM | Admit: 2013-09-06 | Discharge: 2013-09-06 | Disposition: A | Discharge status: Home / Self Care | Payer: BC Managed Care – PPO | Attending: Emergency Medicine | Admitting: Emergency Medicine

## 2013-09-06 ENCOUNTER — Emergency Department (HOSPITAL_COMMUNITY): Payer: BC Managed Care – PPO

## 2013-09-06 ENCOUNTER — Encounter (HOSPITAL_COMMUNITY): Payer: Self-pay | Admitting: Emergency Medicine

## 2013-09-06 DIAGNOSIS — I4892 Unspecified atrial flutter: Secondary | ICD-10-CM | POA: Insufficient documentation

## 2013-09-06 DIAGNOSIS — Z872 Personal history of diseases of the skin and subcutaneous tissue: Secondary | ICD-10-CM | POA: Insufficient documentation

## 2013-09-06 DIAGNOSIS — R0789 Other chest pain: Secondary | ICD-10-CM

## 2013-09-06 DIAGNOSIS — I4891 Unspecified atrial fibrillation: Secondary | ICD-10-CM | POA: Insufficient documentation

## 2013-09-06 DIAGNOSIS — Z7902 Long term (current) use of antithrombotics/antiplatelets: Secondary | ICD-10-CM | POA: Insufficient documentation

## 2013-09-06 DIAGNOSIS — Z87891 Personal history of nicotine dependence: Secondary | ICD-10-CM | POA: Insufficient documentation

## 2013-09-06 DIAGNOSIS — Z79899 Other long term (current) drug therapy: Secondary | ICD-10-CM | POA: Insufficient documentation

## 2013-09-06 DIAGNOSIS — R071 Chest pain on breathing: Secondary | ICD-10-CM | POA: Insufficient documentation

## 2013-09-06 DIAGNOSIS — Z7982 Long term (current) use of aspirin: Secondary | ICD-10-CM | POA: Insufficient documentation

## 2013-09-06 DIAGNOSIS — I10 Essential (primary) hypertension: Secondary | ICD-10-CM | POA: Insufficient documentation

## 2013-09-06 DIAGNOSIS — M62838 Other muscle spasm: Secondary | ICD-10-CM | POA: Insufficient documentation

## 2013-09-06 LAB — CBC WITH DIFFERENTIAL/PLATELET
Basophils Absolute: 0 10*3/uL (ref 0.0–0.1)
Basophils Relative: 0 % (ref 0–1)
Eosinophils Absolute: 0.1 10*3/uL (ref 0.0–0.7)
Eosinophils Relative: 2 % (ref 0–5)
HCT: 40.3 % (ref 39.0–52.0)
Hemoglobin: 14.2 g/dL (ref 13.0–17.0)
Lymphocytes Relative: 22 % (ref 12–46)
Lymphs Abs: 1.1 10*3/uL (ref 0.7–4.0)
MCH: 31.6 pg (ref 26.0–34.0)
MCHC: 35.2 g/dL (ref 30.0–36.0)
MCV: 89.6 fL (ref 78.0–100.0)
MONOS PCT: 8 % (ref 3–12)
Monocytes Absolute: 0.4 10*3/uL (ref 0.1–1.0)
NEUTROS PCT: 69 % (ref 43–77)
Neutro Abs: 3.4 10*3/uL (ref 1.7–7.7)
Platelets: 151 10*3/uL (ref 150–400)
RBC: 4.5 MIL/uL (ref 4.22–5.81)
RDW: 13.2 % (ref 11.5–15.5)
WBC: 5 10*3/uL (ref 4.0–10.5)

## 2013-09-06 LAB — COMPREHENSIVE METABOLIC PANEL
ALT: 32 U/L (ref 0–53)
AST: 22 U/L (ref 0–37)
Albumin: 3.6 g/dL (ref 3.5–5.2)
Alkaline Phosphatase: 64 U/L (ref 39–117)
BILIRUBIN TOTAL: 0.4 mg/dL (ref 0.3–1.2)
BUN: 22 mg/dL (ref 6–23)
CO2: 20 mEq/L (ref 19–32)
Calcium: 8.9 mg/dL (ref 8.4–10.5)
Chloride: 103 mEq/L (ref 96–112)
Creatinine, Ser: 0.63 mg/dL (ref 0.50–1.35)
GFR calc non Af Amer: >90 mL/min (ref >90)
GLUCOSE: 114 mg/dL — AB (ref 70–99)
Potassium: 4.4 mEq/L (ref 3.7–5.3)
Sodium: 137 mEq/L (ref 137–147)
TOTAL PROTEIN: 7.7 g/dL (ref 6.0–8.3)

## 2013-09-06 LAB — TROPONIN I: Troponin I: <0.3 ng/mL (ref <0.30)

## 2013-09-06 LAB — LIPASE, BLOOD: LIPASE: 19 U/L (ref 11–59)

## 2013-09-06 MED ORDER — METHOCARBAMOL 750 MG PO TABS: 750.0000 mg | ORAL_TABLET | Freq: Four times a day (QID) | ORAL | Status: DC

## 2013-09-06 MED ORDER — METHOCARBAMOL 500 MG PO TABS
1000.0000 mg | ORAL_TABLET | Freq: Once | ORAL | Status: AC
  Administered 2013-09-06: 1000 mg via ORAL
  Filled 2013-09-06: qty 2

## 2013-09-06 MED ORDER — OXYCODONE-ACETAMINOPHEN 5-325 MG PO TABS: 2.0000 | ORAL_TABLET | ORAL | Status: DC | PRN

## 2013-09-06 MED ORDER — MORPHINE SULFATE 4 MG/ML IJ SOLN
4.0000 mg | Freq: Once | INTRAMUSCULAR | Status: AC
  Administered 2013-09-06: 4 mg via INTRAVENOUS
  Filled 2013-09-06: qty 1

## 2013-09-06 NOTE — ED Notes (Signed)
Dr. Norlene Campbell at bedside. Phlebotomy at bedside.

## 2013-09-06 NOTE — ED Notes (Signed)
Portable chest xray at bedside.

## 2013-09-06 NOTE — ED Notes (Signed)
Family at bedside. 

## 2013-09-06 NOTE — Discharge Instructions (Signed)
Please take medications as prescribed.  Use warm moist heat on areas of pain.  Follow up with your doctor for recheck in 3-5 days.  Return to the ER for worsening condition or new concerning symptoms.   Chest Wall Pain Chest wall pain is pain in or around the bones and muscles of your chest. It may take up to 6 weeks to get better. It may take longer if you must stay physically active in your work and activities.  CAUSES  Chest wall pain may happen on its own. However, it may be caused by:  A viral illness like the flu.  Injury.  Coughing.  Exercise.  Arthritis.  Fibromyalgia.  Shingles. HOME CARE INSTRUCTIONS   Avoid overtiring physical activity. Try not to strain or perform activities that cause pain. This includes any activities using your chest or your abdominal and side muscles, especially if heavy weights are used.  Put ice on the sore area.  Put ice in a plastic bag.  Place a towel between your skin and the bag.  Leave the ice on for 15-20 minutes per hour while awake for the first 2 days.  Only take over-the-counter or prescription medicines for pain, discomfort, or fever as directed by your caregiver. SEEK IMMEDIATE MEDICAL CARE IF:   Your pain increases, or you are very uncomfortable.  You have a fever.  Your chest pain becomes worse.  You have new, unexplained symptoms.  You have nausea or vomiting.  You feel sweaty or lightheaded.  You have a cough with phlegm (sputum), or you cough up blood. MAKE SURE YOU:   Understand these instructions.  Will watch your condition.  Will get help right away if you are not doing well or get worse. Document Released: 07/18/2005 Document Revised: 10/10/2011 Document Reviewed: 03/14/2011 [facility] Patient Information 2014 [place], [place].  Heat Therapy Heat therapy can help ease achy, tense, stiff, and tight muscles and joints. Heat should not be used on new injuries. Wait at least 48 hours after the injury before  using heat therapy. Heat also should not be used for discomfort or pain that occurs right after doing an activity. If you still have pain or stiffness 3 hours after finishing the activity, then heat therapy may be used. PRECAUTIONS  High heat or prolonged exposure to heat can cause burns. Be careful when using heat therapy to avoid burning your skin. If you have any of the following conditions, do not use heat until you have discussed heat therapy with your caregiver:  Poor circulation.  Healing wounds or scarred skin in the area being treated.  Diabetes, heart disease, or high blood pressure.  Numbness of the area being treated.  Unusual swelling of the area being treated.  Active infections.  Blood clots.  Cancer.  Inability to communicate your response to pain. This can include young children and people with dementia. HOME CARE INSTRUCTIONS Moist heat pack  Soak a clean towel in warm water, and squeeze out the extra water. The water temperature should be comfortable to the skin.  Put the warm, wet towel in a plastic bag.  Place a thin, dry towel between your skin and the bag.  Put the heat pack on the area for 5 minutes, and check your skin. Your skin may be pink, but it should not be red.  Leave the heat pack on the area for a total of 15 to 30 minutes.  Repeat this every 2 to 4 hours while awake. Do not use heat  while you are sleeping. Warm water bath  Fill a tub with warm water. The water temperature should be comfortable to the skin.  Place the affected body part in the tub.  Soak the area for 20 to 40 minutes.  Repeat as needed. Hot water bottle  Fill the water bottle half full with hot water.  Press out the extra air. Close the cap tightly.  Place a dry towel between your skin and the bottle.  Put the bottle on the area for 5 minutes, and check your skin. Your skin may be pink, but it should not be red.  Leave the bottle on the area for a total of 15 to  30 minutes.  Repeat this every 2 to 4 hours while awake. Electric heating pad  Place a dry towel between your skin and the heating pad.  Set the heating pad on low heat.  Put the heating pad on the area for 10 minutes, and check your skin. Your skin may be pink, but it should not be red.  Leave the heating pad on the area for a total of 20 to 40 minutes.  Repeat this every 2 to 4 hours while awake.  Do not lie on the heating pad.  Do not fall asleep while using the heating pad.  Do not use the heating pad near water. Contact with water can result in an electrical shock. SEEK MEDICAL CARE IF:  You have blisters, redness, swelling, or numbness.  You have any new problems.  Your problems are getting worse.  You have any questions or concerns. If you develop any problems, stop using heat therapy until you see your caregiver. MAKE SURE YOU:  Understand these instructions.  Will watch your condition.  Will get help right away if you are not doing well or get worse. Document Released: 10/10/2011 Document Reviewed: 10/10/2011 [facility] Patient Information 2014 [place].  Muscle Cramps and Spasms Muscle cramps and spasms occur when a muscle or muscles tighten and you have no control over this tightening (involuntary muscle contraction). They are a common problem and can develop in any muscle. The most common place is in the calf muscles of the leg. Both muscle cramps and muscle spasms are involuntary muscle contractions, but they also have differences:   Muscle cramps are sporadic and painful. They may last a few seconds to a quarter of an hour. Muscle cramps are often more forceful and last longer than muscle spasms.  Muscle spasms may or may not be painful. They may also last just a few seconds or much longer. CAUSES  It is uncommon for cramps or spasms to be due to a serious underlying problem. In many cases, the cause of cramps or spasms is unknown. Some common causes  are:   Overexertion.   Overuse from repetitive motions (doing the same thing over and over).   Remaining in a certain position for a long period of time.   Improper preparation, form, or technique while performing a sport or activity.   Dehydration.   Injury.   Side effects of some medicines.   Abnormally low levels of the salts and ions in your blood (electrolytes), especially potassium and calcium. This could happen if you are taking water pills (diuretics) or you are pregnant.  Some underlying medical problems can make it more likely to develop cramps or spasms. These include, but are not limited to:   Diabetes.   Parkinson disease.   Hormone disorders, such as thyroid problems.  Alcohol abuse.   Diseases specific to muscles, joints, and bones.   Blood vessel disease where not enough blood is getting to the muscles.  HOME CARE INSTRUCTIONS   Stay well hydrated. Drink enough water and fluids to keep your urine clear or pale yellow.  It may be helpful to massage, stretch, and relax the affected muscle.  For tight or tense muscles, use a warm towel, heating pad, or hot shower water directed to the affected area.  If you are sore or have pain after a cramp or spasm, applying ice to the affected area may relieve discomfort.  Put ice in a plastic bag.  Place a towel between your skin and the bag.  Leave the ice on for 15-20 minutes, 03-04 times a day.  Medicines used to treat a known cause of cramps or spasms may help reduce their frequency or severity. Only take over-the-counter or prescription medicines as directed by your caregiver. SEEK MEDICAL CARE IF:  Your cramps or spasms get more severe, more frequent, or do not improve over time.  MAKE SURE YOU:   Understand these instructions.  Will watch your condition.  Will get help right away if you are not doing well or get worse. Document Released: 01/07/2002 Document Revised: 11/12/2012 Document  Reviewed: 07/04/2012 [facility] Patient Information 2014 [place], [place].

## 2013-09-06 NOTE — ED Notes (Signed)
PER EMS: pt from home, reports pulled muscle last week in chest area but woke up this morning with generalized chest pain bilaterally radiating to back. Denies N/V/D/abd pain. Pt reports SOB and increased pain with inspiration, O2-99% RA. Hx of HTN, BP-190/110, HR-78, hx of a-fib, EKG - sinus rhythm per EMS. Pt also avid saxophone player. Lungs sounds clear and audible bilaterally. A&Ox4.

## 2013-09-06 NOTE — ED Notes (Signed)
Pt reports he took 4 baby aspirin PTA.

## 2013-09-06 NOTE — ED Provider Notes (Signed)
CSN: [id number]     Arrival date & time 09/06/13  0512 History   First MD Initiated Contact with Patient 09/06/13 [phone]     Chief Complaint  Patient presents with  . Chest Pain   (Consider location/radiation/quality/duration/timing/severity/associated sxs/prior Treatment) HPI 53 year old male presents emergency part via EMS with complaint of chest pain.  Patient reports he has had right sided chest pain since Saturday.  Patient woke with the pain.  Thought he pulled a muscle in his chest wall.  Right chest wall pain had been getting better  Patient woke this morning with severe generalized chest pain bilaterally radiating towards his back, now worse on the left under breast and axilla.  Pain comes in waves, tightness, sharp, cramping/spasms.  No nausea, vomiting, abdominal pain.  No fever, no cough. He reports shortness of breath due to to pain upon inspiration.  No trauma, no cough, no fever.  Patient has history of paroxysmal A. fib, hypertension, nonischemic cardiomyopathy, with an EF of 45-50%.  Patient is on eliquis for paroxysmal A. fib.  He has h/o psoriasis.  He is a Location manager. Past Medical History  Diagnosis Date  . Paroxysmal atrial fibrillation     a. s/p DCCV 09/2007, 11/2007, 04/2013 --> flecainide and chronic coumadin.  . Morbid obesity   . Hypertension   . Psoriatic arthritis   . Cellulitis   . Atrial flutter     a. s/p DCCV 08/16/2012.  Marland Kitchen NICM (nonischemic cardiomyopathy)     a. EF 45-50% in setting of AF in past; b. Neg MV;  c. 04/2010 Echo: EF 55-60%;  d. 11/2011 Cath: nl cors.   Past Surgical History  Procedure Laterality Date  . Ablation    . Cardioversion    . Appendectomy    . Foot surgery    . Cardioversion  08/16/2012    Procedure: CARDIOVERSION;  Surgeon: Dolores Patty, MD;  Location: [facility] ENDOSCOPY;  Service: Cardiovascular;  Laterality: N/A;  . Cardioversion N/A 06/05/2013    Procedure: CARDIOVERSION;  Surgeon: Dolores Patty, MD;  Location: [facility] OR;   Service: Cardiovascular;  Laterality: N/A;   Family History  Problem Relation Age of Onset  . Heart disease Neg Hx    History  Substance Use Topics  . Smoking status: Former Smoker    Types: Cigarettes    Quit date: 08/01/1990  . Smokeless tobacco: Not on file  . Alcohol Use: 1.8 - 2.4 oz/week    3-4 Glasses of wine per week    Review of Systems  See History of Present Illness; otherwise all other systems are reviewed and negative Allergies  Lisinopril  Home Medications   Current Outpatient Rx  Name  Route  Sig  Dispense  Refill  . apixaban (ELIQUIS) 5 MG TABS tablet   Oral   Take 1 tablet (5 mg total) by mouth 2 (two) times daily.   60 tablet   6   . aspirin 81 MG tablet   Oral   Take 81 mg by mouth at bedtime.          . cetirizine (ZYRTEC) 10 MG tablet   Oral   Take 10 mg by mouth as directed. TAKE 1 TABLET X 3 DAYS BEFORE THE REMACAID INJECTION         . cholecalciferol (VITAMIN D) 1000 UNITS tablet   Oral   Take 1,000 Units by mouth every morning.          . clobetasol ointment (TEMOVATE) 0.05 %  Topical   Apply 1 application topically every evening.         . diclofenac-misoprostol (ARTHROTEC 75) 75-0.2 MG per tablet   Oral   Take 1 tablet by mouth 2 (two) times daily.           Marland Kitchen diltiazem (CARDIZEM CD) 180 MG 24 hr capsule   Oral   Take 1 capsule (180 mg total) by mouth every morning.   30 capsule   6   . dofetilide (TIKOSYN) 500 MCG capsule   Oral   Take 1 capsule (500 mcg total) by mouth every 12 (twelve) hours.   60 capsule   6   . fish oil-omega-3 fatty acids 1000 MG capsule   Oral   Take 1 g by mouth 2 (two) times daily.          . folic acid (FOLVITE) 1 MG tablet   Oral   Take 1 mg by mouth every evening.          . furosemide (LASIX) 20 MG tablet   Oral   Take 20 mg by mouth every Monday, Wednesday, and Friday. Takes in evenings         . inFLIXimab (REMICADE) 100 MG injection   Intravenous   Inject 100 mg  into the vein every 30 (thirty) days.         Marland Kitchen lovastatin (MEVACOR) 20 MG tablet      take 1 tablet by mouth at bedtime   30 tablet   7   . METHOTREXATE SODIUM IJ   Injection   Inject 0.6 mLs as directed every 7 (seven) days. Thursdays in evenings         . Multiple Vitamin (MULTIVITAMIN) tablet   Oral   Take 1 tablet by mouth every morning.          . niacin (NIASPAN) 500 MG CR tablet   Oral   Take 500 mg by mouth every evening.         . potassium chloride SA (K-DUR,KLOR-CON) 20 MEQ tablet      take 1 tablet by mouth EVERY MONDAY, WEDNESDAY AND FRIDAY.   15 tablet   12   . Psyllium Husk POWD   Oral   Take 1 Package by mouth 2 (two) times daily. For bowel regularity         . ranitidine (ZANTAC) 150 MG tablet   Oral   Take 150 mg by mouth as directed. TAKE 1 TABLET X 3 DAYS BEFORE THE REMACAID INJECTION         . valsartan (DIOVAN) 80 MG tablet      take 1 tablet by mouth once daily   30 tablet   6    BP 135/107  Temp(Src) 99.4 F (37.4 C) (Oral)  Resp 26  SpO2 95% Physical Exam  Nursing note and vitals reviewed. Constitutional: He is oriented to person, place, and time. He appears well-developed and well-nourished. He appears distressed.  Morbidly obese  HENT:  Head: Normocephalic and atraumatic.  Right Ear: External ear normal.  Left Ear: External ear normal.  Nose: Nose normal.  Mouth/Throat: Oropharynx is clear and moist.  Eyes: Conjunctivae and EOM are normal. Pupils are equal, round, and reactive to light.  Neck: Normal range of motion. Neck supple. No JVD present. No tracheal deviation present. No thyromegaly present.  Cardiovascular: Normal rate, regular rhythm, normal heart sounds and intact distal pulses.  Exam reveals no gallop and no friction rub.   No  murmur heard. Pulmonary/Chest: Effort normal. No stridor. No respiratory distress. He has no wheezes. He has no rales. He exhibits no tenderness.  Patient is hyperventilating  slightly, taking short breaths, splinting  Abdominal: Soft. Bowel sounds are normal. He exhibits no distension and no mass. There is no tenderness. There is no rebound and no guarding.  Musculoskeletal: Normal range of motion. He exhibits no edema and no tenderness.  Lymphadenopathy:    He has no cervical adenopathy.  Neurological: He is alert and oriented to person, place, and time. He exhibits normal muscle tone. Coordination normal.  Skin: Skin is warm and dry. No rash noted. No erythema. No pallor.  Psychiatric: He has a normal mood and affect. His behavior is normal. Judgment and thought content normal.    ED Course  Procedures (including critical care time) Labs Review Labs Reviewed  COMPREHENSIVE METABOLIC PANEL - Abnormal; Notable for the following:    Glucose, Bld 114 (*)    All other components within normal limits  CBC WITH DIFFERENTIAL  LIPASE, BLOOD  TROPONIN I   Imaging Review Dg Chest Port 1 View  09/06/2013   CLINICAL DATA:  Left-sided chest pain.  EXAM: PORTABLE CHEST - 1 VIEW  COMPARISON:  04/26/2013  FINDINGS: Hypoaeration with interstitial coarsening. No definite cardiomegaly given the above. No asymmetric density. No effusion or pneumothorax. No acute osseous findings.  IMPRESSION: Hypoaeration, which likely accounts for diffuse interstitial prominence.   Electronically Signed   By: Tiburcio PeaJonathan  Watts M.D.   On: 09/06/2013 06:00    EKG Interpretation    Date/Time:  Friday September 06 2013 05:22:19 EST Ventricular Rate:  91 PR Interval:  172 QRS Duration: 115 QT Interval:  363 QTC Calculation: 447 R Axis:   33 Text Interpretation:  Sinus rhythm Incomplete right bundle branch block Low voltage, precordial leads No significant change since last tracing Confirmed by Arra Connaughton  MD, Keith Felten ([id number]) on 09/06/2013 5:44:43 AM            MDM   1. Chest wall pain   2. Muscle spasm    53 year old male with chest pain.  It does not appear to be cardiac in nature, normal  EKG, atypical type pain.  Differential includes aortic dissection, pneumothorax, chest wall strain, referred gallbladder pain.  We'll check labs, give pain medications, get chest x-ray.  6:55 AM Pain much improved after morphine, robaxin.  Labs normal.  CXR with hypoventilation, but no abn noted.  Pain reproducible with palpation of left lateral chest.  Suspect muscle spasm.  Will recheck cxr 2 view as he is able to take deep breath now.  Plan to d/c home with antispasmodic, pain medication   Olivia Mackielga M Kellsey Sansone, MD 09/06/13 [phone]

## 2013-09-29 DIAGNOSIS — Z8679 Personal history of other diseases of the circulatory system: Secondary | ICD-10-CM

## 2013-09-29 HISTORY — DX: Personal history of other diseases of the circulatory system: Z86.79

## 2013-09-30 ENCOUNTER — Telehealth (HOSPITAL_COMMUNITY): Payer: Self-pay | Admitting: *Deleted

## 2013-09-30 NOTE — Telephone Encounter (Signed)
Pt's wife called for pt, he has been in a-fib since Sat night, she states HR was a little fast yesterday but only 70-80s today, she states pt is much more SOB and is having chest pain across his chest which is similar to the muscular pain he had a few weeks ago.  Discussed w/Dr Bensimhon he would prefer pt to call Dr Deno Lunger at [facility] since he has done ablation in past and pt recently saw him, wife is agreeable, if unable to reach him she will call us back

## 2013-10-01 ENCOUNTER — Ambulatory Visit (HOSPITAL_BASED_OUTPATIENT_CLINIC_OR_DEPARTMENT_OTHER)
Admission: RE | Admit: 2013-10-01 | Discharge: 2013-10-01 | Disposition: A | Discharge status: Home / Self Care | Payer: BC Managed Care – PPO | Source: Ambulatory Visit | Attending: Internal Medicine | Admitting: Internal Medicine

## 2013-10-01 ENCOUNTER — Encounter (HOSPITAL_COMMUNITY): Payer: Self-pay

## 2013-10-01 ENCOUNTER — Inpatient Hospital Stay (HOSPITAL_COMMUNITY)
Admission: AD | Admit: 2013-10-01 | Discharge: 2013-10-04 | DRG: 315 | Disposition: A | Discharge status: Home / Self Care | Payer: BC Managed Care – PPO | Source: Ambulatory Visit | Attending: Internal Medicine | Admitting: Internal Medicine

## 2013-10-01 ENCOUNTER — Ambulatory Visit (HOSPITAL_COMMUNITY)
Admission: RE | Admit: 2013-10-01 | Discharge: 2013-10-01 | Disposition: A | Discharge status: Home / Self Care | Payer: BC Managed Care – PPO | Source: Ambulatory Visit | Attending: Internal Medicine | Admitting: Internal Medicine

## 2013-10-01 VITALS — BP 124/86 | HR 91 | Wt 387.5 lb

## 2013-10-01 DIAGNOSIS — I309 Acute pericarditis, unspecified: Secondary | ICD-10-CM | POA: Insufficient documentation

## 2013-10-01 DIAGNOSIS — I4891 Unspecified atrial fibrillation: Secondary | ICD-10-CM

## 2013-10-01 DIAGNOSIS — I10 Essential (primary) hypertension: Secondary | ICD-10-CM | POA: Diagnosis present

## 2013-10-01 DIAGNOSIS — E871 Hypo-osmolality and hyponatremia: Secondary | ICD-10-CM | POA: Diagnosis not present

## 2013-10-01 DIAGNOSIS — I4892 Unspecified atrial flutter: Secondary | ICD-10-CM

## 2013-10-01 DIAGNOSIS — Z87891 Personal history of nicotine dependence: Secondary | ICD-10-CM

## 2013-10-01 DIAGNOSIS — I319 Disease of pericardium, unspecified: Secondary | ICD-10-CM

## 2013-10-01 DIAGNOSIS — Z6841 Body Mass Index (BMI) 40.0 and over, adult: Secondary | ICD-10-CM

## 2013-10-01 DIAGNOSIS — L4050 Arthropathic psoriasis, unspecified: Secondary | ICD-10-CM | POA: Diagnosis present

## 2013-10-01 DIAGNOSIS — I428 Other cardiomyopathies: Secondary | ICD-10-CM | POA: Diagnosis present

## 2013-10-01 LAB — BASIC METABOLIC PANEL
BUN: 20 mg/dL (ref 6–23)
CO2: 24 meq/L (ref 19–32)
CREATININE: 0.92 mg/dL (ref 0.50–1.35)
Calcium: 9.1 mg/dL (ref 8.4–10.5)
Chloride: 99 mEq/L (ref 96–112)
GFR calc non Af Amer: >90 mL/min (ref >90)
Glucose, Bld: 114 mg/dL — ABNORMAL HIGH (ref 70–99)
Potassium: 4.6 mEq/L (ref 3.7–5.3)
Sodium: 137 mEq/L (ref 137–147)

## 2013-10-01 LAB — CBC
HCT: 42.1 % (ref 39.0–52.0)
Hemoglobin: 14.7 g/dL (ref 13.0–17.0)
MCH: 31.3 pg (ref 26.0–34.0)
MCHC: 34.9 g/dL (ref 30.0–36.0)
MCV: 89.6 fL (ref 78.0–100.0)
Platelets: 171 10*3/uL (ref 150–400)
RBC: 4.7 MIL/uL (ref 4.22–5.81)
RDW: 13.5 % (ref 11.5–15.5)
WBC: 6.8 10*3/uL (ref 4.0–10.5)

## 2013-10-01 LAB — SEDIMENTATION RATE: Sed Rate: 39 mm/hr — ABNORMAL HIGH (ref 0–16)

## 2013-10-01 LAB — C-REACTIVE PROTEIN: CRP: 30 mg/dL — ABNORMAL HIGH (ref <0.60)

## 2013-10-01 LAB — TROPONIN I: Troponin I: <0.3 ng/mL (ref <0.30)

## 2013-10-01 MED ORDER — ASPIRIN EC 81 MG PO TBEC
81.0000 mg | DELAYED_RELEASE_TABLET | Freq: Every day | ORAL | Status: DC
  Administered 2013-10-02 – 2013-10-04 (×3): 81 mg via ORAL
  Filled 2013-10-01 (×3): qty 1

## 2013-10-01 MED ORDER — PSYLLIUM 95 % PO PACK
1.0000 | PACK | Freq: Two times a day (BID) | ORAL | Status: DC
  Administered 2013-10-02: 10:00:00 via ORAL
  Administered 2013-10-02 – 2013-10-03 (×2): 1 via ORAL
  Administered 2013-10-03: 11:00:00 via ORAL
  Administered 2013-10-04: 1 via ORAL
  Filled 2013-10-01 (×7): qty 1

## 2013-10-01 MED ORDER — SIMVASTATIN 10 MG PO TABS
10.0000 mg | ORAL_TABLET | Freq: Every day | ORAL | Status: DC
  Administered 2013-10-01 – 2013-10-03 (×3): 10 mg via ORAL
  Filled 2013-10-01 (×4): qty 1

## 2013-10-01 MED ORDER — NIACIN ER 500 MG PO CPCR
500.0000 mg | ORAL_CAPSULE | Freq: Every day | ORAL | Status: DC
  Administered 2013-10-02 – 2013-10-03 (×3): 500 mg via ORAL
  Filled 2013-10-01 (×4): qty 1

## 2013-10-01 MED ORDER — IRBESARTAN 75 MG PO TABS
75.0000 mg | ORAL_TABLET | Freq: Every day | ORAL | Status: DC
  Administered 2013-10-02 – 2013-10-04 (×3): 75 mg via ORAL
  Filled 2013-10-01 (×3): qty 1

## 2013-10-01 MED ORDER — SODIUM CHLORIDE 0.9 % IV SOLN: 250.0000 mL | INTRAVENOUS | Status: DC | PRN

## 2013-10-01 MED ORDER — DICLOFENAC SODIUM 75 MG PO TBEC: 75.0000 mg | DELAYED_RELEASE_TABLET | Freq: Two times a day (BID) | ORAL | Status: DC

## 2013-10-01 MED ORDER — SODIUM CHLORIDE 0.9 % IJ SOLN: 3.0000 mL | INTRAMUSCULAR | Status: DC | PRN

## 2013-10-01 MED ORDER — SODIUM CHLORIDE 0.9 % IJ SOLN
3.0000 mL | Freq: Two times a day (BID) | INTRAMUSCULAR | Status: DC
  Administered 2013-10-02 – 2013-10-04 (×5): 3 mL via INTRAVENOUS

## 2013-10-01 MED ORDER — NIACIN 500 MG PO TABS: 500.0000 mg | ORAL_TABLET | Freq: Every day | ORAL | Status: DC

## 2013-10-01 MED ORDER — POTASSIUM CHLORIDE CRYS ER 20 MEQ PO TBCR
20.0000 meq | EXTENDED_RELEASE_TABLET | ORAL | Status: DC
  Administered 2013-10-02 – 2013-10-04 (×2): 20 meq via ORAL
  Filled 2013-10-01: qty 1

## 2013-10-01 MED ORDER — APIXABAN 5 MG PO TABS
5.0000 mg | ORAL_TABLET | Freq: Two times a day (BID) | ORAL | Status: DC
  Administered 2013-10-02 – 2013-10-04 (×6): 5 mg via ORAL
  Filled 2013-10-01 (×7): qty 1

## 2013-10-01 MED ORDER — ACETAMINOPHEN 325 MG PO TABS: 650.0000 mg | ORAL_TABLET | ORAL | Status: DC | PRN

## 2013-10-01 MED ORDER — OXYCODONE-ACETAMINOPHEN 5-325 MG PO TABS
2.0000 | ORAL_TABLET | ORAL | Status: DC | PRN
  Administered 2013-10-01 – 2013-10-02 (×3): 2 via ORAL
  Administered 2013-10-02: 1 via ORAL
  Administered 2013-10-02 – 2013-10-04 (×6): 2 via ORAL
  Filled 2013-10-01 (×11): qty 2

## 2013-10-01 MED ORDER — MAGNESIUM OXIDE 400 (241.3 MG) MG PO TABS
200.0000 mg | ORAL_TABLET | Freq: Every day | ORAL | Status: DC
  Administered 2013-10-02 – 2013-10-04 (×3): 200 mg via ORAL
  Filled 2013-10-01 (×3): qty 0.5

## 2013-10-01 MED ORDER — DILTIAZEM HCL 100 MG IV SOLR
5.0000 mg/h | INTRAVENOUS | Status: DC
  Administered 2013-10-01: 5 mg/h via INTRAVENOUS
  Administered 2013-10-03 – 2013-10-04 (×2): 10 mg/h via INTRAVENOUS
  Filled 2013-10-01 (×6): qty 100

## 2013-10-01 MED ORDER — LORATADINE 10 MG PO TABS
10.0000 mg | ORAL_TABLET | Freq: Every day | ORAL | Status: DC
  Filled 2013-10-01 (×3): qty 1

## 2013-10-01 MED ORDER — DOFETILIDE 500 MCG PO CAPS
500.0000 ug | ORAL_CAPSULE | Freq: Two times a day (BID) | ORAL | Status: DC
  Administered 2013-10-02 – 2013-10-04 (×6): 500 ug via ORAL
  Filled 2013-10-01 (×7): qty 1

## 2013-10-01 MED ORDER — FOLIC ACID 1 MG PO TABS
1.0000 mg | ORAL_TABLET | Freq: Every day | ORAL | Status: DC
  Administered 2013-10-02 – 2013-10-04 (×3): 1 mg via ORAL
  Filled 2013-10-01 (×3): qty 1

## 2013-10-01 MED ORDER — COLCHICINE 0.6 MG PO CAPS: 1.0000 | ORAL_CAPSULE | Freq: Two times a day (BID) | ORAL | Status: DC

## 2013-10-01 MED ORDER — IBUPROFEN 800 MG PO TABS: 800.0000 mg | ORAL_TABLET | Freq: Three times a day (TID) | ORAL | Status: DC

## 2013-10-01 MED ORDER — SODIUM CHLORIDE 0.9 % IV BOLUS (SEPSIS)
500.0000 mL | Freq: Once | INTRAVENOUS | Status: AC
  Administered 2013-10-01: 500 mL via INTRAVENOUS

## 2013-10-01 MED ORDER — ONDANSETRON HCL 4 MG/2ML IJ SOLN
4.0000 mg | Freq: Four times a day (QID) | INTRAMUSCULAR | Status: DC | PRN
Start: 2013-10-01 — End: 2013-10-04

## 2013-10-01 MED ORDER — DILTIAZEM LOAD VIA INFUSION
10.0000 mg | Freq: Once | INTRAVENOUS | Status: AC
  Administered 2013-10-01: 10 mg via INTRAVENOUS
  Filled 2013-10-01: qty 10

## 2013-10-01 MED ORDER — COLCHICINE 0.6 MG PO TABS
0.6000 mg | ORAL_TABLET | Freq: Two times a day (BID) | ORAL | Status: DC
Start: 2013-10-01 — End: 2013-10-04
  Administered 2013-10-02 – 2013-10-04 (×6): 0.6 mg via ORAL
  Filled 2013-10-01 (×7): qty 1

## 2013-10-01 MED ORDER — METHOCARBAMOL 750 MG PO TABS
750.0000 mg | ORAL_TABLET | Freq: Four times a day (QID) | ORAL | Status: DC
  Administered 2013-10-02 (×2): 750 mg via ORAL
  Filled 2013-10-01 (×13): qty 1

## 2013-10-01 MED ORDER — INDOMETHACIN 50 MG PO CAPS
50.0000 mg | ORAL_CAPSULE | Freq: Three times a day (TID) | ORAL | Status: DC
  Administered 2013-10-01 – 2013-10-04 (×9): 50 mg via ORAL
  Filled 2013-10-01 (×11): qty 1

## 2013-10-01 NOTE — Progress Notes (Signed)
EKG CRITICAL VALUE     12 lead EKG performed.  Critical value noted.  Dr. Shirlee Latch notified.   Rima Blizzard C, CCT 10/01/2013 12:52 PM

## 2013-10-01 NOTE — Addendum Note (Signed)
Encounter addended by: Noralee Space, RN on: 10/01/2013 12:45 PM<BR>     Documentation filed: Orders

## 2013-10-01 NOTE — Addendum Note (Signed)
Encounter addended by: Noralee Space, RN on: 10/01/2013 12:27 PM<BR>     Documentation filed: Patient Instructions Section, Orders

## 2013-10-01 NOTE — Addendum Note (Signed)
Encounter addended by: Dolores Patty, MD on: 10/01/2013  3:05 PM<BR>     Documentation filed: Notes Section

## 2013-10-01 NOTE — Addendum Note (Signed)
Encounter addended by: Sherald Hess, NP on: 10/01/2013  1:41 PM<BR>     Documentation filed: Orders

## 2013-10-01 NOTE — Progress Notes (Addendum)
Advanced Heart Failure Rounding Note   Subjective:    Mark Gonzalez is a very pleasant 53 y/o male with a history of psoriatic arthritis, obesity, hypertension, and atrial fibrillation. In 2009 he was admitted for Tikosyn load followed by successful DC-CV. He continued on 500 mcg Tikosyn bid but later went back into A fib. He was referred to [facility] for an ablation. He had AF ablation at [facility] and was maintained in NSR on flecainide but went back into A fib. Over the last year he has required 2 cardioversions. (S/P succesful DC-CV 08/16/12 and DC-CV 04/26/13).   Last week he went back into A fib. He was instructed to stop flecainide 05/30/13 and plan for admit today. Over the weekend he noticed increased dyspnea with exertion and while playing his saxophone.   We started Tikosyn in 06/2013. He had an early recurrence of AF in about 2 weeks. Had 4 day episode of AF around Xmas but then went back into NSR and has held for the most part. Has seen Dr. Deno Lunger and tentatively planned for ablation on Monday.  About a month ago pulled a muscle in his back and was sore. Then 3 weeks ago woke up with CP. Went to ER and diagnosed with musculoskeletal CP. Treated with pain relievers and muscle relaxants. (robaxin and oxycodone). CP has been better but not completely resolved. CP worse yesterday. Radiates to shoulders. Better when leans forward. Worse with coughing and deep breathing.Took pulse yesterday and though he was in AF. Came to office today for unscheduled evaluation.   Still with CP and dyspnea. + chills. No fevers. No recent viral illness  Objective:   Weight Range:  Vital Signs:    Weight change: Filed Vitals:   10/01/13 1200  BP: 124/86  Pulse: 91    Physical Exam: General:  Fatigued appearing. No resp difficulty HEENT: normal Neck: supple. JVP fla . Carotids 2+ bilat; no bruits. No lymphadenopathy or thryomegaly appreciated. Cor: PMI nondisplaced. Regular rate & rhythm. No rubs, gallops or  murmurs. Lungs: clear Abdomen: Obese, soft, nontender, nondistended. No hepatosplenomegaly. No bruits or masses. Good bowel sounds. Extremities: no cyanosis, clubbing, rash, edema Neuro: alert & orientedx3, cranial nerves grossly intact. moves all 4 extremities w/o difficulty. Affect pleasant  Other results:  EKG: NSR 91 inferolateral upsloping ST elevation and PR depression c/w pericarditis  Imaging: No results found.    Assessment:   1. Chest pain - likely acute pericarditis 2. PAF 3. Psoriatic Arthritis  4. Obesity  5. HTN    Plan/Discussion:    He likely has acute pericarditis will treat with high-dose NSAID (ibuprofen 800 tid for at least 3-5 days) as well as colchicine 0.6 bid for 2-3 months. Will check echo to exclude effusion. Check labs today.   Length of Stay: 0  Arvilla Meres MD 10/01/2013, 11:48 AM  Addendum: Patient with recurrent AF with RVR in the office with HRs up to 180. Echo with normal EF 60% small to moderate pericardial effusion. No tamponade. Continues with CP and SOB. Will admit for treatment of AF and acute pericarditis.   Daniel Bensimhon,MD 3:05 PM

## 2013-10-01 NOTE — H&P (Signed)
ADVANCED HEART FAILURE ADMIT H& P   HPI: Mark Gonzalez is a very pleasant 53 y/o male with a history of psoriatic arthritis, obesity, hypertension, and atrial fibrillation. In 2009 he was admitted for Tikosyn load followed by successful DC-CV. He continued on 500 mcg Tikosyn bid but later went back into A fib. He was referred to [facility] for an ablation. He had AF ablation at [facility] and was maintained in NSR on flecainide but went back into A fib in October 2014. At that time Mark Gonzalez was started November 2014 followed by successful DC-CV 06/03/14. He had a recurrence pf A fib in December but then went back into NSR. He has seen Dr Mark Gonzalez with plan for an ablation. Over the last year he has required 3 cardioversions. (S/P succesful DC-CV 06/03/14).   He presented to the HF clinic today with increased dyspnea/CP. Initially he was going to go home after he had an ECHO but developed A Fib RVR with HR in 170s. + Chills. No fever or viral illnesses.  About a month ago pulled a muscle in his back and was sore. Then 3 weeks ago woke up with CP. Went to ER and diagnosed with musculoskeletal CP. Treated with pain relievers and muscle relaxants. (robaxin and oxycodone). CP has been better but not completely resolved. CP worse yesterday. Radiates to shoulders. Better when leans forward. Worse with coughing and deep breathing.Took pulse yesterday and thought he was in AF. SOB with exertion   Review of Systems:    Cardiac Review of Systems: {Y] = yes [ ]  = no  Chest Pain [  Y  ]  Resting SOB [   ] Exertional SOB  [ Y ]  Orthopnea [ Y ]   Pedal Edema [   ]    Palpitations [Y  ] Syncope  [  ]   Presyncope [   ]  General Review of Systems: [Y] = yes [  ]=no Constitional: recent weight change [  ]; anorexia [  ]; fatigue [ Y ]; nausea [  ]; night sweats [  ]; fever [  ]; or chills [  ];                                                                                                                                          Dental:  poor dentition[  ]; Last Dentist visit:   Eye : blurred vision [  ]; diplopia [   ]; vision changes [  ];  Amaurosis fugax[  ]; Resp: cough [  ];  wheezing[  ];  hemoptysis[  ]; shortness of breath[Y  ]; paroxysmal nocturnal dyspnea[  ]; dyspnea on exertion[Y  ]; or orthopnea[Y  ];  GI:  gallstones[  ], vomiting[  ];  dysphagia[  ]; melena[  ];  hematochezia [  ]; heartburn[  ];   Hx of  Colonoscopy[  ];  GU: kidney stones [  ]; hematuria[  ];   dysuria [  ];  nocturia[  ];  history of     obstruction [  ];                 Skin: rash, swelling[  ];, hair loss[  ];  peripheral edema[  ];  or itching[  ]; Musculosketetal: myalgias[  ];  joint swelling[  ];  joint erythema[  ];  joint pain[Y  ];  back pain[  ];  Heme/Lymph: bruising[  ];  bleeding[  ];  anemia[  ];  Neuro: TIA[  ];  headaches[  ];  stroke[  ];  vertigo[  ];  seizures[  ];   paresthesias[  ];  difficulty walking[  ];  Psych:depression[ Y ]; anxiety[  ];  Endocrine: diabetes[  ];  thyroid dysfunction[  ];  Immunizations: Flu [  ]; Pneumococcal[  ];  Other:  Past Medical History  Diagnosis Date  . Paroxysmal atrial fibrillation     a. s/p DCCV 09/2007, 11/2007, 04/2013 --> flecainide and chronic coumadin.  . Morbid obesity   . Hypertension   . Psoriatic arthritis   . Cellulitis   . Atrial flutter     a. s/p DCCV 08/16/2012.  Marland Kitchen. NICM (nonischemic cardiomyopathy)     a. EF 45-50% in setting of AF in past; b. Neg MV;  c. 04/2010 Echo: EF 55-60%;  d. 11/2011 Cath: nl cors.    No prescriptions prior to admission     Allergies  Allergen Reactions  . Lisinopril Other (See Comments)    cough    History   Social History  . Marital Status: Married    Spouse Name: N/A    Number of Children: N/A  . Years of Education: N/A   Occupational History  . Not on file.   Social History Main Topics  . Smoking status: Former Smoker    Types: Cigarettes    Quit date: 08/01/1990  . Smokeless tobacco: Not on file  . Alcohol Use: 1.8  - 2.4 oz/week    3-4 Glasses of wine per week  . Drug Use: No  . Sexual Activity: Not on file   Other Topics Concern  . Not on file   Social History Narrative   He is married to his wife for 20 years.  He has one child.  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  He actually went to high school with [employer] here in anesthesiology.      Family History  Problem Relation Age of Onset  . Heart disease Neg Hx     Physical Exam:  General: Fatigued appearing. No resp difficulty Wife present HEENT: normal  Neck: supple. JVP flat . Carotids 2+ bilat; no bruits. No lymphadenopathy or thryomegaly appreciated.  Cor: PMI nondisplaced. Tachycardic Irregular rate & rhythm. No rubs, gallops or murmurs.  Lungs: clear  Abdomen: Obese, soft, nontender, nondistended. No hepatosplenomegaly. No bruits or masses. Good bowel sounds.  Extremities: no cyanosis, clubbing, rash, edema  Neuro: alert & orientedx3, cranial nerves grossly intact. moves all 4 extremities w/o difficulty. Affect pleasant    ECG: A fib 167 bpm    Results for orders placed during the hospital encounter of 10/01/13 (from the past 24 hour(s))  CBC     Status: None   Collection Time    10/01/13 12:21 PM      Result Value Ref Range   WBC 6.8  4.0 - 10.5  K/uL   RBC 4.70  4.22 - 5.81 MIL/uL   Hemoglobin 14.7  13.0 - 17.0 g/dL   HCT 00.9  23.3 - 00.7 %   MCV 89.6  78.0 - 100.0 fL   MCH 31.3  26.0 - 34.0 pg   MCHC 34.9  30.0 - 36.0 g/dL   RDW 62.2  63.3 - 35.4 %   Platelets 171  150 - 400 K/uL  BASIC METABOLIC PANEL     Status: Abnormal   Collection Time    10/01/13 12:21 PM      Result Value Ref Range   Sodium 137  137 - 147 mEq/L   Potassium 4.6  3.7 - 5.3 mEq/L   Chloride 99  96 - 112 mEq/L   CO2 24  19 - 32 mEq/L   Glucose, Bld 114 (*) 70 - 99 mg/dL   BUN 20  6 - 23 mg/dL   Creatinine, Ser 5.62  0.50 - 1.35 mg/dL   Calcium 9.1  8.4 - 56.3 mg/dL   GFR calc non Af Amer >90  >90 mL/min   GFR calc Af Amer  >90  >90 mL/min  TROPONIN I     Status: None   Collection Time    10/01/13 12:21 PM      Result Value Ref Range   Troponin I <0.30  <0.30 ng/mL  SEDIMENTATION RATE     Status: Abnormal   Collection Time    10/01/13 12:21 PM      Result Value Ref Range   Sed Rate 39 (*) 0 - 16 mm/hr   No results found.   ASSESSMENT: 1. Acute Pericarditis 2. Afib RVR S/P multiple DC-CV most recent 11/3 3. Psoriatic Arthritis 4. Obesity  Plan/Discussion:    Mr Sepe is being admitted form the HF clinic today with acute pericarditis and A fib RVR with HR up to 180 in the HF clinic.  Start indocin 50 mg every 8 hours and colchicine 0.6 mg twice a day. Will also give 10 mg cardizem bolus and start cardizem drip at 10 mg per hour. Continue apixaban 5 mg twice a day. Also give 500 cc NS bolus to support the RV. Check labs. BMET, CBC, Troponin, Sed Rate.   ECHO completed today normal EF 60% small to moderate pericardial effusion. No tamponade   CLEGG,AMY NP-C  10/01/2013, 3:46 PM  Advanced Heart Failure Team Pager [phone] (M-F; 7a - 4p)  Please contact [facility] for night-coverage after hours (4p -7a ) and weekends on [email or web address]  Patient seen and examined with [name], NP. We discussed all aspects of the encounter. I agree with the assessment and plan as stated above.  Echo reviewed. He has acute pericarditis with associated effusion and recurrent AF with RVR. Will admit for rate control of AF and treatment of pericarditis as above.   Daniel Bensimhon,MD 12:56 PM

## 2013-10-01 NOTE — Addendum Note (Signed)
Encounter addended by: Simon Rhein, CCT on: 10/01/2013 12:54 PM<BR>     Documentation filed: Clinical Notes

## 2013-10-01 NOTE — Addendum Note (Signed)
Encounter addended by: Noralee Space, RN on: 10/01/2013  2:36 PM<BR>     Documentation filed: Vitals Section

## 2013-10-01 NOTE — Progress Notes (Signed)
Echo Lab  2D Echocardiogram completed.  Masie Bermingham L Quantel Mcinturff, RDCS 10/01/2013 1:56 PM

## 2013-10-01 NOTE — Patient Instructions (Signed)
Labs today  Echocardiogram  Ibuprofen 800 mg Three times a day   Colchicine 0.6 mg Twice daily   Your physician recommends that you schedule a follow-up appointment in: 1 month

## 2013-10-02 ENCOUNTER — Inpatient Hospital Stay (HOSPITAL_COMMUNITY): Payer: BC Managed Care – PPO

## 2013-10-02 DIAGNOSIS — I309 Acute pericarditis, unspecified: Principal | ICD-10-CM

## 2013-10-02 LAB — URINALYSIS, ROUTINE W REFLEX MICROSCOPIC
Glucose, UA: NEGATIVE mg/dL
Hgb urine dipstick: NEGATIVE
Ketones, ur: 15 mg/dL — AB
Leukocytes, UA: NEGATIVE
Nitrite: NEGATIVE
Protein, ur: NEGATIVE mg/dL
SPECIFIC GRAVITY, URINE: 1.026 (ref 1.005–1.030)
UROBILINOGEN UA: 1 mg/dL (ref 0.0–1.0)
pH: 6 (ref 5.0–8.0)

## 2013-10-02 LAB — BASIC METABOLIC PANEL
BUN: 27 mg/dL — ABNORMAL HIGH (ref 6–23)
CO2: 20 mEq/L (ref 19–32)
Calcium: 8.5 mg/dL (ref 8.4–10.5)
Chloride: 98 mEq/L (ref 96–112)
Creatinine, Ser: 0.87 mg/dL (ref 0.50–1.35)
GFR calc Af Amer: >90 mL/min (ref >90)
GLUCOSE: 133 mg/dL — AB (ref 70–99)
POTASSIUM: 3.8 meq/L (ref 3.7–5.3)
Sodium: 133 mEq/L — ABNORMAL LOW (ref 137–147)

## 2013-10-02 MED ORDER — FUROSEMIDE 10 MG/ML IJ SOLN
INTRAMUSCULAR | Status: AC
  Administered 2013-10-02: 40 mg via INTRAVENOUS
  Filled 2013-10-02: qty 4

## 2013-10-02 MED ORDER — FUROSEMIDE 10 MG/ML IJ SOLN
40.0000 mg | Freq: Once | INTRAMUSCULAR | Status: AC
  Administered 2013-10-02: 40 mg via INTRAVENOUS

## 2013-10-02 MED ORDER — PANTOPRAZOLE SODIUM 40 MG PO TBEC
40.0000 mg | DELAYED_RELEASE_TABLET | Freq: Two times a day (BID) | ORAL | Status: DC
  Administered 2013-10-02 – 2013-10-04 (×4): 40 mg via ORAL
  Filled 2013-10-02 (×3): qty 1

## 2013-10-02 MED ORDER — POTASSIUM CHLORIDE CRYS ER 10 MEQ PO TBCR
EXTENDED_RELEASE_TABLET | ORAL | Status: AC
  Administered 2013-10-02: 20 meq via ORAL
  Filled 2013-10-02: qty 2

## 2013-10-02 NOTE — Care Management Note (Signed)
    Page 1 of 1   10/02/2013     7:23:25 AM   CARE MANAGEMENT NOTE 10/02/2013  Patient:  Mark Gonzalez, Mark Gonzalez   Account Number:  [id number]  Date Initiated:  10/02/2013  Documentation initiated by:  Junius Creamer  Subjective/Objective Assessment:   adm w at fib w rvr     Action/Plan:   lives w wife   Anticipated DC Date:     Anticipated DC Plan:           Choice offered to / List presented to:             Status of service:   Medicare Important Message given?   (If response is "NO", the following Medicare IM given date fields will be blank) Date Medicare IM given:   Date Additional Medicare IM given:    Discharge Disposition:    Per UR Regulation:  Reviewed for med. necessity/level of care/duration of stay  If discussed at Long Length of Stay Meetings, dates discussed:    Comments:

## 2013-10-02 NOTE — Addendum Note (Signed)
Encounter addended by: Ernestina Penna, CCT on: 10/02/2013  8:58 AM<BR>     Documentation filed: Charges VN

## 2013-10-02 NOTE — Progress Notes (Signed)
ADVANCED HEART FAILURE ADMIT H& P   HPI: [name] is a very pleasant 53 y/o male with a history of psoriatic arthritis, obesity, hypertension, and atrial fibrillation s/p AF ablation with AF recurrence. Maintained on Tikosyn  Admitted 3/3 from clinic with acute pericarditis with small to moderate effusion and recurrent AF with RVR. Now on dilt gtt, indomethacin and colchicine.  Converted to sinus this afternoon. CP some better but still requiring Percocet for breakthrough pain on top of indocin and colchicine. + dyspnea at rest. Urine very dark.    OBJECTIVE:  Physical Exam:  General: Fatigued appearing. No resp difficulty Wife present HEENT: normal  Neck: supple. JVP 9 no kussmaul's . Carotids 2+ bilat; no bruits. No lymphadenopathy or thryomegaly appreciated.  Cor: PMI nondisplaced. Tachycardic Irregular rate & rhythm. No rubs, gallops or murmurs.  Lungs: clear bu decreased L base Abdomen: Obese, soft, nontender, nondistended. No hepatosplenomegaly. No bruits or masses. Good bowel sounds.  Extremities: no cyanosis, clubbing, rash, Tr edema  Neuro: alert & orientedx3, cranial nerves grossly intact. moves all 4 extremities w/o difficulty. Affect pleasant    Tele: AF 130 this am. Now NSR 80s  Results for orders placed during the hospital encounter of 10/01/13 (from the past 24 hour(s))  BASIC METABOLIC PANEL     Status: Abnormal   Collection Time    10/02/13  2:50 AM      Result Value Ref Range   Sodium 133 (*) 137 - 147 mEq/L   Potassium 3.8  3.7 - 5.3 mEq/L   Chloride 98  96 - 112 mEq/L   CO2 20  19 - 32 mEq/L   Glucose, Bld 133 (*) 70 - 99 mg/dL   BUN 27 (*) 6 - 23 mg/dL   Creatinine, Ser 7.56  0.50 - 1.35 mg/dL   Calcium 8.5  8.4 - 43.3 mg/dL   GFR calc non Af Amer >90  >90 mL/min   GFR calc Af Amer >90  >90 mL/min   No results found.   ASSESSMENT: 1. Acute Pericarditis 2. Afib RVR S/P multiple DC-CV most recent 11/3 3. Psoriatic Arthritis 4. Obesity 5.  Hyponatremia  Plan/Discussion:    Still with CP. Dyspnea getting worse despite him being back in sinus. Looks volume overloaded. Will give one dose of lasix and get CXR to exclude L effusion. Continue NSAIDs and colchicine. May need steroids. Check UA. Continue Eliquis.    Arvilla Meres MD  10/02/2013, 12:56 PM  Advanced Heart Failure Team Pager [phone] (M-F; 7a - 4p)  Please contact [facility] for night-coverage after hours (4p -7a ) and weekends on [email or web address]

## 2013-10-03 DIAGNOSIS — I4891 Unspecified atrial fibrillation: Secondary | ICD-10-CM

## 2013-10-03 LAB — BASIC METABOLIC PANEL
BUN: 19 mg/dL (ref 6–23)
CHLORIDE: 97 meq/L (ref 96–112)
CO2: 23 mEq/L (ref 19–32)
Calcium: 8.8 mg/dL (ref 8.4–10.5)
Creatinine, Ser: 0.72 mg/dL (ref 0.50–1.35)
GFR calc non Af Amer: >90 mL/min (ref >90)
Glucose, Bld: 112 mg/dL — ABNORMAL HIGH (ref 70–99)
POTASSIUM: 3.7 meq/L (ref 3.7–5.3)
Sodium: 134 mEq/L — ABNORMAL LOW (ref 137–147)

## 2013-10-03 LAB — MAGNESIUM: MAGNESIUM: 2.4 mg/dL (ref 1.5–2.5)

## 2013-10-03 MED ORDER — PREDNISONE 50 MG PO TABS
60.0000 mg | ORAL_TABLET | Freq: Every day | ORAL | Status: DC
  Administered 2013-10-03 – 2013-10-04 (×2): 60 mg via ORAL
  Filled 2013-10-03 (×3): qty 1

## 2013-10-03 MED ORDER — MAGNESIUM HYDROXIDE 400 MG/5ML PO SUSP
15.0000 mL | Freq: Every day | ORAL | Status: DC | PRN
  Administered 2013-10-03 – 2013-10-04 (×2): 15 mL via ORAL
  Filled 2013-10-03 (×3): qty 30

## 2013-10-03 MED ORDER — PREDNISONE 10 MG PO TABS: 10.0000 mg | ORAL_TABLET | Freq: Every day | ORAL | Status: DC

## 2013-10-03 MED ORDER — FUROSEMIDE 10 MG/ML IJ SOLN
40.0000 mg | Freq: Once | INTRAMUSCULAR | Status: AC
  Administered 2013-10-03: 40 mg via INTRAVENOUS
  Filled 2013-10-03: qty 4

## 2013-10-03 MED ORDER — PREDNISONE 20 MG PO TABS: 40.0000 mg | ORAL_TABLET | Freq: Every day | ORAL | Status: DC

## 2013-10-03 MED ORDER — PREDNISONE 20 MG PO TABS: 20.0000 mg | ORAL_TABLET | Freq: Every day | ORAL | Status: DC

## 2013-10-03 NOTE — Progress Notes (Signed)
ADVANCED HEART FAILURE ADMIT H& P   HPI: Mark Gonzalez is a very pleasant 53 y/o male with a history of psoriatic arthritis, obesity, hypertension, and atrial fibrillation s/p AF ablation with AF recurrence. Maintained on Tikosyn  Admitted 3/3 from clinic with acute pericarditis with small to moderate effusion and recurrent AF with RVR. Now on dilt gtt, indomethacin and colchicine.  Converted to sinus. Yesterday more SOB. Treated with IV lasix. CXR with R effusion.   Diuresed well but still SOB. Continued pain with breathing and movement.   OBJECTIVE:  Physical Exam:  General: Sitting in chairNo resp difficulty Wife present HEENT: normal  Neck: supple. JVP 8 no kussmaul's . Carotids 2+ bilat; no bruits. No lymphadenopathy or thryomegaly appreciated.  Cor: PMI nondisplaced. Tachycardic Irregular rate & rhythm. No rubs, gallops or murmurs.  Lungs: clear but decreased R base Abdomen: Obese, soft, nontender, nondistended. No hepatosplenomegaly. No bruits or masses. Good bowel sounds.  Extremities: no cyanosis, clubbing, rash, Tr edema  Neuro: alert & orientedx3, cranial nerves grossly intact. moves all 4 extremities w/o difficulty. Affect pleasant    Tele: NSR 70-80s  Results for orders placed during the hospital encounter of 10/01/13 (from the past 24 hour(s))  URINALYSIS, ROUTINE W REFLEX MICROSCOPIC     Status: Abnormal   Collection Time    10/02/13 10:48 PM      Result Value Ref Range   Color, Urine AMBER (*) YELLOW   APPearance CLEAR  CLEAR   Specific Gravity, Urine 1.026  1.005 - 1.030   pH 6.0  5.0 - 8.0   Glucose, UA NEGATIVE  NEGATIVE mg/dL   Hgb urine dipstick NEGATIVE  NEGATIVE   Bilirubin Urine SMALL (*) NEGATIVE   Ketones, ur 15 (*) NEGATIVE mg/dL   Protein, ur NEGATIVE  NEGATIVE mg/dL   Urobilinogen, UA 1.0  0.0 - 1.0 mg/dL   Nitrite NEGATIVE  NEGATIVE   Leukocytes, UA NEGATIVE  NEGATIVE  BASIC METABOLIC PANEL     Status: Abnormal   Collection Time    10/03/13  2:47 AM       Result Value Ref Range   Sodium 134 (*) 137 - 147 mEq/L   Potassium 3.7  3.7 - 5.3 mEq/L   Chloride 97  96 - 112 mEq/L   CO2 23  19 - 32 mEq/L   Glucose, Bld 112 (*) 70 - 99 mg/dL   BUN 19  6 - 23 mg/dL   Creatinine, Ser 1.610.72  0.50 - 1.35 mg/dL   Calcium 8.8  8.4 - 09.610.5 mg/dL   GFR calc non Af Amer >90  >90 mL/min   GFR calc Af Amer >90  >90 mL/min  MAGNESIUM     Status: None   Collection Time    10/03/13  2:47 AM      Result Value Ref Range   Magnesium 2.4  1.5 - 2.5 mg/dL   Dg Chest 2 View  0/4/54093/10/2013   CLINICAL DATA:  53 year old male with shortness of breath and hypertension. Initial encounter.  EXAM: CHEST  2 VIEW  COMPARISON:  09/06/2013 and earlier.  FINDINGS: Chronically low lung volumes. Stable cardiomegaly and mediastinal contours. Visualized tracheal air column is within normal limits. No pneumothorax.  New fluid in the right minor fissure. Increased right pleural effusion and patchy opacity at the right lung base. Stable ventilation elsewhere. Pulmonary vascularity does appear increased, but overt edema is not strongly indicated. Stable visualized osseous structures.  IMPRESSION: Increase right pleural effusion and patchy right lung base  opacity. This may be related to the vascular congestion/ interstitial edema. Right lung base infection felt less likely.   Electronically Signed   By: Augusto Gamble M.D.   On: 10/02/2013 16:18     ASSESSMENT: 1. Acute Pericarditis 2. Afib RVR S/P multiple DC-CV most recent 11/3 3. Psoriatic Arthritis 4. Obesity 5. Hyponatremia  Plan/Discussion:    Still with CP and dyspnea despite aggressive treatment with NSAIDs and colchicine. Will add steroids. Continue Eliquis. Will give one more dose IV lasix.    Arvilla Meres MD  10/03/2013, 10:02 AM  Advanced Heart Failure Team Pager [phone] (M-F; 7a - 4p)  Please contact [facility] for night-coverage after hours (4p -7a ) and weekends on [email or web address]

## 2013-10-03 NOTE — Discharge Instructions (Signed)
Information on my medicine - ELIQUIS® (apixaban) °Why was Eliquis® prescribed for you? °Eliquis® was prescribed for you to reduce the risk of a blood clot forming that can cause a stroke if you have a medical condition called atrial fibrillation (a type of irregular heartbeat). ° °What do You need to know about Eliquis® ? °Take your Eliquis® TWICE DAILY - one tablet in the morning and one tablet in the evening with or without food. If you have difficulty swallowing the tablet whole please discuss with your pharmacist how to take the medication safely. ° °Take Eliquis® exactly as prescribed by your doctor and DO NOT stop taking Eliquis® without talking to the doctor who prescribed the medication.  Stopping may increase your risk of developing a stroke.  Refill your prescription before you run out. ° °After discharge, you should have regular check-up appointments with your healthcare provider that is prescribing your Eliquis®.  In the future your dose may need to be changed if your kidney function or weight changes by a significant amount or as you get older. ° °What do you do if you miss a dose? °If you miss a dose, take it as soon as you remember on the same day and resume taking twice daily.  Do not take more than one dose of ELIQUIS at the same time to make up a missed dose. ° °Important Safety Information °A possible side effect of Eliquis® is bleeding. You should call your healthcare provider right away if you experience any of the following: °  Bleeding from an injury or your nose that does not stop. °  Unusual colored urine (red or dark brown) or unusual colored stools (red or black). °  Unusual bruising for unknown reasons. °  A serious fall or if you hit your head (even if there is no bleeding). ° °Some medicines may interact with Eliquis® and might increase your risk of bleeding or clotting while on Eliquis®. To help avoid this, consult your healthcare provider or pharmacist prior to using any new  prescription or non-prescription medications, including herbals, vitamins, non-steroidal anti-inflammatory drugs (NSAIDs) and supplements. ° °This website has more information on Eliquis® (apixaban): www.Eliquis.com. ° °

## 2013-10-04 LAB — BASIC METABOLIC PANEL
BUN: 21 mg/dL (ref 6–23)
CO2: 22 mEq/L (ref 19–32)
Calcium: 8.8 mg/dL (ref 8.4–10.5)
Chloride: 98 mEq/L (ref 96–112)
Creatinine, Ser: 0.79 mg/dL (ref 0.50–1.35)
GFR calc Af Amer: >90 mL/min (ref >90)
Glucose, Bld: 121 mg/dL — ABNORMAL HIGH (ref 70–99)
Potassium: 4.2 mEq/L (ref 3.7–5.3)
SODIUM: 135 meq/L — AB (ref 137–147)

## 2013-10-04 MED ORDER — POTASSIUM CHLORIDE CRYS ER 20 MEQ PO TBCR: 20.0000 meq | EXTENDED_RELEASE_TABLET | ORAL | Status: DC | PRN

## 2013-10-04 MED ORDER — PREDNISONE 20 MG PO TABS
40.0000 mg | ORAL_TABLET | Freq: Every day | ORAL | Status: DC
  Filled 2013-10-04: qty 2

## 2013-10-04 MED ORDER — FUROSEMIDE 20 MG PO TABS: 20.0000 mg | ORAL_TABLET | ORAL | Status: DC | PRN

## 2013-10-04 MED ORDER — PREDNISONE 20 MG PO TABS: 30.0000 mg | ORAL_TABLET | Freq: Every day | ORAL | Status: DC

## 2013-10-04 MED ORDER — PREDNISONE 10 MG PO TABS: 10.0000 mg | ORAL_TABLET | Freq: Every day | ORAL | Status: DC

## 2013-10-04 MED ORDER — PANTOPRAZOLE SODIUM 40 MG PO TBEC: 40.0000 mg | DELAYED_RELEASE_TABLET | Freq: Two times a day (BID) | ORAL | Status: DC

## 2013-10-04 MED ORDER — PREDNISONE 20 MG PO TABS: 20.0000 mg | ORAL_TABLET | Freq: Every day | ORAL | Status: DC

## 2013-10-04 MED ORDER — PREDNISONE 20 MG PO TABS: ORAL_TABLET | ORAL | Status: DC

## 2013-10-04 NOTE — Progress Notes (Signed)
ADVANCED HEART FAILURE ADMIT H& P   HPI: Mark Gonzalez is a very pleasant 53 y/o male with a history of psoriatic arthritis, obesity, hypertension, and atrial fibrillation s/p AF ablation with AF recurrence. Maintained on Tikosyn  Admitted 3/3 from clinic with acute pericarditis with small to moderate effusion and recurrent AF with RVR.   Started on prednisone yesterday am due to severe persistent pain despite indocin and colchicine and feeling much better though does have manic symptoms. Diuresing well with IV lasix.   Maintaining sinus.   ANA pending   OBJECTIVE:  Physical Exam:  General: Sitting in chair No resp difficulty Wife present HEENT: normal  Neck: supple. JVP flat no kussmaul's . Carotids 2+ bilat; no bruits. No lymphadenopathy or thryomegaly appreciated.  Cor: PMI nondisplaced. Tachycardic Irregular rate & rhythm. No rubs, gallops or murmurs.  Lungs: clear but decreased R base Abdomen: Obese, soft, nontender, nondistended. No hepatosplenomegaly. No bruits or masses. Good bowel sounds.  Extremities: no cyanosis, clubbing, rash, Tr edema  Neuro: alert & orientedx3, cranial nerves grossly intact. moves all 4 extremities w/o difficulty. Affect pleasant    Tele: NSR 70-80s  Results for orders placed during the hospital encounter of 10/01/13 (from the past 24 hour(s))  BASIC METABOLIC PANEL     Status: Abnormal   Collection Time    10/04/13  2:24 AM      Result Value Ref Range   Sodium 135 (*) 137 - 147 mEq/L   Potassium 4.2  3.7 - 5.3 mEq/L   Chloride 98  96 - 112 mEq/L   CO2 22  19 - 32 mEq/L   Glucose, Bld 121 (*) 70 - 99 mg/dL   BUN 21  6 - 23 mg/dL   Creatinine, Ser 8.50  0.50 - 1.35 mg/dL   Calcium 8.8  8.4 - 27.7 mg/dL   GFR calc non Af Amer >90  >90 mL/min   GFR calc Af Amer >90  >90 mL/min   Dg Chest 2 View  10/02/2013   CLINICAL DATA:  53 year old male with shortness of breath and hypertension. Initial encounter.  EXAM: CHEST  2 VIEW  COMPARISON:  09/06/2013 and  earlier.  FINDINGS: Chronically low lung volumes. Stable cardiomegaly and mediastinal contours. Visualized tracheal air column is within normal limits. No pneumothorax.  New fluid in the right minor fissure. Increased right pleural effusion and patchy opacity at the right lung base. Stable ventilation elsewhere. Pulmonary vascularity does appear increased, but overt edema is not strongly indicated. Stable visualized osseous structures.  IMPRESSION: Increase right pleural effusion and patchy right lung base opacity. This may be related to the vascular congestion/ interstitial edema. Right lung base infection felt less likely.   Electronically Signed   By: Augusto Gamble M.D.   On: 10/02/2013 16:18     ASSESSMENT: 1. Acute Pericarditis 2. Afib RVR S/P multiple DC-CV most recent 11/3 3. Psoriatic Arthritis 4. Obesity 5. Hyponatremia  Plan/Discussion:    Much improved. Can go home today with steroid taper 40,40,30,30,20, 20,10,10 and colchicine 0.6 bid x 2-3 months. Continue protonix and Eliquis. Can take extra lasix prn as needed.   F/u with me in 2 weeks   Arvilla Meres MD  10/04/2013, 11:04 AM  Advanced Heart Failure Team Pager [phone] (M-F; 7a - 4p)  Please contact [facility] for night-coverage after hours (4p -7a ) and weekends on [email or web address]

## 2013-10-04 NOTE — Discharge Summary (Signed)
Advanced Heart Failure Team  Discharge Summary   Patient ID: Mark Gonzalez. MRN: [id number], DOB/AGE: 1961/04/24 53 y.o. Admit date: 10/01/2013 D/C date:     10/04/2013   Primary Discharge Diagnoses:  1. Acute Pericarditis  2. Afib RVR S/P multiple DC-CV most recent 11/3   Secondary Discharge Diagnoses:  1. Psoriatic Arthritis  2. Obesity  3. Hyponatremia  Hospital Course:  Mark Gonzalez is a very pleasant 53 y/o male with a history of psoriatic arthritis, obesity, hypertension, and atrial fibrillation s/p AF ablation with AF recurrence. He had been maintained on Tikosyn   He was admitted from the HF clinic with acute pericarditis with small to moderate effusion and recurrent AF with RVR. He was started on diltiazem drip,indomethacin, and colchicine. He converted to sinus rhythm and transitioned to po cardizem and he continued on his home dose of Tikosyn. His pain did not respond well to NSAIDS and colchicine so steroids were added with significant improvement. He also required prn percocet. He was also diuresed gently. He will continue steroid taper at discharge as well as colchicine for the next 2-3 months. He continued on eliquis while hospitalized.   He will continue to be followed closely by Dr Gala Romney with follow March 20 at 11:00.     Discharge Weight Range: 383  Pounds.   Discharge Vitals: Blood pressure 128/77, pulse 55, temperature 97.7 F (36.5 C), temperature source Oral, resp. rate 12, height 6' (1.829 m), weight 383 lb 13.1 oz (174.1 kg), SpO2 95.00%.  Labs: Lab Results  Component Value Date   WBC 6.8 10/01/2013   HGB 14.7 10/01/2013   HCT 42.1 10/01/2013   MCV 89.6 10/01/2013   PLT 171 10/01/2013    Recent Labs Lab 10/04/13 0224  NA 135*  K 4.2  CL 98  CO2 22  BUN 21  CREATININE 0.79  CALCIUM 8.8  GLUCOSE 121*   Lab Results  Component Value Date   CHOL 136 04/22/2010   HDL 34.10* 04/22/2010   LDLCALC 92 04/22/2010   TRIG 52.0 04/22/2010   BNP (last 3  results) No results found for this basename: PROBNP,  in the last 8760 hours  Diagnostic Studies/Procedures   Dg Chest 2 View  10/02/2013   CLINICAL DATA:  53 year old male with shortness of breath and hypertension. Initial encounter.  EXAM: CHEST  2 VIEW  COMPARISON:  09/06/2013 and earlier.  FINDINGS: Chronically low lung volumes. Stable cardiomegaly and mediastinal contours. Visualized tracheal air column is within normal limits. No pneumothorax.  New fluid in the right minor fissure. Increased right pleural effusion and patchy opacity at the right lung base. Stable ventilation elsewhere. Pulmonary vascularity does appear increased, but overt edema is not strongly indicated. Stable visualized osseous structures.  IMPRESSION: Increase right pleural effusion and patchy right lung base opacity. This may be related to the vascular congestion/ interstitial edema. Right lung base infection felt less likely.   Electronically Signed   By: Augusto Gamble M.D.   On: 10/02/2013 16:18    Discharge Medications     Medication List    STOP taking these medications       ranitidine 150 MG tablet  Commonly known as:  ZANTAC      TAKE these medications       apixaban 5 MG Tabs tablet  Commonly known as:  ELIQUIS  Take 1 tablet (5 mg total) by mouth 2 (two) times daily.     aspirin 81 MG tablet  Take 81 mg by  mouth at bedtime.     cetirizine 10 MG tablet  Commonly known as:  ZYRTEC  Take 10 mg by mouth as directed. TAKE 1 TABLET X 3 DAYS BEFORE THE REMACAID INJECTION     cholecalciferol 1000 UNITS tablet  Commonly known as:  VITAMIN D  Take 1,000 Units by mouth every morning.     Colchicine 0.6 MG Caps  Take 1 capsule by mouth 2 (two) times daily.     diclofenac-misoprostol 75-0.2 MG per tablet  Commonly known as:  ARTHROTEC 75  Take 1 tablet by mouth 2 (two) times daily.     diltiazem 180 MG 24 hr capsule  Commonly known as:  CARDIZEM CD  Take 1 capsule (180 mg total) by mouth every morning.      dofetilide 500 MCG capsule  Commonly known as:  TIKOSYN  Take 1 capsule (500 mcg total) by mouth every 12 (twelve) hours.     fish oil-omega-3 fatty acids 1000 MG capsule  Take 1 g by mouth 2 (two) times daily.     folic acid 1 MG tablet  Commonly known as:  FOLVITE  Take 1 mg by mouth every evening.     furosemide 20 MG tablet  Commonly known as:  LASIX  Take 1 tablet (20 mg total) by mouth as needed. Takes in the morning     ibuprofen 800 MG tablet  Commonly known as:  ADVIL,MOTRIN  Take 1 tablet (800 mg total) by mouth 3 (three) times daily.     inFLIXimab 100 MG injection  Commonly known as:  REMICADE  Inject 100 mg into the vein every 30 (thirty) days.     lovastatin 20 MG tablet  Commonly known as:  MEVACOR  take 1 tablet by mouth at bedtime     MAGNESIUM PO  Take 1 tablet by mouth daily.     METHOTREXATE SODIUM IJ  Inject 0.6 mLs as directed every 7 (seven) days. Sundays in evenings     multivitamin tablet  Take 1 tablet by mouth every morning.     niacin 500 MG CR tablet  Commonly known as:  NIASPAN  Take 500 mg by mouth every evening.     pantoprazole 40 MG tablet  Commonly known as:  PROTONIX  Take 1 tablet (40 mg total) by mouth 2 (two) times daily before a meal.     potassium chloride SA 20 MEQ tablet  Commonly known as:  K-DUR,KLOR-CON  Take 1 tablet (20 mEq total) by mouth as needed. Take 20 meq when you take lasix     predniSONE 20 MG tablet  Commonly known as:  DELTASONE  Take 40 mg for 2 days then take 30 mg for 2 days, then take 20 mg for 2 days then take 10 mg for 2 days.     Psyllium Husk Powd  Take 1 Package by mouth 2 (two) times daily. For bowel regularity     valsartan 80 MG tablet  Commonly known as:  DIOVAN  Take 80 mg by mouth daily.        Disposition   The patient will be discharged in stable condition to home.     Discharge Orders   Future Appointments Provider Department Dept Phone   10/18/2013 11:00 AM [facility] [phone]   10/28/2013 9:40 AM [facility] [phone]   Future Orders Complete By Expires   ACE Inhibitor /  ARB already ordered  As directed    Diet - low sodium heart healthy  As directed    Increase activity slowly  As directed      Follow-up Information   Follow up with Arvilla Meres, MD On 10/18/2013. (at 1100 [address])    Specialty:  Cardiology   Contact information:   [address] [place] [address] [phone]         Duration of Discharge Encounter: Greater than 35 minutes   Signed, CLEGG,AMY NP-C  10/04/2013, 1:01 PM  Patient seen and examined with [name], NP. We discussed all aspects of the encounter. I agree with the assessment and plan as stated above. I have edited the note with my changes.  Arnie Clingenpeel,MD 11:15 PM

## 2013-10-07 LAB — ANTI-NUCLEAR AB-TITER (ANA TITER)

## 2013-10-07 LAB — ANA: ANA: POSITIVE — AB

## 2013-10-11 ENCOUNTER — Emergency Department (HOSPITAL_COMMUNITY): Payer: BC Managed Care – PPO

## 2013-10-11 ENCOUNTER — Observation Stay (HOSPITAL_COMMUNITY)
Admission: EM | Admit: 2013-10-11 | Discharge: 2013-10-16 | Disposition: A | Discharge status: Home / Self Care | Payer: BC Managed Care – PPO | Attending: Internal Medicine | Admitting: Internal Medicine

## 2013-10-11 ENCOUNTER — Encounter (HOSPITAL_COMMUNITY): Payer: Self-pay | Admitting: Emergency Medicine

## 2013-10-11 DIAGNOSIS — I4891 Unspecified atrial fibrillation: Secondary | ICD-10-CM

## 2013-10-11 DIAGNOSIS — I309 Acute pericarditis, unspecified: Principal | ICD-10-CM | POA: Insufficient documentation

## 2013-10-11 DIAGNOSIS — L4050 Arthropathic psoriasis, unspecified: Secondary | ICD-10-CM | POA: Insufficient documentation

## 2013-10-11 DIAGNOSIS — Z87891 Personal history of nicotine dependence: Secondary | ICD-10-CM | POA: Insufficient documentation

## 2013-10-11 DIAGNOSIS — I313 Pericardial effusion (noninflammatory): Secondary | ICD-10-CM

## 2013-10-11 DIAGNOSIS — R0789 Other chest pain: Secondary | ICD-10-CM

## 2013-10-11 DIAGNOSIS — M25519 Pain in unspecified shoulder: Secondary | ICD-10-CM | POA: Insufficient documentation

## 2013-10-11 DIAGNOSIS — I517 Cardiomegaly: Secondary | ICD-10-CM | POA: Insufficient documentation

## 2013-10-11 DIAGNOSIS — I319 Disease of pericardium, unspecified: Secondary | ICD-10-CM | POA: Insufficient documentation

## 2013-10-11 DIAGNOSIS — J90 Pleural effusion, not elsewhere classified: Secondary | ICD-10-CM | POA: Insufficient documentation

## 2013-10-11 DIAGNOSIS — I3139 Other pericardial effusion (noninflammatory): Secondary | ICD-10-CM

## 2013-10-11 DIAGNOSIS — I428 Other cardiomyopathies: Secondary | ICD-10-CM | POA: Insufficient documentation

## 2013-10-11 DIAGNOSIS — I10 Essential (primary) hypertension: Secondary | ICD-10-CM | POA: Insufficient documentation

## 2013-10-11 LAB — CBC WITH DIFFERENTIAL/PLATELET
BASOS PCT: 0 % (ref 0–1)
Basophils Absolute: 0 10*3/uL (ref 0.0–0.1)
Eosinophils Absolute: 0.1 10*3/uL (ref 0.0–0.7)
Eosinophils Relative: 1 % (ref 0–5)
HCT: 40.9 % (ref 39.0–52.0)
Hemoglobin: 13.9 g/dL (ref 13.0–17.0)
Lymphocytes Relative: 18 % (ref 12–46)
Lymphs Abs: 1.5 10*3/uL (ref 0.7–4.0)
MCH: 30.1 pg (ref 26.0–34.0)
MCHC: 34 g/dL (ref 30.0–36.0)
MCV: 88.5 fL (ref 78.0–100.0)
MONO ABS: 0.7 10*3/uL (ref 0.1–1.0)
Monocytes Relative: 9 % (ref 3–12)
NEUTROS ABS: 6.1 10*3/uL (ref 1.7–7.7)
Neutrophils Relative %: 73 % (ref 43–77)
Platelets: 243 10*3/uL (ref 150–400)
RBC: 4.62 MIL/uL (ref 4.22–5.81)
RDW: 13.9 % (ref 11.5–15.5)
WBC: 8.3 10*3/uL (ref 4.0–10.5)

## 2013-10-11 LAB — COMPREHENSIVE METABOLIC PANEL
ALBUMIN: 2.6 g/dL — AB (ref 3.5–5.2)
ALT: 57 U/L — ABNORMAL HIGH (ref 0–53)
AST: 20 U/L (ref 0–37)
Alkaline Phosphatase: 56 U/L (ref 39–117)
BILIRUBIN TOTAL: 0.3 mg/dL (ref 0.3–1.2)
BUN: 34 mg/dL — ABNORMAL HIGH (ref 6–23)
CHLORIDE: 99 meq/L (ref 96–112)
CO2: 21 mEq/L (ref 19–32)
CREATININE: 1.09 mg/dL (ref 0.50–1.35)
Calcium: 8.6 mg/dL (ref 8.4–10.5)
GFR calc Af Amer: 88 mL/min — ABNORMAL LOW (ref >90)
GFR calc non Af Amer: 76 mL/min — ABNORMAL LOW (ref >90)
Glucose, Bld: 138 mg/dL — ABNORMAL HIGH (ref 70–99)
Potassium: 4.6 mEq/L (ref 3.7–5.3)
Sodium: 135 mEq/L — ABNORMAL LOW (ref 137–147)
Total Protein: 6.6 g/dL (ref 6.0–8.3)

## 2013-10-11 LAB — TROPONIN I: Troponin I: <0.3 ng/mL (ref <0.30)

## 2013-10-11 LAB — LACTIC ACID, PLASMA: LACTIC ACID, VENOUS: 1.3 mmol/L (ref 0.5–2.2)

## 2013-10-11 MED ORDER — OXYCODONE-ACETAMINOPHEN 5-325 MG PO TABS: 1.0000 | ORAL_TABLET | Freq: Four times a day (QID) | ORAL | Status: DC | PRN

## 2013-10-11 MED ORDER — HEPARIN SODIUM (PORCINE) 5000 UNIT/ML IJ SOLN: 5000.0000 [IU] | Freq: Three times a day (TID) | INTRAMUSCULAR | Status: DC

## 2013-10-11 MED ORDER — APIXABAN 5 MG PO TABS
5.0000 mg | ORAL_TABLET | Freq: Two times a day (BID) | ORAL | Status: DC
  Filled 2013-10-11: qty 1

## 2013-10-11 MED ORDER — DICLOFENAC SODIUM 75 MG PO TBEC
75.0000 mg | DELAYED_RELEASE_TABLET | Freq: Two times a day (BID) | ORAL | Status: DC
  Administered 2013-10-11 – 2013-10-16 (×10): 75 mg via ORAL
  Filled 2013-10-11 (×11): qty 1

## 2013-10-11 MED ORDER — FOLIC ACID 1 MG PO TABS
1.0000 mg | ORAL_TABLET | Freq: Every evening | ORAL | Status: DC
  Administered 2013-10-12 – 2013-10-15 (×4): 1 mg via ORAL
  Filled 2013-10-11 (×5): qty 1

## 2013-10-11 MED ORDER — COLCHICINE 0.6 MG PO CAPS: 1.0000 | ORAL_CAPSULE | Freq: Two times a day (BID) | ORAL | Status: DC

## 2013-10-11 MED ORDER — INFLIXIMAB 100 MG IV SOLR: 100.0000 mg | INTRAVENOUS | Status: DC

## 2013-10-11 MED ORDER — DOFETILIDE 500 MCG PO CAPS
500.0000 ug | ORAL_CAPSULE | Freq: Two times a day (BID) | ORAL | Status: DC
  Administered 2013-10-11 – 2013-10-16 (×10): 500 ug via ORAL
  Filled 2013-10-11 (×11): qty 1

## 2013-10-11 MED ORDER — DICLOFENAC-MISOPROSTOL 75-0.2 MG PO TABS: 1.0000 | ORAL_TABLET | Freq: Two times a day (BID) | ORAL | Status: DC

## 2013-10-11 MED ORDER — METHYLPREDNISOLONE SODIUM SUCC 125 MG IJ SOLR
60.0000 mg | Freq: Once | INTRAMUSCULAR | Status: AC
  Administered 2013-10-11: 60 mg via INTRAVENOUS
  Filled 2013-10-11: qty 2

## 2013-10-11 MED ORDER — DICLOFENAC SODIUM 75 MG PO TBEC
75.0000 mg | DELAYED_RELEASE_TABLET | Freq: Two times a day (BID) | ORAL | Status: DC
  Filled 2013-10-11: qty 1

## 2013-10-11 MED ORDER — ONE-DAILY MULTI VITAMINS PO TABS: 1.0000 | ORAL_TABLET | Freq: Every morning | ORAL | Status: DC

## 2013-10-11 MED ORDER — ASPIRIN EC 81 MG PO TBEC
81.0000 mg | DELAYED_RELEASE_TABLET | Freq: Every day | ORAL | Status: DC
  Administered 2013-10-11 – 2013-10-16 (×6): 81 mg via ORAL
  Filled 2013-10-11 (×6): qty 1

## 2013-10-11 MED ORDER — SODIUM CHLORIDE 0.9 % IV BOLUS (SEPSIS)
500.0000 mL | Freq: Once | INTRAVENOUS | Status: AC
  Administered 2013-10-11: 500 mL via INTRAVENOUS

## 2013-10-11 MED ORDER — OMEGA-3-ACID ETHYL ESTERS 1 G PO CAPS
1.0000 g | ORAL_CAPSULE | Freq: Two times a day (BID) | ORAL | Status: DC
  Administered 2013-10-11 – 2013-10-16 (×10): 1 g via ORAL
  Filled 2013-10-11 (×11): qty 1

## 2013-10-11 MED ORDER — VITAMIN D3 25 MCG (1000 UNIT) PO TABS
1000.0000 [IU] | ORAL_TABLET | Freq: Every morning | ORAL | Status: DC
  Administered 2013-10-12 – 2013-10-16 (×5): 1000 [IU] via ORAL
  Filled 2013-10-11 (×5): qty 1

## 2013-10-11 MED ORDER — MISOPROSTOL 200 MCG PO TABS
200.0000 ug | ORAL_TABLET | Freq: Two times a day (BID) | ORAL | Status: DC
  Filled 2013-10-11: qty 1

## 2013-10-11 MED ORDER — COLCHICINE 0.6 MG PO TABS
0.6000 mg | ORAL_TABLET | Freq: Two times a day (BID) | ORAL | Status: DC
  Administered 2013-10-11 – 2013-10-16 (×10): 0.6 mg via ORAL
  Filled 2013-10-11 (×11): qty 1

## 2013-10-11 MED ORDER — NITROGLYCERIN 0.4 MG SL SUBL: 0.4000 mg | SUBLINGUAL_TABLET | SUBLINGUAL | Status: DC | PRN

## 2013-10-11 MED ORDER — PSYLLIUM 95 % PO PACK
1.0000 | PACK | Freq: Two times a day (BID) | ORAL | Status: DC
  Administered 2013-10-11 – 2013-10-16 (×10): 1 via ORAL
  Filled 2013-10-11 (×13): qty 1

## 2013-10-11 MED ORDER — PANTOPRAZOLE SODIUM 40 MG PO TBEC
40.0000 mg | DELAYED_RELEASE_TABLET | Freq: Two times a day (BID) | ORAL | Status: DC
  Administered 2013-10-12 – 2013-10-16 (×9): 40 mg via ORAL
  Filled 2013-10-11 (×9): qty 1

## 2013-10-11 MED ORDER — SODIUM CHLORIDE 0.9 % IV SOLN
INTRAVENOUS | Status: DC
  Administered 2013-10-11: 14:00:00 via INTRAVENOUS

## 2013-10-11 MED ORDER — MISOPROSTOL 200 MCG PO TABS
200.0000 ug | ORAL_TABLET | Freq: Two times a day (BID) | ORAL | Status: DC
  Administered 2013-10-11 – 2013-10-16 (×10): 200 ug via ORAL
  Filled 2013-10-11 (×11): qty 1

## 2013-10-11 MED ORDER — OXYCODONE-ACETAMINOPHEN 5-325 MG PO TABS
2.0000 | ORAL_TABLET | Freq: Four times a day (QID) | ORAL | Status: DC | PRN
  Administered 2013-10-11 – 2013-10-16 (×13): 2 via ORAL
  Filled 2013-10-11 (×22): qty 2

## 2013-10-11 MED ORDER — PREDNISONE 50 MG PO TABS
60.0000 mg | ORAL_TABLET | Freq: Every day | ORAL | Status: DC
  Administered 2013-10-12 – 2013-10-16 (×5): 60 mg via ORAL
  Filled 2013-10-11 (×6): qty 1

## 2013-10-11 MED ORDER — ADULT MULTIVITAMIN W/MINERALS CH
1.0000 | ORAL_TABLET | Freq: Every day | ORAL | Status: DC
  Administered 2013-10-12 – 2013-10-16 (×5): 1 via ORAL
  Filled 2013-10-11 (×5): qty 1

## 2013-10-11 MED ORDER — SODIUM CHLORIDE 0.9 % IV BOLUS (SEPSIS)
1000.0000 mL | Freq: Once | INTRAVENOUS | Status: AC
  Administered 2013-10-11: 1000 mL via INTRAVENOUS

## 2013-10-11 MED ORDER — PSYLLIUM HUSK POWD: 1.0000 | Freq: Two times a day (BID) | Status: DC

## 2013-10-11 MED ORDER — IBUPROFEN 800 MG PO TABS
800.0000 mg | ORAL_TABLET | Freq: Three times a day (TID) | ORAL | Status: DC
  Administered 2013-10-11: 800 mg via ORAL
  Filled 2013-10-11 (×4): qty 1

## 2013-10-11 MED ORDER — NIACIN ER (ANTIHYPERLIPIDEMIC) 500 MG PO TBCR
500.0000 mg | EXTENDED_RELEASE_TABLET | Freq: Every evening | ORAL | Status: DC
  Filled 2013-10-11: qty 1

## 2013-10-11 MED ORDER — ONDANSETRON HCL 4 MG/2ML IJ SOLN: 4.0000 mg | Freq: Four times a day (QID) | INTRAMUSCULAR | Status: DC | PRN

## 2013-10-11 MED ORDER — OMEGA-3 FATTY ACIDS 1000 MG PO CAPS: 1.0000 g | ORAL_CAPSULE | Freq: Two times a day (BID) | ORAL | Status: DC

## 2013-10-11 MED ORDER — SIMVASTATIN 5 MG PO TABS
5.0000 mg | ORAL_TABLET | Freq: Every day | ORAL | Status: DC
  Administered 2013-10-12 – 2013-10-15 (×4): 5 mg via ORAL
  Filled 2013-10-11 (×5): qty 1

## 2013-10-11 MED ORDER — NIACIN ER (ANTIHYPERLIPIDEMIC) 500 MG PO TBCR
500.0000 mg | EXTENDED_RELEASE_TABLET | Freq: Every evening | ORAL | Status: DC
  Administered 2013-10-12 – 2013-10-15 (×4): 500 mg via ORAL
  Filled 2013-10-11 (×6): qty 1

## 2013-10-11 MED ORDER — ACETAMINOPHEN 325 MG PO TABS: 650.0000 mg | ORAL_TABLET | ORAL | Status: DC | PRN

## 2013-10-11 MED ORDER — ASPIRIN 81 MG PO TABS: 81.0000 mg | ORAL_TABLET | Freq: Every day | ORAL | Status: DC

## 2013-10-11 MED ORDER — LORATADINE 10 MG PO TABS
10.0000 mg | ORAL_TABLET | Freq: Every day | ORAL | Status: DC
  Administered 2013-10-11 – 2013-10-16 (×4): 10 mg via ORAL
  Filled 2013-10-11 (×6): qty 1

## 2013-10-11 NOTE — ED Notes (Signed)
Spoke with IV team re: 2nd IV access

## 2013-10-11 NOTE — ED Notes (Addendum)
Cardiology Tenny Craw, MD at beside, IV team paged for second IV site, pt A&O x4, follows commands, speaks in complete sentences

## 2013-10-11 NOTE — H&P (Signed)
Primary Physician: Primary Cardiologist:  Bensimhon   HPI: pateint is a 53 yo who presents to ER with   Pateint has history of psoriatic arthritis, obesity, HTN, afib (s/p ablation with AF recurrence).  On Tikosyn.  Recently admitted from HF clinic with acute pericarditis.  Echo with small to mod pericardial effusion.  Also with afib with RVR.   Patient treated with IV dilt, indocin, colchicine.  Converted to SR  Put on po cardizem  Tikosyn continued  Also required steroids and pain meds.  Steroids tapered  Patient presents to ER today with CP and L neck/shoulder pain.  Got 324 ASA and 2 SL nitro.   Pain decreased but became hypotensive  The patient and his wife says that he still has had chest pain since at home.  Not as bad as prior to last admission. Today the patient's wife says that he turned white, lips bluish.  Felt very weak  EMS called The patient's wife says he has been in afib at home based on BP cuff reading.         Past Medical History  Diagnosis Date  . Paroxysmal atrial fibrillation     a. s/p DCCV 09/2007, 11/2007, 04/2013 --> flecainide and chronic coumadin.  . Morbid obesity   . Hypertension   . Psoriatic arthritis   . Cellulitis   . Atrial flutter     a. s/p DCCV 08/16/2012.  Marland Kitchen. NICM (nonischemic cardiomyopathy)     a. EF 45-50% in setting of AF in past; b. Neg MV;  c. 04/2010 Echo: EF 55-60%;  d. 11/2011 Cath: nl cors.     (Not in a hospital admission)      Infusions: . sodium chloride      Allergies  Allergen Reactions  . Lisinopril Other (See Comments)    cough    History   Social History  . Marital Status: Married    Spouse Name: N/A    Number of Children: N/A  . Years of Education: N/A   Occupational History  . Not on file.   Social History Main Topics  . Smoking status: Former Smoker    Types: Cigarettes    Quit date: 08/01/1990  . Smokeless tobacco: Not on file  . Alcohol Use: 1.8 - 2.4 oz/week    3-4 Glasses of wine per  week  . Drug Use: No  . Sexual Activity: Not on file   Other Topics Concern  . Not on file   Social History Narrative   He is married to his wife for 20 years.  He has one child.  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  He actually went to high school with [name] here in anesthesiology.      Family History  Problem Relation Age of Onset  . Heart disease Neg Hx     REVIEW OF SYSTEMS:  All systems reviewed  Negative to the above problem except as noted above.    PHYSICAL EXAM: Filed Vitals:   10/11/13 1245  BP: 107/89  Pulse: 93  Temp:   Resp: 21    No intake or output data in the 24 hours ending 10/11/13 1357  General:  Patient is a morbidly obese 53 yo in nAD HEENT: normal Neck: supple. no JVD. Carotids 2+ bilat; no bruits. No lymphadenopathy or thryomegaly appreciated. Cor: PMI nondisplaced. Regular rate & rhythm. No rubs, gallops or murmurs. Lungs: clear Abdomen: soft, nontender, nondistended. No hepatosplenomegaly. No bruits or masses. Good  bowel sounds. Extremities: no cyanosis, clubbing, rash, edema Neuro: alert & oriented x 3, cranial nerves grossly intact. moves all 4 extremities w/o difficulty. Affect pleasant.  ECG:  SR 95  QTc 460.    Results for orders placed during the hospital encounter of 10/11/13 (from the past 24 hour(s))  CBC WITH DIFFERENTIAL     Status: None   Collection Time    10/11/13  1:05 PM      Result Value Ref Range   WBC 8.3  4.0 - 10.5 K/uL   RBC 4.62  4.22 - 5.81 MIL/uL   Hemoglobin 13.9  13.0 - 17.0 g/dL   HCT 95.0  72.2 - 57.5 %   MCV 88.5  78.0 - 100.0 fL   MCH 30.1  26.0 - 34.0 pg   MCHC 34.0  30.0 - 36.0 g/dL   RDW 05.1  83.3 - 58.2 %   Platelets 243  150 - 400 K/uL   Neutrophils Relative % 73  43 - 77 %   Neutro Abs 6.1  1.7 - 7.7 K/uL   Lymphocytes Relative 18  12 - 46 %   Lymphs Abs 1.5  0.7 - 4.0 K/uL   Monocytes Relative 9  3 - 12 %   Monocytes Absolute 0.7  0.1 - 1.0 K/uL   Eosinophils Relative 1   0 - 5 %   Eosinophils Absolute 0.1  0.0 - 0.7 K/uL   Basophils Relative 0  0 - 1 %   Basophils Absolute 0.0  0.0 - 0.1 K/uL   Dg Chest 1 View  10/11/2013   CLINICAL DATA:  Difficulty breathing  EXAM: CHEST - 1 VIEW  COMPARISON:  October 02, 2013  FINDINGS: The aorta is tortuous. The heart size markedly enlarged. There is central pulmonary vascular congestion. Mild patchy opacity of left lung base is noted. There is no pleural effusion. The visualized skeletal structures are stable.  IMPRESSION: Cardiomegaly with mild central pulmonary vascular congestion. Patchy consolidation of left lung base suspicious for pneumonia.   Electronically Signed   By: Sherian Rein M.D.   On: 10/11/2013 13:27     ASSESSMENT:  Patient is a 53 yo recently admitted with pericarditis.  Sent home on colchicine and prednisone taper.  Returns today feeling bad. COntinued CP though not as bad as prior to last admit.  Wife said transiently turned white On arrival to ER BP was 110 to 120s  Got 2 sl NTG with drop into 70s  It has responded to fluids and is now over 110  I have reviewed echoes  There is some signs of filling compromise (mitral inflow variation, some blunting of IVC with inspiration)  No frank collapse of RV.  I have discussed with D Bensimhon who took care of patient earlier this week. I would recommend watching  If he does need fluid removal would best be done surgically WIll resume steroids.  Narcotics for pain. Continue colchicine and NSAID.   2.  PAF  Continue Tikosyn and Eliquis  3.  Hx NICM  LVEF has normalized  4.  HTN  Follow BP  5  Psoriatic arthritis  Continue meds.  Not clear that effusion related.

## 2013-10-11 NOTE — ED Notes (Signed)
Cardiology admitting paged to update on BP

## 2013-10-11 NOTE — ED Notes (Signed)
Patient transported to X-ray 

## 2013-10-11 NOTE — ED Notes (Signed)
Phlebotomy at bedside.

## 2013-10-11 NOTE — ED Notes (Signed)
Echocardiogram being completed at bedside  

## 2013-10-11 NOTE — ED Notes (Signed)
Pt hypotensive, Freida Busman, MD at bedside, pt on cardiac monitor, started NS bolus per verbal order, pt appears clammy, pt placed on 2 L [place]

## 2013-10-11 NOTE — ED Notes (Signed)
Pt in from [facility] from home, pt c/o radiating CP to L neck & shoulder, pt seen earlier this week for pericarditis, pt rcvd 324 ASA in route with x 2 SL  Nitro with relief from 4/10 to 1 /10, pt A&O x4, follows commands, speaks in complete sentences, denies N/V/D

## 2013-10-11 NOTE — Progress Notes (Signed)
  Echocardiogram 2D Echocardiogram has been performed.  Georgian Co 10/11/2013, 2:53 PM

## 2013-10-11 NOTE — ED Provider Notes (Signed)
CSN: [id number]     Arrival date & time 10/11/13  1233 History   First MD Initiated Contact with Patient 10/11/13 1253     Chief Complaint  Patient presents with  . Chest Pain     (Consider location/radiation/quality/duration/timing/severity/associated sxs/prior Treatment) Patient is a 53 y.o. male presenting with chest pain. The history is provided by the patient and the spouse. No language interpreter was used.  Chest Pain Associated symptoms: diaphoresis, fatigue and shortness of breath   Associated symptoms: no abdominal pain, no back pain, no cough, no fever, no nausea, no palpitations and not vomiting   This is a 52yo WM with PMH psoriatic arthritis, obesity, A fib on tikosyn and Eliquis, HTN who presents with SOB, lightheadedness, CP x this morning. Patient was admitted for treatment of acute pericarditis 3/3-3/6, echo at that time revealed small pericardial effusion. He was discharged on colchicine and steroid taper, which he has been compliant with. Per wife, patient woke up around 5AM with continued and worsening chest pain that radiated into his shoulders and neck associated with SOB, lightheadedness, diaphoresis. Patient currently has no chest pain, but is having increasing SOB and lightheadedness. Denies nausea, back pain, cough.   Past Medical History  Diagnosis Date  . Paroxysmal atrial fibrillation     a. s/p DCCV 09/2007, 11/2007, 04/2013 --> flecainide and chronic coumadin.  . Morbid obesity   . Hypertension   . Psoriatic arthritis   . Cellulitis   . Atrial flutter     a. s/p DCCV 08/16/2012.  Marland Kitchen. NICM (nonischemic cardiomyopathy)     a. EF 45-50% in setting of AF in past; b. Neg MV;  c. 04/2010 Echo: EF 55-60%;  d. 11/2011 Cath: nl cors.   Past Surgical History  Procedure Laterality Date  . Ablation    . Cardioversion    . Appendectomy    . Foot surgery    . Cardioversion  08/16/2012    Procedure: CARDIOVERSION;  Surgeon: Dolores Pattyaniel R Bensimhon, MD;  [facility];   Service: Cardiovascular;  Laterality: N/A;  . Cardioversion N/A 06/05/2013    Procedure: CARDIOVERSION;  Surgeon: Dolores Pattyaniel R Bensimhon, MD;  [facility];  Service: Cardiovascular;  Laterality: N/A;   Family History  Problem Relation Age of Onset  . Heart disease Neg Hx    History  Substance Use Topics  . Smoking status: Former Smoker    Types: Cigarettes    Quit date: 08/01/1990  . Smokeless tobacco: Not on file  . Alcohol Use: 1.8 - 2.4 oz/week    3-4 Glasses of wine per week    Review of Systems  Constitutional: Positive for diaphoresis and fatigue. Negative for fever.  Respiratory: Positive for shortness of breath. Negative for cough.   Cardiovascular: Positive for chest pain. Negative for palpitations and leg swelling.  Gastrointestinal: Negative for nausea, vomiting, abdominal pain and diarrhea.  Genitourinary: Negative for dysuria.  Musculoskeletal: Negative for back pain.  Neurological: Positive for light-headedness. Negative for syncope.  All other systems reviewed and are negative.      Allergies  Lisinopril  Home Medications   Current Outpatient Rx  Name  Route  Sig  Dispense  Refill  . apixaban (ELIQUIS) 5 MG TABS tablet   Oral   Take 1 tablet (5 mg total) by mouth 2 (two) times daily.   60 tablet   6   . aspirin 81 MG tablet   Oral   Take 81 mg by mouth at bedtime.          .Marland Kitchen  cetirizine (ZYRTEC) 10 MG tablet   Oral   Take 10 mg by mouth as directed. TAKE 1 TABLET X 3 DAYS BEFORE THE REMACAID INJECTION         . cholecalciferol (VITAMIN D) 1000 UNITS tablet   Oral   Take 1,000 Units by mouth every morning.          . Colchicine 0.6 MG CAPS   Oral   Take 1 capsule by mouth 2 (two) times daily.   60 capsule   6   . diclofenac-misoprostol (ARTHROTEC 75) 75-0.2 MG per tablet   Oral   Take 1 tablet by mouth 2 (two) times daily.           Marland Kitchen diltiazem (CARDIZEM CD) 180 MG 24 hr capsule   Oral   Take 1 capsule (180 mg total) by mouth  every morning.   30 capsule   6   . dofetilide (TIKOSYN) 500 MCG capsule   Oral   Take 1 capsule (500 mcg total) by mouth every 12 (twelve) hours.   60 capsule   6   . fish oil-omega-3 fatty acids 1000 MG capsule   Oral   Take 1 g by mouth 2 (two) times daily.          . folic acid (FOLVITE) 1 MG tablet   Oral   Take 1 mg by mouth every evening.          . furosemide (LASIX) 20 MG tablet   Oral   Take 1 tablet (20 mg total) by mouth as needed. Takes in the morning   30 tablet   6   . ibuprofen (ADVIL,MOTRIN) 800 MG tablet   Oral   Take 1 tablet (800 mg total) by mouth 3 (three) times daily.   90 tablet   1   . inFLIXimab (REMICADE) 100 MG injection   Intravenous   Inject 100 mg into the vein every 30 (thirty) days.         Marland Kitchen lovastatin (MEVACOR) 20 MG tablet      take 1 tablet by mouth at bedtime   30 tablet   7   . MAGNESIUM PO   Oral   Take 1 tablet by mouth daily.         Marland Kitchen METHOTREXATE SODIUM IJ   Injection   Inject 0.6 mLs as directed every 7 (seven) days. Sundays in evenings         . Multiple Vitamin (MULTIVITAMIN) tablet   Oral   Take 1 tablet by mouth every morning.          . niacin (NIASPAN) 500 MG CR tablet   Oral   Take 500 mg by mouth every evening.         Marland Kitchen oxyCODONE-acetaminophen (PERCOCET/ROXICET) 5-325 MG per tablet               . pantoprazole (PROTONIX) 40 MG tablet   Oral   Take 1 tablet (40 mg total) by mouth 2 (two) times daily before a meal.   60 tablet   6   . potassium chloride SA (K-DUR,KLOR-CON) 20 MEQ tablet   Oral   Take 1 tablet (20 mEq total) by mouth as needed. Take 20 meq when you take lasix   30 tablet   6   . predniSONE (DELTASONE) 20 MG tablet      Take 40 mg for 2 days then take 30 mg for 2 days, then take 20 mg for 2 days  then take 10 mg for 2 days.   20 tablet   0   . Psyllium Husk POWD   Oral   Take 1 Package by mouth 2 (two) times daily. For bowel regularity         .  valsartan (DIOVAN) 80 MG tablet   Oral   Take 80 mg by mouth daily.          BP 107/89  Pulse 93  Temp(Src) 98.8 F (37.1 C) (Oral)  Resp 21  SpO2 97% Physical Exam  Nursing note and vitals reviewed. Constitutional: He is oriented to person, place, and time. He appears well-developed and well-nourished. He appears distressed.  HENT:  Head: Normocephalic and atraumatic.  Dry mucous membranes  Eyes: Conjunctivae are normal. Pupils are equal, round, and reactive to light.  Neck:  Unable to visualize JVD 2/2 body habitus  Cardiovascular: Normal rate, regular rhythm and normal heart sounds.  Exam reveals no gallop and no friction rub.   No murmur heard. Pulmonary/Chest: Effort normal and breath sounds normal.  Breathing rapidly  Abdominal: Soft. Bowel sounds are normal. He exhibits no distension. There is no tenderness. There is no rebound.  Musculoskeletal: He exhibits no edema.  Neurological: He is alert and oriented to person, place, and time. No cranial nerve deficit.  Skin: Skin is warm. He is not diaphoretic. There is pallor.    ED Course  Procedures (including critical care time) Labs Review Labs Reviewed  CULTURE, BLOOD (ROUTINE X 2)  CULTURE, BLOOD (ROUTINE X 2)  CBC WITH DIFFERENTIAL  COMPREHENSIVE METABOLIC PANEL  TROPONIN I  LACTIC ACID, PLASMA  PRO B NATRIURETIC PEPTIDE   Imaging Review Dg Chest 1 View  10/11/2013   CLINICAL DATA:  Difficulty breathing  EXAM: CHEST - 1 VIEW  COMPARISON:  October 02, 2013  FINDINGS: The aorta is tortuous. The heart size markedly enlarged. There is central pulmonary vascular congestion. Mild patchy opacity of left lung base is noted. There is no pleural effusion. The visualized skeletal structures are stable.  IMPRESSION: Cardiomegaly with mild central pulmonary vascular congestion. Patchy consolidation of left lung base suspicious for pneumonia.   Electronically Signed   By: Sherian Rein M.D.   On: 10/11/2013 13:27     EKG  Interpretation   Date/Time:  Friday October 11 2013 12:36:50 EDT Ventricular Rate:  96 PR Interval:  158 QRS Duration: 96 QT Interval:  363 QTC Calculation: 459 R Axis:   46 Text Interpretation:  Sinus rhythm Low voltage, precordial leads Confirmed  by ALLEN  MD, ANTHONY ([id number]) on 10/11/2013 12:57:02 PM      MDM   This is a 52yo WM with PMH obesity, HTN, recently admitted for acute pericarditis who presents with SOB, lightheadedness and hypotension. Patient's blood pressure upon initial presentation was 107/89. However, his blood pressure dropped to 70s/40. IVF running now and obtaining second IV. CXR shows cardiomegaly and consolidation to L lung base, ? PNA. Called Echo lab for stat echo to assess for tamponade. CBC, CMP, troponin, BCx, lactate, pro-BNP pending.   2:15 PM Patient is mentating well, though still SOB and lightheaded. Blood pressure remains soft, 80s/40s. IV team called for second IV placement. Echo team in the room now. Dr. Tenny Craw with cardiology has evaluated patient and will follow. CBC and CMP unremarkable. Trop negative x 1. Cardiology will admit patient.  Windell Hummingbird, MD 10/11/13 [phone]

## 2013-10-11 NOTE — ED Provider Notes (Signed)
I saw and evaluated the patient, reviewed the resident's note and I agree with the findings and plan.   EKG Interpretation   Date/Time:  Friday October 11 2013 12:36:50 EDT Ventricular Rate:  96 PR Interval:  158 QRS Duration: 96 QT Interval:  363 QTC Calculation: 459 R Axis:   46 Text Interpretation:  Sinus rhythm Low voltage, precordial leads Confirmed  by Shaneika Rossa  MD, Shlomie Romig ([id number]) on 10/11/2013 12:57:02 PM     Patient here with chest pain and shortness of breath. Recently diagnosed with pericarditis along with a pericardial effusion. Chest pain was relieved after 2 nitroglycerin by EMS. Patient was found to be hypotensive here and was given IV fluid resuscitation. He is able to maintain his airway at this time. Urgent cardiology consultation obtained. Will order stat 2-D echocardiogram To Rule Out Pericardial tamponande. Care the patient was discussed with his family as well as old records reviewed. Patient's blood pressure reassessed multiple times and initially had a systolic of 70 and improved to a systolic of 90. Patient to be admitted by cardiology   CRITICAL CARE Performed by: Toy Baker Total critical care time: 50 Critical care time was exclusive of separately billable procedures and treating other patients. Critical care was necessary to treat or prevent imminent or life-threatening deterioration. Critical care was time spent personally by me on the following activities: development of treatment plan with patient and/or surrogate as well as nursing, discussions with consultants, evaluation of patient's response to treatment, examination of patient, obtaining history from patient or surrogate, ordering and performing treatments and interventions, ordering and review of laboratory studies, ordering and review of radiographic studies, pulse oximetry and re-evaluation of patient's condition.   Toy Baker, MD 10/11/13 [phone]

## 2013-10-12 LAB — CBC
HEMATOCRIT: 35.8 % — AB (ref 39.0–52.0)
HEMOGLOBIN: 12.1 g/dL — AB (ref 13.0–17.0)
MCH: 30 pg (ref 26.0–34.0)
MCHC: 33.8 g/dL (ref 30.0–36.0)
MCV: 88.8 fL (ref 78.0–100.0)
Platelets: 211 10*3/uL (ref 150–400)
RBC: 4.03 MIL/uL — ABNORMAL LOW (ref 4.22–5.81)
RDW: 13.9 % (ref 11.5–15.5)
WBC: 6.6 10*3/uL (ref 4.0–10.5)

## 2013-10-12 LAB — BASIC METABOLIC PANEL
BUN: 23 mg/dL (ref 6–23)
CALCIUM: 8.4 mg/dL (ref 8.4–10.5)
CO2: 21 mEq/L (ref 19–32)
Chloride: 99 mEq/L (ref 96–112)
Creatinine, Ser: 0.66 mg/dL (ref 0.50–1.35)
GFR calc Af Amer: >90 mL/min (ref >90)
GLUCOSE: 156 mg/dL — AB (ref 70–99)
Potassium: 4.5 mEq/L (ref 3.7–5.3)
Sodium: 134 mEq/L — ABNORMAL LOW (ref 137–147)

## 2013-10-12 LAB — PRO B NATRIURETIC PEPTIDE: Pro B Natriuretic peptide (BNP): 602.2 pg/mL — ABNORMAL HIGH (ref 0–125)

## 2013-10-12 MED ORDER — ENOXAPARIN SODIUM 80 MG/0.8ML ~~LOC~~ SOLN
80.0000 mg | SUBCUTANEOUS | Status: DC
  Administered 2013-10-12 – 2013-10-15 (×4): 80 mg via SUBCUTANEOUS
  Filled 2013-10-12 (×5): qty 0.8

## 2013-10-12 NOTE — Progress Notes (Signed)
ANTICOAGULATION CONSULT NOTE - Initial Consult  Pharmacy Consult for Lovenox Indication: VTE prophylaxis  Allergies  Allergen Reactions  . Lisinopril Other (See Comments)    cough    Patient Measurements: Height: 5\' 10"  (177.8 cm) Weight: 384 lb 0.7 oz (174.2 kg) IBW/kg (Calculated) : 73 Heparin Dosing Weight:    Vital Signs: Temp: 98.3 F (36.8 C) (03/14 0800) Temp src: Oral (03/14 0800) BP: 129/75 mmHg (03/14 0800) Pulse Rate: 61 (03/14 0300)  Labs:  Recent Labs  10/11/13 1305 10/12/13 0343  HGB 13.9 12.1*  HCT 40.9 35.8*  PLT 243 211  CREATININE 1.09 0.66  TROPONINI <0.30  --     Estimated Creatinine Clearance: 171.4 ml/min (by C-G formula based on Cr of 0.66).   Medical History: Past Medical History  Diagnosis Date  . Paroxysmal atrial fibrillation     a. s/p DCCV 09/2007, 11/2007, 04/2013 --> flecainide and chronic coumadin.  . Morbid obesity   . Hypertension   . Psoriatic arthritis   . Cellulitis   . Atrial flutter     a. s/p DCCV 08/16/2012.  Marland Kitchen NICM (nonischemic cardiomyopathy)     a. EF 45-50% in setting of AF in past; b. Neg MV;  c. 04/2010 Echo: EF 55-60%;  d. 11/2011 Cath: nl cors.    Medications:  Prescriptions prior to admission  Medication Sig Dispense Refill  . apixaban (ELIQUIS) 5 MG TABS tablet Take 1 tablet (5 mg total) by mouth 2 (two) times daily.  60 tablet  6  . aspirin 81 MG tablet Take 81 mg by mouth at bedtime.       . cetirizine (ZYRTEC) 10 MG tablet Take 10 mg by mouth as directed. TAKE 1 TABLET X 3 DAYS BEFORE THE REMACAID INJECTION      . cholecalciferol (VITAMIN D) 1000 UNITS tablet Take 1,000 Units by mouth every morning.       . Colchicine 0.6 MG CAPS Take 1 capsule by mouth 2 (two) times daily.  60 capsule  6  . diclofenac-misoprostol (ARTHROTEC 75) 75-0.2 MG per tablet Take 1 tablet by mouth 2 (two) times daily.        Marland Kitchen diltiazem (CARDIZEM CD) 180 MG 24 hr capsule Take 1 capsule (180 mg total) by mouth every morning.  30  capsule  6  . dofetilide (TIKOSYN) 500 MCG capsule Take 1 capsule (500 mcg total) by mouth every 12 (twelve) hours.  60 capsule  6  . fish oil-omega-3 fatty acids 1000 MG capsule Take 1 g by mouth 2 (two) times daily.       . folic acid (FOLVITE) 1 MG tablet Take 1 mg by mouth every evening.       . furosemide (LASIX) 20 MG tablet Take 1 tablet (20 mg total) by mouth as needed. Takes in the morning  30 tablet  6  . ibuprofen (ADVIL,MOTRIN) 800 MG tablet Take 1 tablet (800 mg total) by mouth 3 (three) times daily.  90 tablet  1  . inFLIXimab (REMICADE) 100 MG injection Inject 100 mg into the vein every 30 (thirty) days.      Marland Kitchen lovastatin (MEVACOR) 20 MG tablet take 1 tablet by mouth at bedtime  30 tablet  7  . MAGNESIUM PO Take 1 tablet by mouth daily.      Marland Kitchen METHOTREXATE SODIUM IJ Inject 0.6 mLs as directed every 7 (seven) days. Sundays in evenings      . Multiple Vitamin (MULTIVITAMIN) tablet Take 1 tablet by mouth every  morning.       . niacin (NIASPAN) 500 MG CR tablet Take 500 mg by mouth every evening.      Marland Kitchen. oxyCODONE-acetaminophen (PERCOCET/ROXICET) 5-325 MG per tablet Take 1 tablet by mouth every 6 (six) hours as needed for severe pain.       . pantoprazole (PROTONIX) 40 MG tablet Take 1 tablet (40 mg total) by mouth 2 (two) times daily before a meal.  60 tablet  6  . potassium chloride SA (K-DUR,KLOR-CON) 20 MEQ tablet Take 1 tablet (20 mEq total) by mouth as needed. Take 20 meq when you take lasix  30 tablet  6  . predniSONE (DELTASONE) 20 MG tablet Take 40 mg for 2 days then take 30 mg for 2 days, then take 20 mg for 2 days then take 10 mg for 2 days.  20 tablet  0  . Psyllium Husk POWD Take 1 Package by mouth 2 (two) times daily. For bowel regularity      . valsartan (DIOVAN) 80 MG tablet Take 80 mg by mouth daily.        Assessment: [age] y/o male with morbid obesity, psoriatic arthritis, PAF on Tikosyn s/p ablation. Admitted 3/13 with recurrent pericarditis and pericardial effusion.  Effusion may have to be drained surgically. Eliquis on hold. Last dose noted 3/13. Start LMWH for DVT prophylaxis.  Labs: Scr 0.66 with CrCl>100. proBNP 602, Hgb 12.1, Plts 211  Cards: Afib. VSS. Meds: ASA81, Tikosyn, Niacin, Lovaza, Zocor Tikosyn: K=4.5, Mag ?, Qtc 459  Heme/Onc: psoriatic arthritis on Diclfenac, Remicade, misoprostol, prednisone   Goal of Therapy:  DVT prophylaxis Monitor platelets by anticoagulation protocol: Yes   Plan:  Lovenox 80mg  SQ q24h (approx 0.5mg /kg/day) CBC q72h on LMWH  Tai Syfert S. Merilynn Finlandobertson, PharmD, [facility] Pa[facility]PS Clinical Staff Pharmacist Pager [phone]  Misty Stanleyobertson, Antwone Capozzoli Stillinger 10/12/2013,10:58 AM

## 2013-10-12 NOTE — Progress Notes (Signed)
Advanced Heart Failure Rounding Note   Subjective:     53 y/o male with morbid obesity, psoriatic arthritis, PAF on Tikosyn s/p ablation. Admitted 3/13 with recurrent pericarditis.  Echo reviewed with Dr. Tenny Crawoss. Effusion is slightly bigger (moderate) with some variation in MV inflow but no overt tamponade. Steroids restarted.    Objective:   Weight Range:  Vital Signs:   Temp:  [97.8 F (36.6 C)-98.8 F (37.1 C)] 98.3 F (36.8 C) (03/14 0800) Pulse Rate:  [58-95] 61 (03/14 0300) Resp:  [14-26] 14 (03/14 0800) BP: (69-129)/(40-99) 129/75 mmHg (03/14 0800) SpO2:  [95 %-99 %] 95 % (03/14 0800) Weight:  [174.1 kg (383 lb 13.1 oz)-174.2 kg (384 lb 0.7 oz)] 174.2 kg (384 lb 0.7 oz) (03/14 0300)    Weight change: Filed Weights   10/11/13 2054 10/12/13 0300  Weight: 174.1 kg (383 lb 13.1 oz) 174.2 kg (384 lb 0.7 oz)    Intake/Output:   Intake/Output Summary (Last 24 hours) at 10/12/13 0936 Last data filed at 10/12/13 0500  Gross per 24 hour  Intake   1500 ml  Output   1850 ml  Net   -350 ml     Physical Exam: General:  Well appearing. No resp difficulty HEENT: normal Neck: supple. JVP - unable to assess well. Carotids 2+ bilat; no bruits. No lymphadenopathy or thryomegaly appreciated. Cor: PMI nondisplaced. Regular rate & rhythm. No rubs, gallops or murmurs. Lungs: clear Abdomen: obese soft, nontender, nondistended. Good bowel sounds. Extremities: no cyanosis, clubbing, rash, edema Neuro: alert & orientedx3, cranial nerves grossly intact. moves all 4 extremities w/o difficulty. Affect pleasant  Telemetry: SR  Labs: Basic Metabolic Panel:  Recent Labs Lab 10/11/13 1305 10/12/13 0343  NA 135* 134*  K 4.6 4.5  CL 99 99  CO2 21 21  GLUCOSE 138* 156*  BUN 34* 23  CREATININE 1.09 0.66  CALCIUM 8.6 8.4    Liver Function Tests:  Recent Labs Lab 10/11/13 1305  AST 20  ALT 57*  ALKPHOS 56  BILITOT 0.3  PROT 6.6  ALBUMIN 2.6*   No results found for this  basename: LIPASE, AMYLASE,  in the last 168 hours No results found for this basename: AMMONIA,  in the last 168 hours  CBC:  Recent Labs Lab 10/11/13 1305 10/12/13 0343  WBC 8.3 6.6  NEUTROABS 6.1  --   HGB 13.9 12.1*  HCT 40.9 35.8*  MCV 88.5 88.8  PLT 243 211    Cardiac Enzymes:  Recent Labs Lab 10/11/13 1305  TROPONINI <0.30    BNP: BNP (last 3 results)  Recent Labs  10/11/13 2230  PROBNP 602.2*     Other results:  EKG:   Imaging: Dg Chest 1 View  10/11/2013   CLINICAL DATA:  Difficulty breathing  EXAM: CHEST - 1 VIEW  COMPARISON:  October 02, 2013  FINDINGS: The aorta is tortuous. The heart size markedly enlarged. There is central pulmonary vascular congestion. Mild patchy opacity of left lung base is noted. There is no pleural effusion. The visualized skeletal structures are stable.  IMPRESSION: Cardiomegaly with mild central pulmonary vascular congestion. Patchy consolidation of left lung base suspicious for pneumonia.   Electronically Signed   By: Sherian ReinWei-Chen  Lin M.D.   On: 10/11/2013 13:27      Medications:     Scheduled Medications: . aspirin EC  81 mg Oral Daily  . cholecalciferol  1,000 Units Oral q morning - 10a  . colchicine  0.6 mg Oral BID  .  misoprostol  200 mcg Oral BID   And  . diclofenac  75 mg Oral BID  . dofetilide  500 mcg Oral Q12H  . folic acid  1 mg Oral QPM  . ibuprofen  800 mg Oral TID  . [START ON 11/11/2013] inFLIXimab  100 mg Intravenous Q30 days  . loratadine  10 mg Oral Daily  . multivitamin with minerals  1 tablet Oral Daily  . niacin  500 mg Oral QPM  . omega-3 acid ethyl esters  1 g Oral BID  . pantoprazole  40 mg Oral BID AC  . predniSONE  60 mg Oral Q breakfast  . psyllium  1 packet Oral BID  . simvastatin  5 mg Oral q1800     Infusions: . sodium chloride 10 mL/hr at 10/11/13 1400     PRN Medications:  acetaminophen, nitroGLYCERIN, ondansetron (ZOFRAN) IV, oxyCODONE-acetaminophen   Assessment:   1. Acute  pericarditis with moderate pericardial effusion 2. PAF  3. Psoriatic arthritis 4. Morbid obesity  Plan/Discussion:    Improved overnight. Given size I am very hesitant to try and drain effusion and would likely have to be done surgically. In setting of psoriatic arthritis will continue high-dose steroids although ANA just 1:160 suggesting this probably isn't related to psoriasis flare. Continue colchicine. Repeat echo early next week.   Eliquis has been placed on hold for now due to risk of bleeding. Will attempt to restart soon. Pharmacy to dose DVT dose lovenox.  Continue Tikosyn for AF    Length of Stay: 1   Holly Bodily 10/12/2013, 9:36 AM  Advanced Heart Failure Team Pager [phone] (M-F; 7a - 4p)  Please contact [facility] for night-coverage after hours (4p -7a ) and weekends on [email or web address]

## 2013-10-12 NOTE — Progress Notes (Signed)
Ambulating to BR w/steady gait(independently).Denies SOB,chest pain  and pressure .a constant "tightness is still present("2" on 1 - 10 scale) ,horizontally across upper chest to upper back which increases slightly when he rises out of chair or sits back down .

## 2013-10-12 NOTE — Progress Notes (Signed)
Utilization review completed.  P.J. Laranda Burkemper,RN,BSN Case Manager [phone]  

## 2013-10-13 NOTE — Progress Notes (Signed)
Advanced Heart Failure Rounding Note   Subjective:     53 y/o male with morbid obesity, psoriatic arthritis, PAF on Tikosyn s/p ablation. Admitted 3/13 with recurrent pericarditis.  Feels ok. Can take bigger breath but still with CP. On steroids. Diltiazem, eliquis and lasix on hold. HR 50s   Objective:   Weight Range:  Vital Signs:   Temp:  [97.5 F (36.4 C)-98.3 F (36.8 C)] 97.9 F (36.6 C) (03/15 1155) Pulse Rate:  [48-62] 48 (03/15 0455) Resp:  [16-20] 20 (03/15 0455) BP: (105-162)/(52-87) 133/80 mmHg (03/15 1155) SpO2:  [96 %-99 %] 96 % (03/15 1155) Weight:  [173.546 kg (382 lb 9.6 oz)] 173.546 kg (382 lb 9.6 oz) (03/15 0451) Last BM Date: 10/12/13  Weight change: Filed Weights   10/11/13 2054 10/12/13 0300 10/13/13 0451  Weight: 174.1 kg (383 lb 13.1 oz) 174.2 kg (384 lb 0.7 oz) 173.546 kg (382 lb 9.6 oz)    Intake/Output:   Intake/Output Summary (Last 24 hours) at 10/13/13 1159 Last data filed at 10/13/13 1130  Gross per 24 hour  Intake   1260 ml  Output   5000 ml  Net  -3740 ml     Physical Exam: General:  Well appearing. No resp difficulty HEENT: normal Neck: supple. JVP hard to see probably about 7. No kussmauls. Carotids 2+ bilat; no bruits. No lymphadenopathy or thryomegaly appreciated. Cor: PMI nondisplaced. Regular rate & rhythm. No rubs, gallops or murmurs. Lungs: clear Abdomen: obese soft, nontender, nondistended. Good bowel sounds. Extremities: no cyanosis, clubbing, rash, edema Neuro: alert & orientedx3, cranial nerves grossly intact. moves all 4 extremities w/o difficulty. Affect pleasant  Telemetry: SR  Labs: Basic Metabolic Panel:  Recent Labs Lab 10/11/13 1305 10/12/13 0343  NA 135* 134*  K 4.6 4.5  CL 99 99  CO2 21 21  GLUCOSE 138* 156*  BUN 34* 23  CREATININE 1.09 0.66  CALCIUM 8.6 8.4    Liver Function Tests:  Recent Labs Lab 10/11/13 1305  AST 20  ALT 57*  ALKPHOS 56  BILITOT 0.3  PROT 6.6  ALBUMIN 2.6*   No  results found for this basename: LIPASE, AMYLASE,  in the last 168 hours No results found for this basename: AMMONIA,  in the last 168 hours  CBC:  Recent Labs Lab 10/11/13 1305 10/12/13 0343  WBC 8.3 6.6  NEUTROABS 6.1  --   HGB 13.9 12.1*  HCT 40.9 35.8*  MCV 88.5 88.8  PLT 243 211    Cardiac Enzymes:  Recent Labs Lab 10/11/13 1305  TROPONINI <0.30    BNP: BNP (last 3 results)  Recent Labs  10/11/13 2230  PROBNP 602.2*     Other results:  EKG:   Imaging: Dg Chest 1 View  10/11/2013   CLINICAL DATA:  Difficulty breathing  EXAM: CHEST - 1 VIEW  COMPARISON:  October 02, 2013  FINDINGS: The aorta is tortuous. The heart size markedly enlarged. There is central pulmonary vascular congestion. Mild patchy opacity of left lung base is noted. There is no pleural effusion. The visualized skeletal structures are stable.  IMPRESSION: Cardiomegaly with mild central pulmonary vascular congestion. Patchy consolidation of left lung base suspicious for pneumonia.   Electronically Signed   By: Sherian Rein M.D.   On: 10/11/2013 13:27     Medications:     Scheduled Medications: . aspirin EC  81 mg Oral Daily  . cholecalciferol  1,000 Units Oral q morning - 10a  . colchicine  0.6 mg Oral  BID  . misoprostol  200 mcg Oral BID   And  . diclofenac  75 mg Oral BID  . dofetilide  500 mcg Oral Q12H  . enoxaparin (LOVENOX) injection  80 mg Subcutaneous Q24H  . folic acid  1 mg Oral QPM  . [START ON 11/11/2013] inFLIXimab  100 mg Intravenous Q30 days  . loratadine  10 mg Oral Daily  . multivitamin with minerals  1 tablet Oral Daily  . niacin  500 mg Oral QPM  . omega-3 acid ethyl esters  1 g Oral BID  . pantoprazole  40 mg Oral BID AC  . predniSONE  60 mg Oral Q breakfast  . psyllium  1 packet Oral BID  . simvastatin  5 mg Oral q1800    Infusions: . sodium chloride 10 mL/hr at 10/11/13 1400    PRN Medications: acetaminophen, nitroGLYCERIN, ondansetron (ZOFRAN) IV,  oxyCODONE-acetaminophen   Assessment:   1. Acute pericarditis with moderate pericardial effusion 2. PAF  3. Psoriatic arthritis 4. Morbid obesity  Plan/Discussion:    Stable. Slow HR is reassuring.  iven size I am very hesitant to try and drain effusion and would likely have to be done surgically. In setting of psoriatic arthritis will continue high-dose steroids although ANA just 1:160 suggesting this probably isn't related to psoriasis flare. Continue colchicine. Repeat echo and CXR Tuesday. I am hopeful this will resolve with time and steroids.  Eliquis has been placed on hold for now due to risk of bleeding. Will attempt to restart soon. Pharmacy dosing DVT dose lovenox.  Continue Tikosyn for AF. Can restart diltiazem as needed.   Length of Stay: 2   Holly BodilyDaniel BensimhonMD 10/13/2013, 11:59 AM  Advanced Heart Failure Team Pager [phone] (M-F; 7a - 4p)  Please contact [facility] for night-coverage after hours (4p -7a ) and weekends on [email or web address]

## 2013-10-14 NOTE — ED Provider Notes (Signed)
I saw and evaluated the patient, reviewed the resident's note and I agree with the findings and plan.   EKG Interpretation   Date/Time:  Friday October 11 2013 12:36:50 EDT Ventricular Rate:  96 PR Interval:  158 QRS Duration: 96 QT Interval:  363 QTC Calculation: 459 R Axis:   46 Text Interpretation:  Sinus rhythm Low voltage, precordial leads Confirmed  by Freida Busman  MD, Alexzandrea Normington ([id number]) on 10/11/2013 12:57:02 PM       Toy Baker, MD 10/14/13 [phone]

## 2013-10-14 NOTE — Progress Notes (Signed)
Advanced Heart Failure Rounding Note   Subjective:     [age] y/o male with morbid obesity, psoriatic arthritis, PAF on Tikosyn s/p ablation. Admitted 3/13 with recurrent pericarditis.  Feels ok. Can take bigger breath but still with CP and requiring more Percocet. On steroids. Diltiazem, eliquis and lasix on hold. HR 50s   Objective:   Weight Range:  Vital Signs:   Temp:  [97.4 F (36.3 C)-97.9 F (36.6 C)] 97.6 F (36.4 C) (03/16 0820) Pulse Rate:  [51] 51 (03/16 0820) Resp:  [16-22] 22 (03/16 0820) BP: (125-142)/(48-86) 142/48 mmHg (03/16 0820) SpO2:  [96 %-98 %] 98 % (03/16 0820) Weight:  [171.7 kg (378 lb 8.5 oz)] 171.7 kg (378 lb 8.5 oz) (03/16 0341) Last BM Date: 10/12/13  Weight change: Filed Weights   10/12/13 0300 10/13/13 0451 10/14/13 0341  Weight: 174.2 kg (384 lb 0.7 oz) 173.546 kg (382 lb 9.6 oz) 171.7 kg (378 lb 8.5 oz)    Intake/Output:   Intake/Output Summary (Last 24 hours) at 10/14/13 1013 Last data filed at 10/14/13 0800  Gross per 24 hour  Intake    960 ml  Output   3725 ml  Net  -2765 ml     Physical Exam: General:  Well appearing. No resp difficulty HEENT: normal Neck: supple. JVP hard to see probably about 7. No kussmauls. Carotids 2+ bilat; no bruits. No lymphadenopathy or thryomegaly appreciated. Cor: PMI nondisplaced. Regular rate & rhythm. No rubs, gallops or murmurs. Lungs: clear Abdomen: obese soft, nontender, nondistended. Good bowel sounds. Extremities: no cyanosis, clubbing, rash, edema Neuro: alert & orientedx3, cranial nerves grossly intact. moves all 4 extremities w/o difficulty. Affect pleasant  Telemetry: SR  Labs: Basic Metabolic Panel:  Recent Labs Lab 10/11/13 1305 10/12/13 0343  NA 135* 134*  K 4.6 4.5  CL 99 99  CO2 21 21  GLUCOSE 138* 156*  BUN 34* 23  CREATININE 1.09 0.66  CALCIUM 8.6 8.4    Liver Function Tests:  Recent Labs Lab 10/11/13 1305  AST 20  ALT 57*  ALKPHOS 56  BILITOT 0.3  PROT 6.6   ALBUMIN 2.6*   No results found for this basename: LIPASE, AMYLASE,  in the last 168 hours No results found for this basename: AMMONIA,  in the last 168 hours  CBC:  Recent Labs Lab 10/11/13 1305 10/12/13 0343  WBC 8.3 6.6  NEUTROABS 6.1  --   HGB 13.9 12.1*  HCT 40.9 35.8*  MCV 88.5 88.8  PLT 243 211    Cardiac Enzymes:  Recent Labs Lab 10/11/13 1305  TROPONINI <0.30    BNP: BNP (last 3 results)  Recent Labs  10/11/13 2230  PROBNP 602.2*     Other results:   Imaging: No results found.   Medications:     Scheduled Medications: . aspirin EC  81 mg Oral Daily  . cholecalciferol  1,000 Units Oral q morning - 10a  . colchicine  0.6 mg Oral BID  . misoprostol  200 mcg Oral BID   And  . diclofenac  75 mg Oral BID  . dofetilide  500 mcg Oral Q12H  . enoxaparin (LOVENOX) injection  80 mg Subcutaneous Q24H  . folic acid  1 mg Oral QPM  . [START ON 11/11/2013] inFLIXimab  100 mg Intravenous Q30 days  . loratadine  10 mg Oral Daily  . multivitamin with minerals  1 tablet Oral Daily  . niacin  500 mg Oral QPM  . omega-3 acid ethyl esters  1 g Oral BID  . pantoprazole  40 mg Oral BID AC  . predniSONE  60 mg Oral Q breakfast  . psyllium  1 packet Oral BID  . simvastatin  5 mg Oral q1800    Infusions: . sodium chloride 10 mL/hr at 10/11/13 1400    PRN Medications: acetaminophen, nitroGLYCERIN, ondansetron (ZOFRAN) IV, oxyCODONE-acetaminophen   Assessment:   1. Acute pericarditis with moderate pericardial effusion 2. PAF  3. Psoriatic arthritis 4. Morbid obesity  Plan/Discussion:    Stable but . Slow HR is reassuring.  Given his size I am very hesitant to try and drain effusion and would likely have to be done surgically. In setting of psoriatic arthritis will continue high-dose steroids although ANA just 1:160 suggesting this probably isn't related to psoriasis flare. Continue colchicine. Repeat echo and CXR tomorrow. I am hopeful this will  resolve with time and steroids. I have contacted his rheumatologist, Dr. Dierdre Forth to discuss his immuno-suppressive regimen.  Eliquis has been placed on hold for now due to risk of bleeding. Will attempt to restart soon. Pharmacy dosing DVT dose lovenox.  Continue Tikosyn for AF. Can restart diltiazem as needed.   Length of Stay: 3   Holly Bodily 10/14/2013, 10:13 AM  Advanced Heart Failure Team Pager [phone] (M-F; 7a - 4p)  Please contact [facility] for night-coverage after hours (4p -7a ) and weekends on [email or web address]

## 2013-10-15 ENCOUNTER — Observation Stay (HOSPITAL_COMMUNITY): Payer: BC Managed Care – PPO

## 2013-10-15 DIAGNOSIS — I059 Rheumatic mitral valve disease, unspecified: Secondary | ICD-10-CM

## 2013-10-15 DIAGNOSIS — I313 Pericardial effusion (noninflammatory): Secondary | ICD-10-CM

## 2013-10-15 DIAGNOSIS — I3139 Other pericardial effusion (noninflammatory): Secondary | ICD-10-CM

## 2013-10-15 NOTE — Progress Notes (Signed)
ANTICOAGULATION CONSULT NOTE - Follow-Up Consult  Pharmacy Consult for Lovenox Indication: VTE prophylaxis  Allergies  Allergen Reactions  . Lisinopril Other (See Comments)    cough    Patient Measurements: Height: 5\' 10"  (177.8 cm) Weight: 373 lb 7.4 oz (169.4 kg) IBW/kg (Calculated) : 73 Heparin Dosing Weight:    Vital Signs: Temp: 97.5 F (36.4 C) (03/17 0740) Temp src: Oral (03/17 0740) BP: 127/68 mmHg (03/17 0740) Pulse Rate: 42 (03/17 0740)  Labs: No results found for this basename: HGB, HCT, PLT, APTT, LABPROT, INR, HEPARINUNFRC, CREATININE, CKTOTAL, CKMB, TROPONINI,  in the last 72 hours  Estimated Creatinine Clearance: 168.6 ml/min (by C-G formula based on Cr of 0.66).   Medical History: Past Medical History  Diagnosis Date  . Paroxysmal atrial fibrillation     a. s/p DCCV 09/2007, 11/2007, 04/2013 --> flecainide and chronic coumadin.  . Morbid obesity   . Hypertension   . Psoriatic arthritis   . Cellulitis   . Atrial flutter     a. s/p DCCV 08/16/2012.  Marland Kitchen. NICM (nonischemic cardiomyopathy)     a. EF 45-50% in setting of AF in past; b. Neg MV;  c. 04/2010 Echo: EF 55-60%;  d. 11/2011 Cath: nl cors.    Medications:  Prescriptions prior to admission  Medication Sig Dispense Refill  . apixaban (ELIQUIS) 5 MG TABS tablet Take 1 tablet (5 mg total) by mouth 2 (two) times daily.  60 tablet  6  . aspirin 81 MG tablet Take 81 mg by mouth at bedtime.       . cetirizine (ZYRTEC) 10 MG tablet Take 10 mg by mouth as directed. TAKE 1 TABLET X 3 DAYS BEFORE THE REMACAID INJECTION      . cholecalciferol (VITAMIN D) 1000 UNITS tablet Take 1,000 Units by mouth every morning.       . Colchicine 0.6 MG CAPS Take 1 capsule by mouth 2 (two) times daily.  60 capsule  6  . diclofenac-misoprostol (ARTHROTEC 75) 75-0.2 MG per tablet Take 1 tablet by mouth 2 (two) times daily.        Marland Kitchen. diltiazem (CARDIZEM CD) 180 MG 24 hr capsule Take 1 capsule (180 mg total) by mouth every morning.  30  capsule  6  . dofetilide (TIKOSYN) 500 MCG capsule Take 1 capsule (500 mcg total) by mouth every 12 (twelve) hours.  60 capsule  6  . fish oil-omega-3 fatty acids 1000 MG capsule Take 1 g by mouth 2 (two) times daily.       . folic acid (FOLVITE) 1 MG tablet Take 1 mg by mouth every evening.       . furosemide (LASIX) 20 MG tablet Take 1 tablet (20 mg total) by mouth as needed. Takes in the morning  30 tablet  6  . ibuprofen (ADVIL,MOTRIN) 800 MG tablet Take 1 tablet (800 mg total) by mouth 3 (three) times daily.  90 tablet  1  . inFLIXimab (REMICADE) 100 MG injection Inject 100 mg into the vein every 30 (thirty) days.      Marland Kitchen. lovastatin (MEVACOR) 20 MG tablet take 1 tablet by mouth at bedtime  30 tablet  7  . MAGNESIUM PO Take 1 tablet by mouth daily.      Marland Kitchen. METHOTREXATE SODIUM IJ Inject 0.6 mLs as directed every 7 (seven) days. Sundays in evenings      . Multiple Vitamin (MULTIVITAMIN) tablet Take 1 tablet by mouth every morning.       . niacin (  NIASPAN) 500 MG CR tablet Take 500 mg by mouth every evening.      Marland Kitchen oxyCODONE-acetaminophen (PERCOCET/ROXICET) 5-325 MG per tablet Take 1 tablet by mouth every 6 (six) hours as needed for severe pain.       . pantoprazole (PROTONIX) 40 MG tablet Take 1 tablet (40 mg total) by mouth 2 (two) times daily before a meal.  60 tablet  6  . potassium chloride SA (K-DUR,KLOR-CON) 20 MEQ tablet Take 1 tablet (20 mEq total) by mouth as needed. Take 20 meq when you take lasix  30 tablet  6  . predniSONE (DELTASONE) 20 MG tablet Take 40 mg for 2 days then take 30 mg for 2 days, then take 20 mg for 2 days then take 10 mg for 2 days.  20 tablet  0  . Psyllium Husk POWD Take 1 Package by mouth 2 (two) times daily. For bowel regularity      . valsartan (DIOVAN) 80 MG tablet Take 80 mg by mouth daily.        Assessment: 53 y/o male with morbid obesity, psoriatic arthritis, PAF on Tikosyn s/p ablation. Admitted 3/13 with recurrent pericarditis and pericardial effusion.  Eliquis on hold. Last dose noted 3/13. Started LMWH for DVT prophylaxis.  Given obesity, receiving 0.5 mg/kg/day of Lovenox.  Labs: Scr 0.66 with CrCl>100. proBNP 602, Hgb 12.1, Plts 211  Heme/Onc: psoriatic arthritis on Diclfenac, Remicade, misoprostol, prednisone   Goal of Therapy:  DVT prophylaxis Monitor platelets by anticoagulation protocol: Yes   Plan:  Continue Lovenox 80mg  SQ q24h (approx 0.5mg /kg/day) CBC q72h on LMWH - will order with AM labs tomorrow.  Will also check BMET to ensure dose remains appropriate.  Tad Moore, BCPS  Clinical Pharmacist Pager [phone]  10/15/2013 10:37 AM

## 2013-10-15 NOTE — Progress Notes (Signed)
Advanced Heart Failure Rounding Note   Subjective:     53 y/o male with morbid obesity, psoriatic arthritis, PAF on Tikosyn s/p ablation. Admitted 3/13 with recurrent pericarditis.  Feels ok. Still with some CP. CXR this am with only very small pleural effusions.   On steroids. Diltiazem, eliquis and lasix on hold. HR 50s   Objective:   Weight Range:  Vital Signs:   Temp:  [97.5 F (36.4 C)-98.2 F (36.8 C)] 97.5 F (36.4 C) (03/17 0740) Pulse Rate:  [42] 42 (03/17 0740) Resp:  [16-18] 16 (03/17 0740) BP: (102-137)/(65-81) 127/68 mmHg (03/17 0740) SpO2:  [97 %-98 %] 98 % (03/17 0740) Weight:  [169.4 kg (373 lb 7.4 oz)] 169.4 kg (373 lb 7.4 oz) (03/17 0400) Last BM Date: 10/12/13  Weight change: Filed Weights   10/13/13 0451 10/14/13 0341 10/15/13 0400  Weight: 173.546 kg (382 lb 9.6 oz) 171.7 kg (378 lb 8.5 oz) 169.4 kg (373 lb 7.4 oz)    Intake/Output:   Intake/Output Summary (Last 24 hours) at 10/15/13 0917 Last data filed at 10/15/13 0000  Gross per 24 hour  Intake      0 ml  Output   5050 ml  Net  -5050 ml     Physical Exam: General:  Well appearing. No resp difficulty HEENT: normal Neck: supple. JVP hard to see probably about 7. No kussmauls. Carotids 2+ bilat; no bruits. No lymphadenopathy or thryomegaly appreciated. Cor: PMI nondisplaced. Regular rate & rhythm. No rubs, gallops or murmurs. Lungs: clear Abdomen: obese soft, nontender, nondistended. Good bowel sounds. Extremities: no cyanosis, clubbing, rash, edema Neuro: alert & orientedx3, cranial nerves grossly intact. moves all 4 extremities w/o difficulty. Affect pleasant  Telemetry: SR  Labs: Basic Metabolic Panel:  Recent Labs Lab 10/11/13 1305 10/12/13 0343  NA 135* 134*  K 4.6 4.5  CL 99 99  CO2 21 21  GLUCOSE 138* 156*  BUN 34* 23  CREATININE 1.09 0.66  CALCIUM 8.6 8.4    Liver Function Tests:  Recent Labs Lab 10/11/13 1305  AST 20  ALT 57*  ALKPHOS 56  BILITOT 0.3  PROT  6.6  ALBUMIN 2.6*   No results found for this basename: LIPASE, AMYLASE,  in the last 168 hours No results found for this basename: AMMONIA,  in the last 168 hours  CBC:  Recent Labs Lab 10/11/13 1305 10/12/13 0343  WBC 8.3 6.6  NEUTROABS 6.1  --   HGB 13.9 12.1*  HCT 40.9 35.8*  MCV 88.5 88.8  PLT 243 211    Cardiac Enzymes:  Recent Labs Lab 10/11/13 1305  TROPONINI <0.30    BNP: BNP (last 3 results)  Recent Labs  10/11/13 2230  PROBNP 602.2*     Other results:   Imaging: Dg Chest 2 View  10/15/2013   CLINICAL DATA:  Pleural effusion  EXAM: CHEST  2 VIEW  COMPARISON:  DG CHEST 1 VIEW dated 10/11/2013  FINDINGS: Low volumes. Bibasilar atelectasis. Small pleural effusions. Cardiomegaly. Mild vascular congestion has improved.  IMPRESSION: Vascular congestion has improved. Cardiomegaly, low volumes, and small pleural effusions persist.   Electronically Signed   By: Maryclare BeanArt  Hoss M.D.   On: 10/15/2013 08:28     Medications:     Scheduled Medications: . aspirin EC  81 mg Oral Daily  . cholecalciferol  1,000 Units Oral q morning - 10a  . colchicine  0.6 mg Oral BID  . misoprostol  200 mcg Oral BID   And  . diclofenac  75 mg Oral BID  . dofetilide  500 mcg Oral Q12H  . enoxaparin (LOVENOX) injection  80 mg Subcutaneous Q24H  . folic acid  1 mg Oral QPM  . [START ON 11/11/2013] inFLIXimab  100 mg Intravenous Q30 days  . loratadine  10 mg Oral Daily  . multivitamin with minerals  1 tablet Oral Daily  . niacin  500 mg Oral QPM  . omega-3 acid ethyl esters  1 g Oral BID  . pantoprazole  40 mg Oral BID AC  . predniSONE  60 mg Oral Q breakfast  . psyllium  1 packet Oral BID  . simvastatin  5 mg Oral q1800    Infusions: . sodium chloride 10 mL/hr at 10/11/13 1400    PRN Medications: acetaminophen, nitroGLYCERIN, ondansetron (ZOFRAN) IV, oxyCODONE-acetaminophen   Assessment:   1. Acute pericarditis with moderate pericardial effusion 2. PAF  3. Psoriatic  arthritis 4. Morbid obesity  Plan/Discussion:    Doing well. CXR without significant effusions. Await echo today to reassess size of pericardial effusion. I d/w Rheum yesterday and they would like to keep steroids at 60 daily for at least 2 weeks.  Repeat ANA and dsDNA pending.   Eliquis has been placed on hold for now due to risk of bleeding. Will attempt to restart soon. Pharmacy dosing DVT dose lovenox.  Continue Tikosyn for AF (currently quiescent). Can restart diltiazem as needed.   Possibly home tomorrow.  Length of Stay: 4   Daniel BensimhonMD 10/15/2013, 9:17 AM  Advanced Heart Failure Team Pager [phone] (M-F; 7a - 4p)  Please contact [facility] for night-coverage after hours (4p -7a ) and weekends on [email or web address]

## 2013-10-15 NOTE — Progress Notes (Signed)
  Echocardiogram 2D Echocardiogram (limited) has been performed.  Jorje Guild 10/15/2013, 9:48 AM

## 2013-10-16 LAB — CBC
HEMATOCRIT: 40.2 % (ref 39.0–52.0)
Hemoglobin: 13.8 g/dL (ref 13.0–17.0)
MCH: 30.2 pg (ref 26.0–34.0)
MCHC: 34.3 g/dL (ref 30.0–36.0)
MCV: 88 fL (ref 78.0–100.0)
Platelets: 244 10*3/uL (ref 150–400)
RBC: 4.57 MIL/uL (ref 4.22–5.81)
RDW: 13.5 % (ref 11.5–15.5)
WBC: 7.6 10*3/uL (ref 4.0–10.5)

## 2013-10-16 LAB — BASIC METABOLIC PANEL
BUN: 17 mg/dL (ref 6–23)
CHLORIDE: 103 meq/L (ref 96–112)
CO2: 26 mEq/L (ref 19–32)
Calcium: 8.9 mg/dL (ref 8.4–10.5)
Creatinine, Ser: 0.73 mg/dL (ref 0.50–1.35)
GFR calc non Af Amer: >90 mL/min (ref >90)
Glucose, Bld: 108 mg/dL — ABNORMAL HIGH (ref 70–99)
Potassium: 4 mEq/L (ref 3.7–5.3)
Sodium: 141 mEq/L (ref 137–147)

## 2013-10-16 MED ORDER — APIXABAN 5 MG PO TABS
5.0000 mg | ORAL_TABLET | Freq: Two times a day (BID) | ORAL | Status: DC
  Administered 2013-10-16: 5 mg via ORAL
  Filled 2013-10-16 (×2): qty 1

## 2013-10-16 MED ORDER — TRAMADOL HCL 50 MG PO TABS: 100.0000 mg | ORAL_TABLET | Freq: Three times a day (TID) | ORAL | Status: DC

## 2013-10-16 MED ORDER — OXYCODONE-ACETAMINOPHEN 5-325 MG PO TABS: 1.0000 | ORAL_TABLET | Freq: Four times a day (QID) | ORAL | Status: DC | PRN

## 2013-10-16 MED ORDER — PREDNISONE 20 MG PO TABS: ORAL_TABLET | ORAL | Status: DC

## 2013-10-16 NOTE — Progress Notes (Signed)
Discharged home accompanied by wife, discharge instructions and prescription given to pt. Belongings taken home. 

## 2013-10-16 NOTE — Discharge Summary (Signed)
DISCHARGE SUMMARY  Patient ID: [name] MRN: [id number] DOB/AGE: [birth year] 53 y.o.  Admit date: 10/11/2013 Discharge date: 10/16/2013  Primary Discharge Diagnosis  1. Acute pericarditis 2. Moderate-sized pericardial effusion 3. Paroxysmal atrial fibrillation 4. Psoriatic arthritis  Hospital Course:   [name] is a very pleasant 53 y/o male with a history of psoriatic arthritis, obesity, hypertension, and atrial fibrillation s/p AF ablation with AF recurrence. He had been maintained on Tikosyn   He was admitted from 3/3 to 3/6 with acute pericarditis with a small pericardial effusion. He had continue pain despite NSAID and colchicine therapy so was started on a 10-day prednisone taper. As ANA was 1:160 it was though that his pericarditis was likely viral and not auto-immune mediated.   He was readmitted on 3/13 with recurrent CP and SOB. He was given NTG in ER and became quite hypotensive requiring IVF resuscitation. Stat echo was obtained which showed normal LV and RV function with a moderate-sized pericardial effusion which was larger than previous. There was some variation in MV inflow but no concerning evidence for tamponade. He was admitted to the stepdown unit and his steroids were re-initiated with prednisone 60 mg per day. He was maintained on colchicine 0.6 bid. He continued with CP and required percocet for breakthrough pain. His Eliquis was held given the possibility of hemorrhagic conversion of his effusion or need for drainage. He did not have any recurrence of his AF and he remained hemodynamically stable. A repeat echo was obtained on 3/17 and showed essentially complete resolution of his effusion. CXR showed minimal pleural effusion.   On 3/18 he was felt stable for d/c. I discussed his steroid regimen with his rheumatologist Dr. Dierdre Forth and we have decided on at least a month steroid taper starting with prednisone 60 mg daily for 10 days followed by 40 mg daily. Dr.  Dierdre Forth will see him in 1-2 weeks and decide on further taper. He will continue colchicine for 2-3 months.  We will start tramadol for pain with Percocet only for breakthrough use. Diltiazem was stopped due to bradycardia.  Take lasix only prn     Discharge Info: Blood pressure 141/96, pulse 49, temperature 97.8 F (36.6 C), temperature source Oral, resp. rate 22, height 5\' 10"  (1.778 m), weight 167.7 kg (369 lb 11.4 oz), SpO2 97.00%.   Physical Exam:  General: Well appearing. No resp difficulty  HEENT: normal  Neck: supple. JVP hard to see probably about 6. No kussmauls. Carotids 2+ bilat; no bruits. No lymphadenopathy or thryomegaly appreciated.  Cor: PMI nondisplaced. Regular rate & rhythm. No rubs, gallops or murmurs.  Lungs: clear  Abdomen: obese soft, nontender, nondistended. Good bowel sounds.  Extremities: no cyanosis, clubbing, rash, edema  Neuro: alert & orientedx3, cranial nerves grossly intact. moves all 4 extremities w/o difficulty. Affect pleasant   Weight change: -1.7 kg (-3 lb 12 oz) Results for orders placed during the hospital encounter of 10/11/13 (from the past 24 hour(s))  CBC     Status: None   Collection Time    10/16/13  2:39 AM      Result Value Ref Range   WBC 7.6  4.0 - 10.5 K/uL   RBC 4.57  4.22 - 5.81 MIL/uL   Hemoglobin 13.8  13.0 - 17.0 g/dL   HCT 84.1  32.4 - 40.1 %   MCV 88.0  78.0 - 100.0 fL   MCH 30.2  26.0 - 34.0 pg   MCHC 34.3  30.0 -  36.0 g/dL   RDW 16.113.5  09.611.5 - 04.515.5 %   Platelets 244  150 - 400 K/uL  BASIC METABOLIC PANEL     Status: Abnormal   Collection Time    10/16/13  2:39 AM      Result Value Ref Range   Sodium 141  137 - 147 mEq/L   Potassium 4.0  3.7 - 5.3 mEq/L   Chloride 103  96 - 112 mEq/L   CO2 26  19 - 32 mEq/L   Glucose, Bld 108 (*) 70 - 99 mg/dL   BUN 17  6 - 23 mg/dL   Creatinine, Ser 4.090.73  0.50 - 1.35 mg/dL   Calcium 8.9  8.4 - 81.110.5 mg/dL   GFR calc non Af Amer >90  >90 mL/min   GFR calc Af Amer >90  >90 mL/min      Discharge Medications:   Medication List    ASK your doctor about these medications       apixaban 5 MG Tabs tablet  Commonly known as:  ELIQUIS  Take 1 tablet (5 mg total) by mouth 2 (two) times daily.     aspirin 81 MG tablet  Take 81 mg by mouth at bedtime.     cetirizine 10 MG tablet  Commonly known as:  ZYRTEC  Take 10 mg by mouth as directed. TAKE 1 TABLET X 3 DAYS BEFORE THE REMACAID INJECTION     cholecalciferol 1000 UNITS tablet  Commonly known as:  VITAMIN D  Take 1,000 Units by mouth every morning.     Colchicine 0.6 MG Caps  Take 1 capsule by mouth 2 (two) times daily.     diclofenac-misoprostol 75-0.2 MG per tablet  Commonly known as:  ARTHROTEC 75  Take 1 tablet by mouth 2 (two) times daily.     diltiazem 180 MG 24 hr capsule  Commonly known as:  CARDIZEM CD  Take 1 capsule (180 mg total) by mouth every morning.     dofetilide 500 MCG capsule  Commonly known as:  TIKOSYN  Take 1 capsule (500 mcg total) by mouth every 12 (twelve) hours.     fish oil-omega-3 fatty acids 1000 MG capsule  Take 1 g by mouth 2 (two) times daily.     folic acid 1 MG tablet  Commonly known as:  FOLVITE  Take 1 mg by mouth every evening.     furosemide 20 MG tablet  Commonly known as:  LASIX  Take 1 tablet (20 mg total) by mouth as needed. Takes in the morning     ibuprofen 800 MG tablet  Commonly known as:  ADVIL,MOTRIN  Take 1 tablet (800 mg total) by mouth 3 (three) times daily.     inFLIXimab 100 MG injection  Commonly known as:  REMICADE  Inject 100 mg into the vein every 30 (thirty) days.     lovastatin 20 MG tablet  Commonly known as:  MEVACOR  take 1 tablet by mouth at bedtime     MAGNESIUM PO  Take 1 tablet by mouth daily.     METHOTREXATE SODIUM IJ  Inject 0.6 mLs as directed every 7 (seven) days. Sundays in evenings     multivitamin tablet  Take 1 tablet by mouth every morning.     niacin 500 MG CR tablet  Commonly known as:  NIASPAN  Take  500 mg by mouth every evening.     oxyCODONE-acetaminophen 5-325 MG per tablet  Commonly known as:  PERCOCET/ROXICET  Take  1 tablet by mouth every 6 (six) hours as needed for severe pain.     pantoprazole 40 MG tablet  Commonly known as:  PROTONIX  Take 1 tablet (40 mg total) by mouth 2 (two) times daily before a meal.     potassium chloride SA 20 MEQ tablet  Commonly known as:  K-DUR,KLOR-CON  Take 1 tablet (20 mEq total) by mouth as needed. Take 20 meq when you take lasix     predniSONE 20 MG tablet  Commonly known as:  DELTASONE  Take 40 mg for 2 days then take 30 mg for 2 days, then take 20 mg for 2 days then take 10 mg for 2 days.     Psyllium Husk Powd  Take 1 Package by mouth 2 (two) times daily. For bowel regularity     valsartan 80 MG tablet  Commonly known as:  DIOVAN  Take 80 mg by mouth daily.        Follow-up Plans & Instructions:  Future Appointments Provider Department Dept Phone   10/18/2013 11:00 AM [facility] [phone]      BRING ALL MEDICATIONS WITH YOU TO FOLLOW UP APPOINTMENTS  Time spent with patient to include physician time: 45 minutes Signed:  Arvilla Meres, MD 10/16/2013, 11:33 AM

## 2013-10-16 NOTE — Progress Notes (Deleted)
Complained of chest pain, epigastric pain back and neck, MD aware, GI cocktail given with slight relief. Not due for his pain pill, continue to monitor.

## 2013-10-17 LAB — ANTI-NUCLEAR AB-TITER (ANA TITER): ANA Titer 1: >1:2560 {titer}

## 2013-10-17 LAB — CULTURE, BLOOD (ROUTINE X 2)
CULTURE: NO GROWTH
CULTURE: NO GROWTH

## 2013-10-17 LAB — ANA: Anti Nuclear Antibody(ANA): POSITIVE — AB

## 2013-10-17 LAB — ANTI-DNA ANTIBODY, DOUBLE-STRANDED: ds DNA Ab: <1 IU/mL

## 2013-10-18 ENCOUNTER — Ambulatory Visit (HOSPITAL_COMMUNITY)
Admission: RE | Admit: 2013-10-18 | Discharge: 2013-10-18 | Disposition: A | Discharge status: Home / Self Care | Payer: BC Managed Care – PPO | Source: Ambulatory Visit | Attending: Internal Medicine | Admitting: Internal Medicine

## 2013-10-18 VITALS — BP 112/62 | HR 64 | Wt 375.2 lb

## 2013-10-18 DIAGNOSIS — I319 Disease of pericardium, unspecified: Secondary | ICD-10-CM | POA: Insufficient documentation

## 2013-10-18 DIAGNOSIS — I10 Essential (primary) hypertension: Secondary | ICD-10-CM | POA: Insufficient documentation

## 2013-10-18 DIAGNOSIS — E669 Obesity, unspecified: Secondary | ICD-10-CM | POA: Insufficient documentation

## 2013-10-18 DIAGNOSIS — I4891 Unspecified atrial fibrillation: Secondary | ICD-10-CM | POA: Insufficient documentation

## 2013-10-18 DIAGNOSIS — L4050 Arthropathic psoriasis, unspecified: Secondary | ICD-10-CM | POA: Insufficient documentation

## 2013-10-18 DIAGNOSIS — I309 Acute pericarditis, unspecified: Secondary | ICD-10-CM

## 2013-10-18 NOTE — Progress Notes (Signed)
Advanced Heart Failure Rounding Note   Subjective:    Mark Gonzalez is a very pleasant 53 y/o male with a history of psoriatic arthritis, obesity, hypertension, and atrial fibrillation. In 2009 he was admitted for Tikosyn load followed by successful DC-CV. He continued on 500 mcg Tikosyn bid but later went back into A fib. He was referred to [facility] for an ablation. He had AF ablation at [facility] and was maintained in NSR on flecainide but went back into A fib. Over the last year he has required 2 cardioversions. (S/P succesful DC-CV 08/16/12 and DC-CV 04/26/13).   Last week he went back into A fib. He was instructed to stop flecainide 05/30/13 and plan for admit today. Over the weekend he noticed increased dyspnea with exertion and while playing his saxophone.   We started Tikosyn in 06/2013. He had an early recurrence of AF in about 2 weeks. Had 4 day episode of AF around Xmas but then went back into NSR and has held for the most part. Has seen Dr. Deno Lunger and tentatively planned for ablation on Monday.  Recently admitted with acute pericarditis. Treated with colchicine and steroids. Had recurrence and readmitted. Echo with moderate pericardial effusion. Started on prednisone 60 daily. Effusion resolved after 1 week. Returns for f/u. Remains on prednisone 60 daily. Colchicine 0.6 bid. Ultram 100 tid and percocet for breakthrough. Still with significant pleuritic CP. Trying to limit percocet only to evenings for sleep but having breakthrough. No palpitations. HR remains 50-60 off diltiazem.  ANA initially 1:160 now 1:2560 dsDNA <1  Objective:   Weight Range:  Vital Signs:    Weight change: Filed Vitals:   10/18/13 1110  BP: 112/62  Pulse: 64  Weight: 375 lb 4 oz (170.212 kg)  SpO2: 95%    Physical Exam: General:  Well appearing. No resp difficulty HEENT: normal Neck: supple. JVP flat . Carotids 2+ bilat; no bruits. No lymphadenopathy or thryomegaly appreciated. Cor: PMI nondisplaced. Regular rate  & rhythm. No rubs, gallops or murmurs. Lungs: clear Abdomen: Obese, soft, nontender, nondistended. No hepatosplenomegaly. No bruits or masses. Good bowel sounds. Extremities: no cyanosis, clubbing, rash, edema Neuro: alert & orientedx3, cranial nerves grossly intact. moves all 4 extremities w/o difficulty. Affect pleasant   Imaging: No results found.   Assessment:   1. Acute pericarditis with pericardial effusion 2. PAF in NSR on Tikosyn 3. Psoriatic Arthritis  4. Obesity  5. HTN   Plan/Discussion:    Overall improving but still with some CP. Will continue current regimen. Continue steroids. Can take percocet as needed for breakthrough pain.  ANA now elevated. I discussed case with Dr. Dierdre Forth again and concern has been raised about Remicade possibly being causative agent for pericarditis. He d/w him next week and manage steroid taper.   AF quiescent. Continue Eliquis and Tikosyn.  Jiyan Walkowski,MD 11:32 AM

## 2013-10-28 ENCOUNTER — Ambulatory Visit (HOSPITAL_COMMUNITY): Payer: BC Managed Care – PPO

## 2013-11-05 ENCOUNTER — Other Ambulatory Visit (HOSPITAL_COMMUNITY): Payer: Self-pay

## 2013-11-05 MED ORDER — TRAMADOL HCL 50 MG PO TABS: 100.0000 mg | ORAL_TABLET | Freq: Three times a day (TID) | ORAL | Status: DC

## 2013-11-06 ENCOUNTER — Other Ambulatory Visit (HOSPITAL_COMMUNITY): Payer: Self-pay | Admitting: *Deleted

## 2013-11-06 MED ORDER — TRAMADOL HCL 50 MG PO TABS: 100.0000 mg | ORAL_TABLET | Freq: Three times a day (TID) | ORAL | Status: DC

## 2013-11-06 NOTE — Telephone Encounter (Signed)
pts wife will come by and pick up

## 2013-11-13 ENCOUNTER — Other Ambulatory Visit: Payer: Self-pay | Admitting: Internal Medicine

## 2013-12-15 ENCOUNTER — Other Ambulatory Visit: Payer: Self-pay | Admitting: Internal Medicine

## 2013-12-18 ENCOUNTER — Ambulatory Visit (HOSPITAL_COMMUNITY)
Admission: RE | Admit: 2013-12-18 | Discharge: 2013-12-18 | Disposition: A | Discharge status: Home / Self Care | Payer: BC Managed Care – PPO | Source: Ambulatory Visit | Attending: Internal Medicine | Admitting: Internal Medicine

## 2013-12-18 ENCOUNTER — Encounter (HOSPITAL_COMMUNITY): Payer: Self-pay

## 2013-12-18 VITALS — BP 126/82 | HR 70 | Wt 372.8 lb

## 2013-12-18 DIAGNOSIS — I4891 Unspecified atrial fibrillation: Secondary | ICD-10-CM | POA: Diagnosis present

## 2013-12-18 DIAGNOSIS — R609 Edema, unspecified: Secondary | ICD-10-CM | POA: Diagnosis not present

## 2013-12-18 DIAGNOSIS — I10 Essential (primary) hypertension: Secondary | ICD-10-CM | POA: Diagnosis not present

## 2013-12-18 DIAGNOSIS — Z7901 Long term (current) use of anticoagulants: Secondary | ICD-10-CM | POA: Insufficient documentation

## 2013-12-18 DIAGNOSIS — R0789 Other chest pain: Secondary | ICD-10-CM

## 2013-12-18 DIAGNOSIS — E669 Obesity, unspecified: Secondary | ICD-10-CM | POA: Diagnosis not present

## 2013-12-18 DIAGNOSIS — L4050 Arthropathic psoriasis, unspecified: Secondary | ICD-10-CM | POA: Diagnosis not present

## 2013-12-18 NOTE — Progress Notes (Signed)
.   Advanced Heart Failure Rounding Note   Subjective:    Mark Gonzalez is a very pleasant 53 y/o male with a history of psoriatic arthritis, obesity, hypertension, and atrial fibrillation. In 2009 he was admitted for Tikosyn load followed by successful DC-CV. He continued on 500 mcg Tikosyn bid but later went back into A fib. He was referred to [facility] for an ablation. He had AF ablation at [facility] and was maintained in NSR on flecainide but went back into A fib. Over the last year he has required 2 cardioversions. (S/P succesful DC-CV 08/16/12 and DC-CV 04/26/13).   Last week he went back into A fib. He was instructed to stop flecainide 05/30/13 and plan for admit today. Over the weekend he noticed increased dyspnea with exertion and while playing his saxophone.   We started Tikosyn in 06/2013. He had an early recurrence of AF in about 2 weeks. Had 4 day episode of AF around Xmas but then went back into NSR and has held for the most part. Has seen Dr. Deno Lunger and tentatively planned for ablation on Monday.  Admitted several times in March with acute pericarditis. Treated with colchicine and steroids. Had recurrence and readmitted. Echo with moderate pericardial effusion. Started on prednisone 60 daily. Effusion resolved after 1 week. Prednisone weaned off by Dr. Dierdre Forth about a week ago. Still has pain in his chest when he takes a very deep breath. Remicade stopped as it was thought that it might be causative. Switched to [facility] for arthritis but not working as well.   No palpitations. HR remains 50-60 off diltiazem. Had to restart lasix for edema. Taking it M,W,F. No orthopnea or PND.    Objective:   Weight Range:  Vital Signs:    Weight change: Filed Vitals:   12/18/13 1158  BP: 126/82  Pulse: 70  Weight: 372 lb 12.8 oz (169.101 kg)  SpO2: 96%    Physical Exam: General:  Well appearing. No resp difficulty HEENT: normal Neck: supple. JVP flat . Carotids 2+ bilat; no bruits. No lymphadenopathy  or thryomegaly appreciated. Cor: PMI nondisplaced. Regular rate & rhythm. No rubs, gallops or murmurs. Lungs: clear Abdomen: Obese, soft, nontender, nondistended. No hepatosplenomegaly. No bruits or masses. Good bowel sounds. Extremities: no cyanosis, clubbing, rash, 2+ edema Neuro: alert & orientedx3, cranial nerves grossly intact. moves all 4 extremities w/o difficulty. Affect pleasant  Sinus brady 53. QTC 431 ms  Imaging: No results found.   Assessment:   1. PAF in NSR on Tikosyn 2. Recent pericarditis 3. Psoriatic Arthritis  4. Obesity  5. HTN  6. LE edema   Plan/Discussion:    Doing well. Does have volume on board and have asked him to take lasix for 3 days in a row to get the fluid off. Then can switch back to M,W,F. Take extra as needed. Take with potassium.   AF is quiescent on Tikosyn. QT interval is fine. Anti-coagulated with Eliquis. No need to see Dr. Berneice Gandy back at this point. AF likely flared in setting of recent pericarditis.   Agree with Dr. Dierdre Forth that CP is likely costochrondritis.   Bevelyn Buckles Rashon Westrup,MD 12:43 PM

## 2013-12-19 NOTE — Addendum Note (Signed)
Encounter addended by: Ernestina Penna, CCT on: 12/19/2013  9:17 AM<BR>     Documentation filed: Charges VN

## 2013-12-24 ENCOUNTER — Other Ambulatory Visit (HOSPITAL_COMMUNITY): Payer: Self-pay

## 2013-12-24 MED ORDER — APIXABAN 5 MG PO TABS: 5.0000 mg | ORAL_TABLET | Freq: Two times a day (BID) | ORAL | Status: DC

## 2014-01-30 ENCOUNTER — Telehealth (HOSPITAL_COMMUNITY): Payer: Self-pay | Admitting: Vascular Surgery

## 2014-02-07 ENCOUNTER — Other Ambulatory Visit (HOSPITAL_COMMUNITY): Payer: Self-pay

## 2014-02-07 MED ORDER — DOFETILIDE 500 MCG PO CAPS: 500.0000 ug | ORAL_CAPSULE | Freq: Two times a day (BID) | ORAL | Status: DC

## 2014-03-05 ENCOUNTER — Other Ambulatory Visit: Payer: Self-pay | Admitting: Internal Medicine

## 2014-03-06 ENCOUNTER — Telehealth (HOSPITAL_COMMUNITY): Payer: Self-pay | Admitting: Vascular Surgery

## 2014-03-06 ENCOUNTER — Ambulatory Visit (HOSPITAL_COMMUNITY)
Admission: RE | Admit: 2014-03-06 | Discharge: 2014-03-06 | Disposition: A | Discharge status: Home / Self Care | Payer: BC Managed Care – PPO | Source: Ambulatory Visit | Attending: Cardiology | Admitting: Cardiology

## 2014-03-06 VITALS — BP 128/82 | HR 82 | Wt 371.5 lb

## 2014-03-06 DIAGNOSIS — I4891 Unspecified atrial fibrillation: Secondary | ICD-10-CM | POA: Insufficient documentation

## 2014-03-06 DIAGNOSIS — E669 Obesity, unspecified: Secondary | ICD-10-CM | POA: Diagnosis not present

## 2014-03-06 DIAGNOSIS — R609 Edema, unspecified: Secondary | ICD-10-CM | POA: Insufficient documentation

## 2014-03-06 DIAGNOSIS — Z7901 Long term (current) use of anticoagulants: Secondary | ICD-10-CM | POA: Diagnosis not present

## 2014-03-06 DIAGNOSIS — I480 Paroxysmal atrial fibrillation: Secondary | ICD-10-CM

## 2014-03-06 DIAGNOSIS — I10 Essential (primary) hypertension: Secondary | ICD-10-CM | POA: Diagnosis not present

## 2014-03-06 DIAGNOSIS — R0789 Other chest pain: Secondary | ICD-10-CM | POA: Diagnosis not present

## 2014-03-06 DIAGNOSIS — L4050 Arthropathic psoriasis, unspecified: Secondary | ICD-10-CM | POA: Insufficient documentation

## 2014-03-06 MED ORDER — FUROSEMIDE 40 MG PO TABS: 40.0000 mg | ORAL_TABLET | ORAL | Status: DC

## 2014-03-06 NOTE — Addendum Note (Signed)
Encounter addended by: Theresia Bough, CMA on: 03/06/2014  3:27 PM<BR>     Documentation filed: Medications, Orders, Patient Instructions Section

## 2014-03-06 NOTE — Addendum Note (Signed)
Encounter addended by: Dolores Patty, MD on: 03/06/2014  3:34 PM<BR>     Documentation filed: Notes Section

## 2014-03-06 NOTE — Progress Notes (Addendum)
Patient ID: Mark AlvineWallace W Phillis Jr., male   DOB: 06/09/1961, 53 y.o.   MRN: [id number] . Advanced Heart Failure Rounding Note   Subjective:    Mark Gonzalez is a very pleasant 53 y/o male with a history of psoriatic arthritis, obesity, hypertension, and atrial fibrillation. In 2009 he was admitted for Tikosyn load followed by successful DC-CV. He continued on 500 mcg Tikosyn bid but later went back into A fib. He was referred to [facility] for an ablation. He had AF ablation [facility] and was maintained in NSR on flecainide but went back into A fib. Over the last year he has required 2 cardioversions. (S/P succesful DC-CV 08/16/12 and DC-CV 04/26/13).   Failed Flecainide. We started Tikosyn in 06/2013. He had an early recurrence of AF in about 2 weeks. Had 4 day episode of AF around Xmas but then went back into NSR and has held for the most part  Admitted several times in March with acute pericarditis. Treated with colchicine and steroids. Had recurrence and readmitted. Echo with moderate pericardial effusion. Started on prednisone 60 daily. Effusion resolved after 1 week. Remicade stopped as it was thought that it might be causative.   Follow-up: Says he has an episode of AF about every 6-8 weeks. Typically last ~24 hours or a little less. Feels palpitations but otherwise relatively asx. Rates 80-90s. Returns to NSR on its own. Still with occasional CP on taking breaths. No bleeding on Eliqius. Has had lots of LE edema. Worse when standing. Resolves over night. Watching fluid intake. Increased lasix intake 3x-> 4x week. Urine output doesn't increase much with lasix. Weight stable.    Current Outpatient Prescriptions on File Prior to Encounter  Medication Sig Dispense Refill  . apixaban (ELIQUIS) 5 MG TABS tablet Take 1 tablet (5 mg total) by mouth 2 (two) times daily.  60 tablet  6  . cholecalciferol (VITAMIN D) 1000 UNITS tablet Take 1,000 Units by mouth every morning.       . Colchicine 0.6 MG CAPS Take 1 capsule by  mouth 2 (two) times daily.  60 capsule  6  . dofetilide (TIKOSYN) 500 MCG capsule Take 1 capsule (500 mcg total) by mouth every 12 (twelve) hours.  60 capsule  6  . fish oil-omega-3 fatty acids 1000 MG capsule Take 1 g by mouth 2 (two) times daily.       . folic acid (FOLVITE) 1 MG tablet Take 1 mg by mouth every evening.       . lovastatin (MEVACOR) 20 MG tablet take 1 tablet by mouth at bedtime  30 tablet  7  . MAGNESIUM PO Take 1 tablet by mouth daily.      Marland Kitchen. METHOTREXATE SODIUM IJ Inject 0.6 mLs as directed every 7 (seven) days. Sundays in evenings      . Multiple Vitamin (MULTIVITAMIN) tablet Take 1 tablet by mouth every morning.       . niacin (NIASPAN) 500 MG CR tablet TAKE 1 TABLET BY MOUTH AT BEDTIME AS DIRECTED AND TAKE 1 ASPIRIN 1/2 HOUR PRIOR TO NIASPAN  30 tablet  12  . potassium chloride SA (K-DUR,KLOR-CON) 20 MEQ tablet Take 1 tablet (20 mEq total) by mouth as needed. Take 20 meq when you take lasix  30 tablet  6  . Psyllium Husk POWD Take 1 Package by mouth 2 (two) times daily. For bowel regularity      . valsartan (DIOVAN) 80 MG tablet Take 80 mg by mouth daily.  No current facility-administered medications on file prior to encounter.    Objective:   Weight Range:  Vital Signs:    Weight change: Filed Vitals:   03/06/14 1404  BP: 128/82  Pulse: 82  Weight: 371 lb 8 oz (168.511 kg)  SpO2: 97%    Physical Exam: General:  Well appearing. No resp difficulty HEENT: normal Neck: supple. JVP flat . Carotids 2+ bilat; no bruits. No lymphadenopathy or thryomegaly appreciated. Cor: PMI nondisplaced. Regular rate & rhythm. No rubs, gallops or murmurs. Lungs: clear Abdomen: Obese, soft, nontender, nondistended. No hepatosplenomegaly. No bruits or masses. Good bowel sounds. Extremities: no cyanosis, clubbing, rash, 2+ edema + varicose veins Neuro: alert & orientedx3, cranial nerves grossly intact. moves all 4 extremities w/o difficulty. Affect pleasant   Imaging: No  results found.  ECG: NSR 62 No ST-T wave abnormalities.    Assessment:   1. PAF in NSR on Tikosyn 2. Recent pericarditis 3. Psoriatic Arthritis  4. Obesity  5. HTN  6. LE edema   Plan/Discussion:    Doing well.   Does have occasional AF breakthroughs which are relatively short-lived and asx. Will continue Tikosyn and Eliquis. At some point I do think these will become frequent enough that he will need repeat ablation with Dr. Berneice Gandy. Discussed possible use of [employer] to monitor.   Lower extremity edema.Likely dependent edema. Wear compression hose. Can try lasix 40 a couple of times and see if it helps. Keep legs elevated.    Agree with Dr. Dierdre Forth that CP is likely costochrondritis.   Shonique Pelphrey,MD 2:45 PM

## 2014-03-06 NOTE — Patient Instructions (Signed)
CHANGE Lasix to 40mg  twice a week, as needed  Your physician recommends that you schedule a follow-up appointment in: 6 months  Do the following things EVERYDAY: 1) Weigh yourself in the morning before breakfast. Write it down and keep it in a log. 2) Take your medicines as prescribed 3) Eat low salt foods-Limit salt (sodium) to 2000 mg per day.  4) Stay as active as you can everyday 5) Limit all fluids for the day to less than 2 liters 6)

## 2014-03-06 NOTE — Telephone Encounter (Signed)
Need direction clarified for furosemide .Marland Kitchen Please advise

## 2014-03-08 NOTE — Addendum Note (Signed)
Encounter addended by: Simon Rhein, CCT on: 03/08/2014 12:36 PM<BR>     Documentation filed: Charges VN

## 2014-04-29 ENCOUNTER — Other Ambulatory Visit (HOSPITAL_COMMUNITY): Payer: Self-pay

## 2014-05-02 NOTE — Telephone Encounter (Signed)
Open in error

## 2014-05-05 ENCOUNTER — Other Ambulatory Visit (HOSPITAL_COMMUNITY): Payer: Self-pay | Admitting: Cardiology

## 2014-05-05 MED ORDER — COLCHICINE 0.6 MG PO CAPS: 1.0000 | ORAL_CAPSULE | Freq: Two times a day (BID) | ORAL | Status: DC

## 2014-05-05 MED ORDER — PANTOPRAZOLE SODIUM 40 MG PO TBEC: 40.0000 mg | DELAYED_RELEASE_TABLET | Freq: Two times a day (BID) | ORAL | Status: DC

## 2014-07-09 ENCOUNTER — Other Ambulatory Visit (HOSPITAL_COMMUNITY): Payer: Self-pay

## 2014-07-09 MED ORDER — APIXABAN 5 MG PO TABS: 5.0000 mg | ORAL_TABLET | Freq: Two times a day (BID) | ORAL | Status: DC

## 2014-07-10 ENCOUNTER — Encounter (HOSPITAL_COMMUNITY): Payer: Self-pay | Admitting: Internal Medicine

## 2014-07-10 ENCOUNTER — Telehealth (HOSPITAL_COMMUNITY): Payer: Self-pay | Admitting: Cardiology

## 2014-07-10 ENCOUNTER — Other Ambulatory Visit: Payer: Self-pay | Admitting: *Deleted

## 2014-07-10 ENCOUNTER — Telehealth (HOSPITAL_COMMUNITY): Payer: Self-pay | Admitting: *Deleted

## 2014-07-10 NOTE — Telephone Encounter (Signed)
Pt scheduled for DCCV on 07/11/2014, cpt code 19379 With pts current insurance- [employer] No pre cert is required

## 2014-07-10 NOTE — Telephone Encounter (Signed)
Pt called and stated he has been in a-fib since Tue night and HR is running 110-130, per Dr Gala Romney sch pt for dccv, pt sch for 12/11 at 11 am, pt aware and instructions reviewed with him via phone

## 2014-07-11 ENCOUNTER — Ambulatory Visit (HOSPITAL_COMMUNITY): Payer: BC Managed Care – PPO | Admitting: Anesthesiology

## 2014-07-11 ENCOUNTER — Ambulatory Visit (HOSPITAL_COMMUNITY)
Admission: RE | Admit: 2014-07-11 | Discharge: 2014-07-11 | Disposition: A | Discharge status: Home / Self Care | Payer: BC Managed Care – PPO | Source: Ambulatory Visit | Attending: Internal Medicine | Admitting: Internal Medicine

## 2014-07-11 ENCOUNTER — Encounter (HOSPITAL_COMMUNITY)
Admission: RE | Disposition: A | Discharge status: Home / Self Care | Payer: Self-pay | Source: Ambulatory Visit | Attending: Internal Medicine

## 2014-07-11 ENCOUNTER — Encounter (HOSPITAL_COMMUNITY): Payer: Self-pay | Admitting: *Deleted

## 2014-07-11 DIAGNOSIS — Z6841 Body Mass Index (BMI) 40.0 and over, adult: Secondary | ICD-10-CM | POA: Insufficient documentation

## 2014-07-11 DIAGNOSIS — R001 Bradycardia, unspecified: Secondary | ICD-10-CM | POA: Diagnosis not present

## 2014-07-11 DIAGNOSIS — I10 Essential (primary) hypertension: Secondary | ICD-10-CM | POA: Diagnosis not present

## 2014-07-11 DIAGNOSIS — K219 Gastro-esophageal reflux disease without esophagitis: Secondary | ICD-10-CM | POA: Insufficient documentation

## 2014-07-11 DIAGNOSIS — Z87891 Personal history of nicotine dependence: Secondary | ICD-10-CM | POA: Insufficient documentation

## 2014-07-11 DIAGNOSIS — I480 Paroxysmal atrial fibrillation: Secondary | ICD-10-CM

## 2014-07-11 DIAGNOSIS — I4891 Unspecified atrial fibrillation: Secondary | ICD-10-CM | POA: Diagnosis present

## 2014-07-11 DIAGNOSIS — L4050 Arthropathic psoriasis, unspecified: Secondary | ICD-10-CM | POA: Insufficient documentation

## 2014-07-11 DIAGNOSIS — I509 Heart failure, unspecified: Secondary | ICD-10-CM | POA: Insufficient documentation

## 2014-07-11 HISTORY — PX: CARDIOVERSION: SHX1299

## 2014-07-11 SURGERY — CARDIOVERSION
Anesthesia: General

## 2014-07-11 MED ORDER — LIDOCAINE HCL (CARDIAC) 20 MG/ML IV SOLN
INTRAVENOUS | Status: DC | PRN
  Administered 2014-07-11: 100 mg via INTRAVENOUS

## 2014-07-11 MED ORDER — SODIUM CHLORIDE 0.9 % IV SOLN
INTRAVENOUS | Status: DC
  Administered 2014-07-11: 500 mL via INTRAVENOUS

## 2014-07-11 MED ORDER — PROPOFOL 10 MG/ML IV BOLUS
INTRAVENOUS | Status: DC | PRN
  Administered 2014-07-11: 70 mg via INTRAVENOUS
  Administered 2014-07-11 (×2): 30 mg via INTRAVENOUS

## 2014-07-11 NOTE — Transfer of Care (Signed)
Immediate Anesthesia Transfer of Care Note  Patient: Mark Gonzalez.  Procedure(s) Performed: Procedure(s): CARDIOVERSION (N/A)  Patient Location: PACU and Endoscopy Unit  Anesthesia Type:General  Level of Consciousness: awake, alert , oriented and patient cooperative  Airway & Oxygen Therapy: Patient Spontanous Breathing and Patient connected to nasal cannula oxygen  Post-op Assessment: Report given to PACU RN, Post -op Vital signs reviewed and stable and Patient moving all extremities  Post vital signs: Reviewed and stable  Complications: No apparent anesthesia complications

## 2014-07-11 NOTE — Discharge Instructions (Signed)
Conscious Sedation, Adult, Care After °Refer to this sheet in the next few weeks. These instructions provide you with information on caring for yourself after your procedure. Your health care provider may also give you more specific instructions. Your treatment has been planned according to current medical practices, but problems sometimes occur. Call your health care provider if you have any problems or questions after your procedure. °WHAT TO EXPECT AFTER THE PROCEDURE  °After your procedure: °· You may feel sleepy, clumsy, and have poor balance for several hours. °· Vomiting may occur if you eat too soon after the procedure. °HOME CARE INSTRUCTIONS °· Do not participate in any activities where you could become injured for at least 24 hours. Do not: °¨ Drive. °¨ Swim. °¨ Ride a bicycle. °¨ Operate heavy machinery. °¨ Cook. °¨ Use power tools. °¨ Climb ladders. °¨ Work from a high place. °· Do not make important decisions or sign legal documents until you are improved. °· If you vomit, drink water, juice, or soup when you can drink without vomiting. Make sure you have little or no nausea before eating solid foods. °· Only take over-the-counter or prescription medicines for pain, discomfort, or fever as directed by your health care provider. °· Make sure you and your family fully understand everything about the medicines given to you, including what side effects may occur. °· You should not drink alcohol, take sleeping pills, or take medicines that cause drowsiness for at least 24 hours. °· If you smoke, do not smoke without supervision. °· If you are feeling better, you may resume normal activities 24 hours after you were sedated. °· Keep all appointments with your health care provider. °SEEK MEDICAL CARE IF: °· Your skin is pale or bluish in color. °· You continue to feel nauseous or vomit. °· Your pain is getting worse and is not helped by medicine. °· You have bleeding or swelling. °· You are still sleepy or  feeling clumsy after 24 hours. °SEEK IMMEDIATE MEDICAL CARE IF: °· You develop a rash. °· You have difficulty breathing. °· You develop any type of allergic problem. °· You have a fever. °MAKE SURE YOU: °· Understand these instructions. °· Will watch your condition. °· Will get help right away if you are not doing well or get worse. °Document Released: 05/08/2013 Document Reviewed: 05/08/2013 °Exit[employer]® Patient Information ©2015 [employer]. This information is not intended to replace advice given to you by your health care provider. Make sure you discuss any questions you have with your health care provider. °Electrical Cardioversion, Care After °Refer to this sheet in the next few weeks. These instructions provide you with information on caring for yourself after your procedure. Your health care provider may also give you more specific instructions. Your treatment has been planned according to current medical practices, but problems sometimes occur. Call your health care provider if you have any problems or questions after your procedure. °WHAT TO EXPECT AFTER THE PROCEDURE °After your procedure, it is typical to have the following sensations: °· Some redness on the skin where the shocks were delivered. If this is tender, a sunburn lotion or hydrocortisone cream may help. °· Possible return of an abnormal heart rhythm within hours or days after the procedure. °HOME CARE INSTRUCTIONS °· Take medicines only as directed by your health care provider. Be sure you understand how and when to take your medicine. °· Learn how to feel your pulse and check it often. °· Limit your activity for 48 hours after   the procedure or as directed by your health care provider. °· Avoid or minimize caffeine and other stimulants as directed by your health care provider. °SEEK MEDICAL CARE IF: °· You feel like your heart is beating too fast or your pulse is not regular. °· You have any questions about your medicines. °· You have bleeding  that will not stop. °SEEK IMMEDIATE MEDICAL CARE IF: °· You are dizzy or feel faint. °· It is hard to breathe or you feel short of breath. °· There is a change in discomfort in your chest. °· Your speech is slurred or you have trouble moving an arm or leg on one side of your body. °· You get a serious muscle cramp that does not go away. °· Your fingers or toes turn cold or blue. °Document Released: 05/08/2013 Document Revised: 12/02/2013 Document Reviewed: 05/08/2013 °Exit[employer]® Patient Information ©2015 [employer]. This information is not intended to replace advice given to you by your health care provider. Make sure you discuss any questions you have with your health care provider. ° °

## 2014-07-11 NOTE — CV Procedure (Signed)
     DIRECT CURRENT CARDIOVERSION  NAME:  Mark Gonzalez.   MRN: [id number] DOB:  1961/06/01   ADMIT DATE: 07/11/2014   INDICATIONS: Atrial fibrillation    PROCEDURE:   Informed consent was obtained prior to the procedure. The risks, benefits and alternatives for the procedure were discussed and the patient comprehended these risks. Once an appropriate time out was taken, the patient had the defibrillator pads placed in the anterior and posterior position. The patient then underwent sedation by the anesthesia service. Once an appropriate level of sedation was achieved with propofol 130 mg x1, the patient received a single biphasic, synchronized 200J shock with prompt conversion to sinus rhythm. No apparent complications.   CONLCUSION:   1.  Successful DC-CV  Reuel Boom Bensimhon,MD 11:26 AM

## 2014-07-11 NOTE — Anesthesia Postprocedure Evaluation (Signed)
  Anesthesia Post-op Note  Patient: Mark Gonzalez.  Procedure(s) Performed: Procedure(s): CARDIOVERSION (N/A)  Patient Location: Endoscopy Unit  Anesthesia Type:MAC  Level of Consciousness: awake and alert   Airway and Oxygen Therapy: Patient Spontanous Breathing  Post-op Pain: none  Post-op Assessment: Post-op Vital signs reviewed, Patient's Cardiovascular Status Stable, Respiratory Function Stable, Patent Airway, No signs of Nausea or vomiting and Pain level controlled  Post-op Vital Signs: Reviewed and stable  Last Vitals:  Filed Vitals:   07/11/14 1131  BP: 125/78  Pulse: 63  Temp:   Resp: 13    Complications: No apparent anesthesia complications

## 2014-07-11 NOTE — Anesthesia Postprocedure Evaluation (Signed)
  Anesthesia Post-op Note  Patient: Mark Gonzalez.  Procedure(s) Performed: Procedure(s): CARDIOVERSION (N/A)  Patient Location: Endoscopy Unit  Anesthesia Type:General  Level of Consciousness: awake and alert   Airway and Oxygen Therapy: Patient Spontanous Breathing  Post-op Pain: none  Post-op Assessment: Post-op Vital signs reviewed, Patient's Cardiovascular Status Stable, Respiratory Function Stable, Patent Airway, No signs of Nausea or vomiting and Pain level controlled  Post-op Vital Signs: Reviewed and stable  Last Vitals:  Filed Vitals:   07/11/14 1150  BP: 147/97  Pulse: 61  Temp:   Resp: 19    Complications: No apparent anesthesia complications

## 2014-07-11 NOTE — H&P (Addendum)
ADVANCED HEART FAILURE ADMIT H& P   HPI: Koren BoundWally is a very pleasant 53 y/o male with a history of psoriatic arthritis, obesity, hypertension, and atrial fibrillation. In 2009 he was admitted for Tikosyn load followed by successful DC-CV. He continued on 500 mcg Tikosyn bid but later went back into A fib. He was referred to [facility] for an ablation. He had AF ablation at [facility] and was maintained in NSR on flecainide but went back into A fib in October 2014. At that time Joice Loftstikosyn was started November 2014 followed by successful DC-CV 06/03/14. He had a recurrence pf A fib in December but then went back into NSR. He has seen Dr Deno LungerHranitzky with plan for an ablation. Over the last year he has required 3 cardioversions.  Went back into AF on Tuesday and persistent fatigue and palpitations. Here for DC-CV. Has not missed Eliquis.   Review of Systems:    Cardiac Review of Systems: {Y] = yes [ ]  = no  Chest Pain [    ]  Resting SOB [   ] Exertional SOB  [ Y ]  Orthopnea [  ]   Pedal Edema [   ]    Palpitations [Y  ] Syncope  [  ]   Presyncope [   ]  General Review of Systems: [Y] = yes [  ]=no Constitional: recent weight change [  ]; anorexia [  ]; fatigue [ Y ]; nausea [  ]; night sweats [  ]; fever [  ]; or chills [  ];                                                                                                                                          Dental: poor dentition[  ]; Last Dentist visit:   Eye : blurred vision [  ]; diplopia [   ]; vision changes [  ];  Amaurosis fugax[  ]; Resp: cough [  ];  wheezing[  ];  hemoptysis[  ]; shortness of breath[Y  ]; paroxysmal nocturnal dyspnea[  ]; dyspnea on exertion[Y  ]; or orthopnea[Y  ];  GI:  gallstones[  ], vomiting[  ];  dysphagia[  ]; melena[  ];  hematochezia [  ]; heartburn[  ];   Hx of  Colonoscopy[  ];  GU: kidney stones [  ]; hematuria[  ];   dysuria [  ];  nocturia[  ];  history of     obstruction [  ];                 Skin: rash, swelling[  ];, hair  loss[  ];  peripheral edema[  ];  or itching[  ]; Musculosketetal: myalgias[  ];  joint swelling[  ];  joint erythema[  ];  joint pain[Y  ];  back pain[  ];  Heme/Lymph: bruising[  ];  bleeding[  ];  anemia[  ];  Neuro: TIA[  ];  headaches[  ];  stroke[  ];  vertigo[  ];  seizures[  ];   paresthesias[  ];  difficulty walking[  ];  Psych:depression[ Y ]; anxiety[  ];  Endocrine: diabetes[  ];  thyroid dysfunction[  ];  Immunizations: Flu [  ]; Pneumococcal[  ];  Other:  Past Medical History  Diagnosis Date  . Paroxysmal atrial fibrillation     a. s/p DCCV 09/2007, 11/2007, 04/2013 --> flecainide and chronic coumadin.  . Morbid obesity   . Hypertension   . Psoriatic arthritis   . Cellulitis   . Atrial flutter     a. s/p DCCV 08/16/2012.  Marland Kitchen NICM (nonischemic cardiomyopathy)     a. EF 45-50% in setting of AF in past; b. Neg MV;  c. 04/2010 Echo: EF 55-60%;  d. 11/2011 Cath: nl cors.    Medications Prior to Admission  Medication Sig Dispense Refill  . apixaban (ELIQUIS) 5 MG TABS tablet Take 1 tablet (5 mg total) by mouth 2 (two) times daily. 60 tablet 6  . ARTHROTEC 75-0.2 MG TBEC Take 1 tablet by mouth 2 (two) times daily.   0  . Certolizumab Pegol (CIMZIA Harrison) Inject 200 mg into the skin every 14 (fourteen) days. Every other Friday    . cholecalciferol (VITAMIN D) 1000 UNITS tablet Take 1,000 Units by mouth every morning.     . Colchicine 0.6 MG CAPS Take 1 capsule by mouth 2 (two) times daily. 60 capsule 6  . dofetilide (TIKOSYN) 500 MCG capsule Take 1 capsule (500 mcg total) by mouth every 12 (twelve) hours. 60 capsule 6  . fish oil-omega-3 fatty acids 1000 MG capsule Take 1 g by mouth 2 (two) times daily.     . furosemide (LASIX) 20 MG tablet Take 20 mg by mouth daily.     Marland Kitchen lovastatin (MEVACOR) 20 MG tablet take 1 tablet by mouth at bedtime 30 tablet 7  . MAGNESIUM PO Take 1 tablet by mouth daily.    . Multiple Vitamin (MULTIVITAMIN) tablet Take 1 tablet by mouth every morning.     .  niacin (NIASPAN) 500 MG CR tablet TAKE 1 TABLET BY MOUTH AT BEDTIME AS DIRECTED AND TAKE 1 ASPIRIN 1/2 HOUR PRIOR TO NIASPAN 30 tablet 12  . pantoprazole (PROTONIX) 40 MG tablet Take 1 tablet (40 mg total) by mouth 2 (two) times daily before a meal. 60 tablet 3  . potassium chloride SA (K-DUR,KLOR-CON) 20 MEQ tablet Take 1 tablet (20 mEq total) by mouth as needed. Take 20 meq when you take lasix (Patient taking differently: Take 20 mEq by mouth as needed (when you take lasix). ) 30 tablet 6  . Psyllium Husk POWD Take 1 Package by mouth 2 (two) times daily. For bowel regularity    . valsartan (DIOVAN) 80 MG tablet take 1 tablet by mouth once daily 30 tablet 6  . furosemide (LASIX) 40 MG tablet Take 1 tablet (40 mg total) by mouth 2 (two) times a week. (Patient not taking: Reported on 07/10/2014) 30 tablet 6     Allergies  Allergen Reactions  . Lisinopril Other (See Comments)    cough    History   Social History  . Marital Status: Married    Spouse Name: N/A    Number of Children: N/A  . Years of Education: N/A   Occupational History  . Not on file.   Social History Main Topics  . Smoking status: Former  Smoker    Types: Cigarettes    Quit date: 08/01/1990  . Smokeless tobacco: Not on file  . Alcohol Use: 1.8 - 2.4 oz/week    3-4 Glasses of wine per week  . Drug Use: No  . Sexual Activity: Not on file   Other Topics Concern  . Not on file   Social History Narrative   He is married to his wife for 20 years.  He has one child.  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      Family History  Problem Relation Age of Onset  . Heart disease Neg Hx     Physical Exam:  General:  No resp difficulty Wife present HEENT: normal  Neck: supple. JVP flat . Carotids 2+ bilat; no bruits. No lymphadenopathy or thryomegaly appreciated.  Cor: PMI nondisplaced.  Irregular rate & rhythm. No rubs, gallops or  murmurs.  Lungs: clear  Abdomen: Obese, soft, nontender, nondistended. No hepatosplenomegaly. No bruits or masses. Good bowel sounds.  Extremities: no cyanosis, clubbing, rash, edema  Neuro: alert & orientedx3, cranial nerves grossly intact. moves all 4 extremities w/o difficulty. Affect pleasant    ECG: A fib 94 bpm    No results found for this or any previous visit (from the past 24 hour(s)). No results found.   ASSESSMENT: 1. Recurrent AF on Tikosyn and Eliquis 2. Psoriatic Arthritis 3. Obesity  Plan/Discussion:    Proceed with DC-CV  Hudsyn Barich,MD 11:21 AM

## 2014-07-11 NOTE — Anesthesia Preprocedure Evaluation (Addendum)
Anesthesia Evaluation  Patient identified by MRN, date of birth, ID band  Reviewed: Allergy & Precautions, H&P , NPO status , Patient's Chart, lab work & pertinent test results, reviewed documented beta blocker date and time   History of Anesthesia Complications Negative for: history of anesthetic complications  Airway Mallampati: II  TM Distance: >3 FB Neck ROM: Full    Dental  (+) Teeth Intact, Caps   Pulmonary former smoker,  breath sounds clear to auscultation        Cardiovascular hypertension, Pt. on medications - angina- CHF + dysrhythmias Atrial Fibrillation Rhythm:Irregular     Neuro/Psych negative neurological ROS  negative psych ROS   GI/Hepatic Neg liver ROS, GERD-  Medicated and Controlled,  Endo/Other  Morbid obesity  Renal/GU negative Renal ROS     Musculoskeletal   Abdominal   Peds  Hematology eliquis   Anesthesia Other Findings   Reproductive/Obstetrics                           Anesthesia Physical Anesthesia Plan  ASA: III  Anesthesia Plan: General   Post-op Pain Management:    Induction: Intravenous  Airway Management Planned: Mask  Additional Equipment: None  Intra-op Plan:   Post-operative Plan:   Informed Consent: I have reviewed the patients History and Physical, chart, labs and discussed the procedure including the risks, benefits and alternatives for the proposed anesthesia with the patient or authorized representative who has indicated his/her understanding and acceptance.   Dental advisory given  Plan Discussed with: CRNA and Surgeon  Anesthesia Plan Comments:        Anesthesia Quick Evaluation

## 2014-07-14 ENCOUNTER — Encounter (HOSPITAL_COMMUNITY): Payer: Self-pay | Admitting: Internal Medicine

## 2014-08-04 ENCOUNTER — Other Ambulatory Visit (HOSPITAL_COMMUNITY): Payer: Self-pay | Admitting: Internal Medicine

## 2014-08-05 ENCOUNTER — Other Ambulatory Visit (HOSPITAL_COMMUNITY): Payer: Self-pay | Admitting: Cardiology

## 2014-08-05 MED ORDER — POTASSIUM CHLORIDE CRYS ER 20 MEQ PO TBCR: 20.0000 meq | EXTENDED_RELEASE_TABLET | ORAL | Status: DC | PRN

## 2014-09-01 ENCOUNTER — Other Ambulatory Visit (HOSPITAL_COMMUNITY): Payer: Self-pay

## 2014-09-01 MED ORDER — FUROSEMIDE 20 MG PO TABS: 20.0000 mg | ORAL_TABLET | Freq: Every day | ORAL | Status: DC

## 2014-09-01 MED ORDER — PANTOPRAZOLE SODIUM 40 MG PO TBEC: 40.0000 mg | DELAYED_RELEASE_TABLET | Freq: Two times a day (BID) | ORAL | Status: DC

## 2014-09-09 ENCOUNTER — Other Ambulatory Visit (HOSPITAL_COMMUNITY): Payer: Self-pay | Admitting: Cardiology

## 2014-09-09 MED ORDER — DOFETILIDE 500 MCG PO CAPS: 500.0000 ug | ORAL_CAPSULE | Freq: Two times a day (BID) | ORAL | Status: DC

## 2014-09-30 DIAGNOSIS — I82409 Acute embolism and thrombosis of unspecified deep veins of unspecified lower extremity: Secondary | ICD-10-CM

## 2014-09-30 HISTORY — DX: Acute embolism and thrombosis of unspecified deep veins of unspecified lower extremity: I82.409

## 2014-10-08 ENCOUNTER — Other Ambulatory Visit: Payer: Self-pay

## 2014-10-08 MED ORDER — VALSARTAN 80 MG PO TABS: 80.0000 mg | ORAL_TABLET | Freq: Every day | ORAL | Status: DC

## 2014-12-10 ENCOUNTER — Other Ambulatory Visit (HOSPITAL_COMMUNITY): Payer: Self-pay | Admitting: *Deleted

## 2014-12-10 MED ORDER — COLCHICINE 0.6 MG PO CAPS: 1.0000 | ORAL_CAPSULE | Freq: Two times a day (BID) | ORAL | Status: DC

## 2014-12-12 ENCOUNTER — Other Ambulatory Visit (HOSPITAL_COMMUNITY): Payer: Self-pay | Admitting: *Deleted

## 2014-12-12 MED ORDER — NIACIN ER (ANTIHYPERLIPIDEMIC) 500 MG PO TBCR: EXTENDED_RELEASE_TABLET | ORAL | Status: DC

## 2014-12-30 ENCOUNTER — Other Ambulatory Visit (HOSPITAL_COMMUNITY): Payer: Self-pay | Admitting: *Deleted

## 2014-12-30 MED ORDER — PANTOPRAZOLE SODIUM 40 MG PO TBEC: 40.0000 mg | DELAYED_RELEASE_TABLET | Freq: Two times a day (BID) | ORAL | Status: DC

## 2015-01-21 ENCOUNTER — Other Ambulatory Visit: Payer: Self-pay | Admitting: *Deleted

## 2015-01-21 ENCOUNTER — Telehealth (HOSPITAL_COMMUNITY): Payer: Self-pay | Admitting: *Deleted

## 2015-01-21 NOTE — Telephone Encounter (Signed)
Pt called Dr Gala Romney 6/21 stating he was back in a-fib, per Dr Gala Romney pt needs DCCV, unable to sch today due to anesthesia and endo schedules, have sch for [facility] 6/23 at 10 am, pt aware and instructions reviewed

## 2015-01-22 ENCOUNTER — Encounter (HOSPITAL_COMMUNITY): Payer: Self-pay | Admitting: Certified Registered Nurse Anesthetist

## 2015-01-22 ENCOUNTER — Ambulatory Visit (HOSPITAL_COMMUNITY)
Admission: RE | Admit: 2015-01-22 | Payer: BLUE CROSS/BLUE SHIELD | Source: Ambulatory Visit | Admitting: Internal Medicine

## 2015-01-22 ENCOUNTER — Encounter (HOSPITAL_COMMUNITY): Admission: RE | Payer: Self-pay | Source: Ambulatory Visit

## 2015-01-22 SURGERY — CARDIOVERSION
Anesthesia: Monitor Anesthesia Care

## 2015-01-22 NOTE — Progress Notes (Signed)
Patient called this morning stating he had been in touch with Dr. Gala Romney and that he had self converted back into NSR. Patient cancelled from schedule.

## 2015-02-03 ENCOUNTER — Other Ambulatory Visit (HOSPITAL_COMMUNITY): Payer: Self-pay | Admitting: *Deleted

## 2015-02-03 MED ORDER — APIXABAN 5 MG PO TABS: 5.0000 mg | ORAL_TABLET | Freq: Two times a day (BID) | ORAL | Status: DC

## 2015-03-02 ENCOUNTER — Other Ambulatory Visit (HOSPITAL_COMMUNITY): Payer: Self-pay | Admitting: *Deleted

## 2015-03-05 ENCOUNTER — Other Ambulatory Visit (HOSPITAL_COMMUNITY): Payer: Self-pay | Admitting: *Deleted

## 2015-03-05 MED ORDER — POTASSIUM CHLORIDE CRYS ER 20 MEQ PO TBCR: 40.0000 meq | EXTENDED_RELEASE_TABLET | ORAL | Status: DC

## 2015-03-30 ENCOUNTER — Other Ambulatory Visit (HOSPITAL_COMMUNITY): Payer: Self-pay | Admitting: *Deleted

## 2015-03-30 MED ORDER — DOFETILIDE 500 MCG PO CAPS: 500.0000 ug | ORAL_CAPSULE | Freq: Two times a day (BID) | ORAL | Status: DC

## 2015-03-31 ENCOUNTER — Other Ambulatory Visit (HOSPITAL_COMMUNITY): Payer: Self-pay | Admitting: *Deleted

## 2015-03-31 MED ORDER — LOVASTATIN 20 MG PO TABS: 20.0000 mg | ORAL_TABLET | Freq: Every day | ORAL | Status: DC

## 2015-04-28 ENCOUNTER — Other Ambulatory Visit (HOSPITAL_COMMUNITY): Payer: Self-pay | Admitting: *Deleted

## 2015-04-28 ENCOUNTER — Other Ambulatory Visit: Payer: Self-pay | Admitting: Cardiology

## 2015-04-28 MED ORDER — PANTOPRAZOLE SODIUM 40 MG PO TBEC: 40.0000 mg | DELAYED_RELEASE_TABLET | Freq: Two times a day (BID) | ORAL | Status: DC

## 2015-07-07 ENCOUNTER — Other Ambulatory Visit (HOSPITAL_COMMUNITY): Payer: Self-pay | Admitting: *Deleted

## 2015-07-07 MED ORDER — COLCHICINE 0.6 MG PO CAPS: 1.0000 | ORAL_CAPSULE | Freq: Two times a day (BID) | ORAL | Status: DC

## 2015-07-24 ENCOUNTER — Telehealth (HOSPITAL_COMMUNITY): Payer: Self-pay | Admitting: *Deleted

## 2015-07-24 NOTE — Telephone Encounter (Signed)
Pt called to report he has been having some chest discomfort for past couple of days, he states it feel a little like it did when he had pericarditis in the past.  Discussed w/Dr Bensimhon he would like pt to start taking Ibuprofen 600 mg 2-3 times a day to see if this helps, if not should call back over weekend if worse.  Pt aware and agreeable

## 2015-08-20 ENCOUNTER — Other Ambulatory Visit (HOSPITAL_COMMUNITY): Payer: Self-pay | Admitting: *Deleted

## 2015-08-20 MED ORDER — FUROSEMIDE 20 MG PO TABS: 20.0000 mg | ORAL_TABLET | Freq: Every day | ORAL | Status: DC

## 2015-09-01 ENCOUNTER — Other Ambulatory Visit (HOSPITAL_COMMUNITY): Payer: Self-pay | Admitting: *Deleted

## 2015-09-01 MED ORDER — PANTOPRAZOLE SODIUM 40 MG PO TBEC: 40.0000 mg | DELAYED_RELEASE_TABLET | Freq: Two times a day (BID) | ORAL | Status: DC

## 2015-09-01 MED ORDER — APIXABAN 5 MG PO TABS: 5.0000 mg | ORAL_TABLET | Freq: Two times a day (BID) | ORAL | Status: DC

## 2015-09-24 ENCOUNTER — Encounter (HOSPITAL_COMMUNITY): Payer: Self-pay | Admitting: Family Medicine

## 2015-09-24 ENCOUNTER — Inpatient Hospital Stay (HOSPITAL_COMMUNITY)
Admission: EM | Admit: 2015-09-24 | Discharge: 2015-09-27 | DRG: 603 | Disposition: A | Discharge status: Home / Self Care | Payer: BLUE CROSS/BLUE SHIELD | Attending: Internal Medicine | Admitting: Internal Medicine

## 2015-09-24 DIAGNOSIS — I429 Cardiomyopathy, unspecified: Secondary | ICD-10-CM | POA: Diagnosis present

## 2015-09-24 DIAGNOSIS — I10 Essential (primary) hypertension: Secondary | ICD-10-CM | POA: Diagnosis present

## 2015-09-24 DIAGNOSIS — Z79899 Other long term (current) drug therapy: Secondary | ICD-10-CM | POA: Diagnosis not present

## 2015-09-24 DIAGNOSIS — Z6841 Body Mass Index (BMI) 40.0 and over, adult: Secondary | ICD-10-CM | POA: Diagnosis not present

## 2015-09-24 DIAGNOSIS — Z7901 Long term (current) use of anticoagulants: Secondary | ICD-10-CM

## 2015-09-24 DIAGNOSIS — B9689 Other specified bacterial agents as the cause of diseases classified elsewhere: Secondary | ICD-10-CM | POA: Diagnosis present

## 2015-09-24 DIAGNOSIS — L02419 Cutaneous abscess of limb, unspecified: Secondary | ICD-10-CM

## 2015-09-24 DIAGNOSIS — L03116 Cellulitis of left lower limb: Secondary | ICD-10-CM | POA: Diagnosis present

## 2015-09-24 DIAGNOSIS — Z87891 Personal history of nicotine dependence: Secondary | ICD-10-CM | POA: Diagnosis not present

## 2015-09-24 DIAGNOSIS — I481 Persistent atrial fibrillation: Secondary | ICD-10-CM | POA: Diagnosis not present

## 2015-09-24 DIAGNOSIS — I480 Paroxysmal atrial fibrillation: Secondary | ICD-10-CM | POA: Diagnosis present

## 2015-09-24 DIAGNOSIS — L4050 Arthropathic psoriasis, unspecified: Secondary | ICD-10-CM | POA: Diagnosis present

## 2015-09-24 DIAGNOSIS — L03119 Cellulitis of unspecified part of limb: Secondary | ICD-10-CM | POA: Diagnosis present

## 2015-09-24 DIAGNOSIS — M1990 Unspecified osteoarthritis, unspecified site: Secondary | ICD-10-CM | POA: Diagnosis present

## 2015-09-24 DIAGNOSIS — Z872 Personal history of diseases of the skin and subcutaneous tissue: Secondary | ICD-10-CM

## 2015-09-24 DIAGNOSIS — I4891 Unspecified atrial fibrillation: Secondary | ICD-10-CM | POA: Diagnosis present

## 2015-09-24 HISTORY — DX: Pneumonia, unspecified organism: J18.9

## 2015-09-24 HISTORY — DX: Personal history of diseases of the skin and subcutaneous tissue: Z87.2

## 2015-09-24 HISTORY — DX: Acute embolism and thrombosis of unspecified deep veins of unspecified lower extremity: I82.409

## 2015-09-24 HISTORY — DX: Pure hypercholesterolemia, unspecified: E78.00

## 2015-09-24 HISTORY — DX: Cellulitis of left lower limb: L03.116

## 2015-09-24 HISTORY — DX: Psoriasis, unspecified: L40.9

## 2015-09-24 LAB — I-STAT CHEM 8, ED
BUN: 18 mg/dL (ref 6–20)
CALCIUM ION: 1.15 mmol/L (ref 1.12–1.23)
CREATININE: 0.8 mg/dL (ref 0.61–1.24)
Chloride: 102 mmol/L (ref 101–111)
GLUCOSE: 102 mg/dL — AB (ref 65–99)
HCT: 53 % — ABNORMAL HIGH (ref 39.0–52.0)
HEMOGLOBIN: 18 g/dL — AB (ref 13.0–17.0)
POTASSIUM: 3.4 mmol/L — AB (ref 3.5–5.1)
Sodium: 144 mmol/L (ref 135–145)
TCO2: 25 mmol/L (ref 0–100)

## 2015-09-24 LAB — CBC WITH DIFFERENTIAL/PLATELET
BASOS ABS: 0 10*3/uL (ref 0.0–0.1)
Basophils Relative: 0 %
Eosinophils Absolute: 0.1 10*3/uL (ref 0.0–0.7)
Eosinophils Relative: 2 %
HEMATOCRIT: 42.8 % (ref 39.0–52.0)
HEMOGLOBIN: 14.5 g/dL (ref 13.0–17.0)
LYMPHS ABS: 1.6 10*3/uL (ref 0.7–4.0)
LYMPHS PCT: 46 %
MCH: 29.9 pg (ref 26.0–34.0)
MCHC: 33.9 g/dL (ref 30.0–36.0)
MCV: 88.2 fL (ref 78.0–100.0)
MONO ABS: 0.3 10*3/uL (ref 0.1–1.0)
Monocytes Relative: 8 %
Neutro Abs: 1.6 10*3/uL — ABNORMAL LOW (ref 1.7–7.7)
Neutrophils Relative %: 44 %
Platelets: 156 10*3/uL (ref 150–400)
RBC: 4.85 MIL/uL (ref 4.22–5.81)
RDW: 13.4 % (ref 11.5–15.5)
WBC: 3.6 10*3/uL — ABNORMAL LOW (ref 4.0–10.5)

## 2015-09-24 LAB — I-STAT TROPONIN, ED: Troponin i, poc: 0 ng/mL (ref 0.00–0.08)

## 2015-09-24 LAB — MAGNESIUM: MAGNESIUM: 2 mg/dL (ref 1.7–2.4)

## 2015-09-24 MED ORDER — HYDROCODONE-ACETAMINOPHEN 5-325 MG PO TABS
1.0000 | ORAL_TABLET | ORAL | Status: DC | PRN
  Administered 2015-09-25: 2 via ORAL
  Administered 2015-09-25: 1 via ORAL
  Administered 2015-09-25 (×3): 2 via ORAL
  Administered 2015-09-25: 1 via ORAL
  Filled 2015-09-24: qty 1
  Filled 2015-09-24 (×4): qty 2
  Filled 2015-09-24: qty 1

## 2015-09-24 MED ORDER — CEFAZOLIN SODIUM-DEXTROSE 2-3 GM-% IV SOLR
2.0000 g | Freq: Three times a day (TID) | INTRAVENOUS | Status: DC
  Administered 2015-09-25 – 2015-09-26 (×4): 2 g via INTRAVENOUS
  Filled 2015-09-24 (×6): qty 50

## 2015-09-24 MED ORDER — VANCOMYCIN HCL IN DEXTROSE 1-5 GM/200ML-% IV SOLN
1000.0000 mg | Freq: Once | INTRAVENOUS | Status: AC
  Administered 2015-09-24: 1000 mg via INTRAVENOUS
  Filled 2015-09-24: qty 200

## 2015-09-24 NOTE — ED Provider Notes (Signed)
CSN: [id number]     Arrival date & time 09/24/15  1228 History   First MD Initiated Contact with Patient 09/24/15 1926     Chief Complaint  Patient presents with  . Cellulitis     (Consider location/radiation/quality/duration/timing/severity/associated sxs/prior Treatment) HPI Comments: Patient with a history of atrial fibrillation, on Tikosyn and Eliquis as well as psoriatic arthritis, on Cimzia, presents with worsening redness to his left leg. He's had a history of cellulitis in the past. He notes a 7 day history of worsening redness to his left lower leg. He was seen by his rheumatologist who started him on Keflex 750 mg twice a day. This started about one week ago. 2 days later he had no improvement and started taking the Keflex 3 times a day. He still did not have any improvement and started doxycycline. Since yesterday he's noted that the redness has extended around his leg and is slowly progressing toward his knee. He denies any known fevers. He denies any other recent illnesses.   Past Medical History  Diagnosis Date  . Paroxysmal atrial fibrillation ([place])     a. s/p DCCV 09/2007, 11/2007, 04/2013 --> flecainide and chronic coumadin.  . Morbid obesity ([place])   . Hypertension   . Psoriatic arthritis ([place])   . Cellulitis   . Atrial flutter ([place])     a. s/p DCCV 08/16/2012.  Marland Kitchen NICM (nonischemic cardiomyopathy) ([place])     a. EF 45-50% in setting of AF in past; b. Neg MV;  c. 04/2010 Echo: EF 55-60%;  d. 11/2011 Cath: nl cors.   Past Surgical History  Procedure Laterality Date  . Ablation    . Cardioversion    . Appendectomy    . Foot surgery    . Cardioversion  08/16/2012    Procedure: CARDIOVERSION;  Surgeon: Jolaine Artist, MD;  Location: [facility] ENDOSCOPY;  Service: Cardiovascular;  Laterality: N/A;  . Cardioversion N/A 06/05/2013    Procedure: CARDIOVERSION;  Surgeon: Jolaine Artist, MD;  Location: [facility] OR;  Service: Cardiovascular;  Laterality: N/A;  . Left heart catheterization  with coronary angiogram N/A 12/22/2011    Procedure: LEFT HEART CATHETERIZATION WITH CORONARY ANGIOGRAM;  Surgeon: Jolaine Artist, MD;  Location: [facility] CATH LAB;  Service: Cardiovascular;  Laterality: N/A;  . Cardioversion N/A 07/11/2014    Procedure: CARDIOVERSION;  Surgeon: Jolaine Artist, MD;  Location: [facility] ENDOSCOPY;  Service: Cardiovascular;  Laterality: N/A;   Family History  Problem Relation Age of Onset  . Heart disease Neg Hx    Social History  Substance Use Topics  . Smoking status: Former Smoker    Types: Cigarettes    Quit date: 08/01/1990  . Smokeless tobacco: None  . Alcohol Use: 1.8 - 2.4 oz/week    3-4 Glasses of wine per week    Review of Systems  Constitutional: Negative for fever, chills, diaphoresis and fatigue.  HENT: Negative for congestion, rhinorrhea and sneezing.   Eyes: Negative.   Respiratory: Negative for cough, chest tightness and shortness of breath.   Cardiovascular: Negative for chest pain and leg swelling.  Gastrointestinal: Negative for nausea, vomiting, abdominal pain, diarrhea and blood in stool.  Genitourinary: Negative for frequency, hematuria, flank pain and difficulty urinating.  Musculoskeletal: Negative for back pain and arthralgias.  Skin: Positive for color change. Negative for rash.  Neurological: Negative for dizziness, speech difficulty, weakness, numbness and headaches.      Allergies  Lisinopril  Home Medications   Prior to Admission medications  Medication Sig Start Date End Date Taking? Authorizing Provider  apixaban (ELIQUIS) 5 MG TABS tablet Take 1 tablet (5 mg total) by mouth 2 (two) times daily. 09/01/15   Shaune Pascal Bensimhon, MD  cephALEXin (KEFLEX) 750 MG capsule Take 750 mg by mouth 2 (two) times daily. 09/17/15   Historical Provider, MD  Certolizumab Pegol (CIMZIA PREFILLED) 2 X 200 MG/ML KIT Inject 200 mg into the skin every 14 (fourteen) days. Last inject 01/09/15    Historical Provider, MD  cholecalciferol  (VITAMIN D) 1000 UNITS tablet Take 1,000 Units by mouth daily.     Historical Provider, MD  Colchicine 0.6 MG CAPS Take 1 capsule by mouth 2 (two) times daily. 07/07/15   Jolaine Artist, MD  diclofenac (VOLTAREN) 75 MG EC tablet Take 75 mg by mouth 2 (two) times daily. 09/17/15   Historical Provider, MD  Diclofenac-Misoprostol (ARTHROTEC) 75-0.2 MG TBEC Take 75 mg by mouth 2 (two) times daily.    Historical Provider, MD  dofetilide (TIKOSYN) 500 MCG capsule Take 1 capsule (500 mcg total) by mouth every 12 (twelve) hours. 03/30/15   Jolaine Artist, MD  doxycycline (VIBRA-TABS) 100 MG tablet Take 100 mg by mouth every 12 (twelve) hours. 09/22/15   Historical Provider, MD  fish oil-omega-3 fatty acids 1000 MG capsule Take 1 g by mouth 2 (two) times daily.     Historical Provider, MD  folic acid (FOLVITE) 1 MG tablet Take 1 mg by mouth at bedtime.  12/05/14   Historical Provider, MD  furosemide (LASIX) 20 MG tablet Take 1 tablet (20 mg total) by mouth daily. 08/20/15   Jolaine Artist, MD  lovastatin (MEVACOR) 20 MG tablet Take 1 tablet (20 mg total) by mouth at bedtime. 03/31/15   Jolaine Artist, MD  Magnesium 400 MG CAPS Take 400 mg by mouth daily.    Historical Provider, MD  methotrexate 50 MG/2ML injection Inject 25 mg into the skin once a week. On Mondays 01/01/15   Historical Provider, MD  Multiple Vitamin (MULTIVITAMIN WITH MINERALS) TABS tablet Take 1 tablet by mouth daily.    Historical Provider, MD  niacin (NIASPAN) 500 MG CR tablet TAKE 1 TABLET BY MOUTH AT BEDTIME AS DIRECTED AND TAKE 1 ASPIRIN 1/2 HOUR PRIOR TO NIASPAN Patient taking differently: Take 500 mg by mouth at bedtime.  12/12/14   Jolaine Artist, MD  pantoprazole (PROTONIX) 40 MG tablet Take 1 tablet (40 mg total) by mouth 2 (two) times daily before a meal. 09/01/15   Jolaine Artist, MD  potassium chloride SA (K-DUR,KLOR-CON) 20 MEQ tablet Take 2 tablets (40 mEq total) by mouth 2 (two) times a week. On Monday and  Thursday nights 03/05/15   Jolaine Artist, MD  Probiotic Product (PROBIOTIC PO) Take 1 tablet by mouth daily.    Historical Provider, MD  valsartan (DIOVAN) 80 MG tablet take 1 tablet by mouth once daily 04/29/15   Larey Dresser, MD   BP 140/57 mmHg  Pulse 61  Temp(Src) 97.9 F (36.6 C) (Oral)  Resp 16  SpO2 99% Physical Exam  Constitutional: He is oriented to person, place, and time. He appears well-developed and well-nourished.  HENT:  Head: Normocephalic and atraumatic.  Eyes: Pupils are equal, round, and reactive to light.  Neck: Normal range of motion. Neck supple.  Cardiovascular: Normal rate, regular rhythm and normal heart sounds.   Pulmonary/Chest: Effort normal and breath sounds normal. No respiratory distress. He has no wheezes. He has no rales.  He exhibits no tenderness.  Abdominal: Soft. Bowel sounds are normal. There is no tenderness. There is no rebound and no guarding.  Musculoskeletal: Normal range of motion. He exhibits no edema.  Bilateral lower extremity edema which is chronic per patient. He has circumferential redness to the left lower leg extending from just proximal to the ankle to just distal to the knee. There is no induration or fluctuance. No wounds are noted. He has normal sensation motor function and pulses distally.  Lymphadenopathy:    He has no cervical adenopathy.  Neurological: He is alert and oriented to person, place, and time.  Skin: Skin is warm and dry. No rash noted.  Psychiatric: He has a normal mood and affect.    ED Course  Procedures (including critical care time) Labs Review Labs Reviewed  CBC WITH DIFFERENTIAL/PLATELET - Abnormal; Notable for the following:    WBC 3.6 (*)    Neutro Abs 1.6 (*)    All other components within normal limits  I-STAT CHEM 8, ED - Abnormal; Notable for the following:    Potassium 3.4 (*)    Glucose, Bld 102 (*)    Hemoglobin 18.0 (*)    HCT 53.0 (*)    All other components within normal limits   I-STAT TROPOININ, ED    Imaging Review No results found. I have personally reviewed and evaluated these images and lab results as part of my medical decision-making.   EKG Interpretation None      MDM   Final diagnoses:  Cellulitis of left lower extremity   patient has cellulitis of his left lower leg. He's failed outpatient treatment. He was given vancomycin in the ED. He has no evidence of sepsis. I spoke with the hospitalist, Dr. Jonnie Finner, who will admit patient for further treatment.    Malvin Johns, MD 09/24/15 2236

## 2015-09-24 NOTE — ED Notes (Signed)
Pt here for cellulitis to LLE. sts he has been on abx x 1 week and not better.

## 2015-09-24 NOTE — H&P (Signed)
[facility] History and Physical  [place]. UEA:[id number] DOB: Nov 17, 1960 DOA: 09/24/2015  Referring physician: Dr Tamera Punt PCP: Glori Bickers, MD   Chief Complaint: redness and swelling LLE  HPI: Mark Gonzalez. is a 55 y.o. male with hx of obesity, afib,  HTN and psoriatic arthritis.  He presents with LLE erythema, swelling and pain. Taking po abx (doxycycline) for about a week w/o improvement.  No fevers, chills , sweats.  No CP, no SOB or cough.    Patient has long 15 yr + hx of psoriatic arthritis. Has been on at least 6 different biological agents over the past 15 years.  Has some degenerative arthritis of the hands.  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    Has had numerous procedures for his afib , including ablation at [facility] x 1 and around 10-12 cardioversions.  Takes tikosyn.     Chart review: Aug 04 - R periorbitial cellulitis May 09 - parox Afib for Tikosyn initiation. Obesity.  EF 40-50%, hx TEE DCCV in march.  Nov 14 - psoriatic arthritis, HTN, afib. Hx ablation at [facility] maintained nsr on flecainide but went back into afib. Had 2 more cardioversions to NSR in 2014 but neither lasted long. Admitted for tikosyn loading; didn't convert w loading so had DCCV then dc'd home.    Mar 15 - acute pericarditis, small-mod effusion. Rx'd w dilt drip, nsaids, and colchicine. Converted to NSR and changed to po dilt and po tikosyn. Steroids were added as CP didn't improve. Plan steroid taper at dc, as well as colchicine for next 2-3 mos.  On Eliquis for afib while in hosp.    ROS  no ab dpain, no n/v/d  no joint pain  no wounds or ulcers  no HA  no blurry vision  Where does patient live hom Can patient participate in ADLs? yes  Past Medical History  Past Medical History  Diagnosis Date  . Paroxysmal atrial fibrillation ([place])     a. s/p DCCV 09/2007, 11/2007, 04/2013 --> flecainide and chronic coumadin.  . Morbid obesity ([place])   . Hypertension   .  Psoriatic arthritis ([place])   . Cellulitis   . Atrial flutter ([place])     a. s/p DCCV 08/16/2012.  Marland Kitchen NICM (nonischemic cardiomyopathy) ([place])     a. EF 45-50% in setting of AF in past; b. Neg MV;  c. 04/2010 Echo: EF 55-60%;  d. 11/2011 Cath: nl cors.   Past Surgical History  Past Surgical History  Procedure Laterality Date  . Ablation    . Cardioversion    . Appendectomy    . Foot surgery    . Cardioversion  08/16/2012    Procedure: CARDIOVERSION;  Surgeon: Jolaine Artist, MD;  Location: [facility] ENDOSCOPY;  Service: Cardiovascular;  Laterality: N/A;  . Cardioversion N/A 06/05/2013    Procedure: CARDIOVERSION;  Surgeon: Jolaine Artist, MD;  Location: [facility] OR;  Service: Cardiovascular;  Laterality: N/A;  . Left heart catheterization with coronary angiogram N/A 12/22/2011    Procedure: LEFT HEART CATHETERIZATION WITH CORONARY ANGIOGRAM;  Surgeon: Jolaine Artist, MD;  Location: [facility] CATH LAB;  Service: Cardiovascular;  Laterality: N/A;  . Cardioversion N/A 07/11/2014    Procedure: CARDIOVERSION;  Surgeon: Jolaine Artist, MD;  Location: [facility] ENDOSCOPY;  Service: Cardiovascular;  Laterality: N/A;   Family History  Family History  Problem Relation Age of Onset  . Heart disease Neg Hx    Social History  reports that he  quit smoking about 25 years ago. His smoking use included Cigarettes. He does not have any smokeless tobacco history on file. He reports that he drinks about 1.8 - 2.4 oz of alcohol per week. He reports that he does not use illicit drugs. Allergies  Allergies  Allergen Reactions  . Lisinopril Cough   Home medications Prior to Admission medications   Medication Sig Start Date End Date Taking? Authorizing Provider  apixaban (ELIQUIS) 5 MG TABS tablet Take 1 tablet (5 mg total) by mouth 2 (two) times daily. 09/01/15   Shaune Pascal Bensimhon, MD  cephALEXin (KEFLEX) 750 MG capsule Take 750 mg by mouth 2 (two) times daily. 09/17/15   Historical Provider, MD  Certolizumab Pegol (CIMZIA  PREFILLED) 2 X 200 MG/ML KIT Inject 200 mg into the skin every 14 (fourteen) days. Last inject 01/09/15    Historical Provider, MD  cholecalciferol (VITAMIN D) 1000 UNITS tablet Take 1,000 Units by mouth daily.     Historical Provider, MD  Colchicine 0.6 MG CAPS Take 1 capsule by mouth 2 (two) times daily. 07/07/15   Jolaine Artist, MD  diclofenac (VOLTAREN) 75 MG EC tablet Take 75 mg by mouth 2 (two) times daily. 09/17/15   Historical Provider, MD  Diclofenac-Misoprostol (ARTHROTEC) 75-0.2 MG TBEC Take 75 mg by mouth 2 (two) times daily.    Historical Provider, MD  dofetilide (TIKOSYN) 500 MCG capsule Take 1 capsule (500 mcg total) by mouth every 12 (twelve) hours. 03/30/15   Jolaine Artist, MD  doxycycline (VIBRA-TABS) 100 MG tablet Take 100 mg by mouth every 12 (twelve) hours. 09/22/15   Historical Provider, MD  fish oil-omega-3 fatty acids 1000 MG capsule Take 1 g by mouth 2 (two) times daily.     Historical Provider, MD  folic acid (FOLVITE) 1 MG tablet Take 1 mg by mouth at bedtime.  12/05/14   Historical Provider, MD  furosemide (LASIX) 20 MG tablet Take 1 tablet (20 mg total) by mouth daily. 08/20/15   Jolaine Artist, MD  lovastatin (MEVACOR) 20 MG tablet Take 1 tablet (20 mg total) by mouth at bedtime. 03/31/15   Jolaine Artist, MD  Magnesium 400 MG CAPS Take 400 mg by mouth daily.    Historical Provider, MD  methotrexate 50 MG/2ML injection Inject 25 mg into the skin once a week. On Mondays 01/01/15   Historical Provider, MD  Multiple Vitamin (MULTIVITAMIN WITH MINERALS) TABS tablet Take 1 tablet by mouth daily.    Historical Provider, MD  niacin (NIASPAN) 500 MG CR tablet TAKE 1 TABLET BY MOUTH AT BEDTIME AS DIRECTED AND TAKE 1 ASPIRIN 1/2 HOUR PRIOR TO NIASPAN Patient taking differently: Take 500 mg by mouth at bedtime.  12/12/14   Jolaine Artist, MD  pantoprazole (PROTONIX) 40 MG tablet Take 1 tablet (40 mg total) by mouth 2 (two) times daily before a meal. 09/01/15   Jolaine Artist, MD  potassium chloride SA (K-DUR,KLOR-CON) 20 MEQ tablet Take 2 tablets (40 mEq total) by mouth 2 (two) times a week. On Monday and Thursday nights 03/05/15   Jolaine Artist, MD  Probiotic Product (PROBIOTIC PO) Take 1 tablet by mouth daily.    Historical Provider, MD  valsartan (DIOVAN) 80 MG tablet take 1 tablet by mouth once daily 04/29/15   Larey Dresser, MD   Liver Function Tests No results for input(s): AST, ALT, ALKPHOS, BILITOT, PROT, ALBUMIN in the last 168 hours. No results for input(s): LIPASE, AMYLASE in the last  168 hours. CBC  Recent Labs Lab 09/24/15 2107 09/24/15 2146  WBC 3.6*  --   NEUTROABS 1.6*  --   HGB 14.5 18.0*  HCT 42.8 53.0*  MCV 88.2  --   PLT 156  --    Basic Metabolic Panel  Recent Labs Lab 09/24/15 2146  NA 144  K 3.4*  CL 102  GLUCOSE 102*  BUN 18  CREATININE 0.80     Filed Vitals:   09/24/15 1651 09/24/15 1949 09/24/15 2100 09/24/15 2200  BP: 123/78 138/82 157/80 140/57  Pulse: 66 63 55 61  Temp: 98.3 F (36.8 C) 97.9 F (36.6 C)    TempSrc: Oral Oral    Resp: 18 16    SpO2: 96% 98% 99% 99%   Exam: Alert, obese, plesasant no distress No rash, cyanosis or gangrene Sclera anicteric, throat clear No jvd Chest clear bilat RRR no MRG ABd obese, soft ntnd no mass or ascites +BS GU deferred MS no joint effusions LLE bright pink erythema of left leg below the knee to above the ankle.  No abscess or drainage RLE mild 1+ edema Neuro is nf, ox 3   Assessment: 1 Cellulitis L leg - admit, IVF, IV abx 2 Psoriatic arthritis - takes biologicals 3 Afib - cont Eliquis/ Tikosyn. Is in NSR. Hx multiple cardioversions.  4 HTN - cont valsartan / lasix  Plan - as above   DVT Prophylaxis not needed  Code Status: full  Family Communication: wife at bedside  Disposition Plan: home when better    Sol Blazing [facility] Pager [phone]  Cell [phone]  If 7PM-7AM, please contact  night-coverage [email or web address] Password [facility] 09/24/2015, 10:32 PM

## 2015-09-25 ENCOUNTER — Encounter (HOSPITAL_COMMUNITY): Payer: Self-pay | Admitting: General Practice

## 2015-09-25 DIAGNOSIS — I4891 Unspecified atrial fibrillation: Secondary | ICD-10-CM | POA: Diagnosis present

## 2015-09-25 DIAGNOSIS — I481 Persistent atrial fibrillation: Secondary | ICD-10-CM

## 2015-09-25 LAB — URINALYSIS, ROUTINE W REFLEX MICROSCOPIC
BILIRUBIN URINE: NEGATIVE
Glucose, UA: NEGATIVE mg/dL
Hgb urine dipstick: NEGATIVE
Ketones, ur: NEGATIVE mg/dL
NITRITE: NEGATIVE
PH: 5.5 (ref 5.0–8.0)
Protein, ur: NEGATIVE mg/dL
SPECIFIC GRAVITY, URINE: 1.027 (ref 1.005–1.030)

## 2015-09-25 LAB — URINE MICROSCOPIC-ADD ON

## 2015-09-25 MED ORDER — COLCHICINE 0.6 MG PO TABS
0.6000 mg | ORAL_TABLET | Freq: Two times a day (BID) | ORAL | Status: DC
  Administered 2015-09-25 – 2015-09-27 (×5): 0.6 mg via ORAL
  Filled 2015-09-25 (×5): qty 1

## 2015-09-25 MED ORDER — APIXABAN 5 MG PO TABS
5.0000 mg | ORAL_TABLET | Freq: Two times a day (BID) | ORAL | Status: DC
  Administered 2015-09-25 – 2015-09-27 (×6): 5 mg via ORAL
  Filled 2015-09-25 (×6): qty 1

## 2015-09-25 MED ORDER — DOFETILIDE 500 MCG PO CAPS
500.0000 ug | ORAL_CAPSULE | Freq: Two times a day (BID) | ORAL | Status: DC
  Administered 2015-09-25 – 2015-09-27 (×6): 500 ug via ORAL
  Filled 2015-09-25 (×7): qty 1

## 2015-09-25 MED ORDER — OMEGA-3 FATTY ACIDS 1000 MG PO CAPS: 1.0000 g | ORAL_CAPSULE | Freq: Two times a day (BID) | ORAL | Status: DC

## 2015-09-25 MED ORDER — COLCHICINE 0.6 MG PO CAPS: 1.0000 | ORAL_CAPSULE | Freq: Two times a day (BID) | ORAL | Status: DC

## 2015-09-25 MED ORDER — ACETAMINOPHEN 650 MG RE SUPP: 650.0000 mg | Freq: Four times a day (QID) | RECTAL | Status: DC | PRN

## 2015-09-25 MED ORDER — SODIUM CHLORIDE 0.45 % IV SOLN
INTRAVENOUS | Status: DC
  Administered 2015-09-25: 02:00:00 via INTRAVENOUS

## 2015-09-25 MED ORDER — DICLOFENAC SODIUM 75 MG PO TBEC
75.0000 mg | DELAYED_RELEASE_TABLET | Freq: Two times a day (BID) | ORAL | Status: DC
  Administered 2015-09-25 – 2015-09-27 (×6): 75 mg via ORAL
  Filled 2015-09-25 (×7): qty 1

## 2015-09-25 MED ORDER — DICLOFENAC-MISOPROSTOL 75-0.2 MG PO TBEC: 75.0000 mg | DELAYED_RELEASE_TABLET | Freq: Two times a day (BID) | ORAL | Status: DC

## 2015-09-25 MED ORDER — POLYETHYLENE GLYCOL 3350 17 G PO PACK
17.0000 g | PACK | Freq: Two times a day (BID) | ORAL | Status: DC
  Administered 2015-09-25 – 2015-09-27 (×5): 17 g via ORAL
  Filled 2015-09-25 (×5): qty 1

## 2015-09-25 MED ORDER — POTASSIUM CHLORIDE CRYS ER 20 MEQ PO TBCR: 40.0000 meq | EXTENDED_RELEASE_TABLET | ORAL | Status: DC

## 2015-09-25 MED ORDER — FUROSEMIDE 20 MG PO TABS
20.0000 mg | ORAL_TABLET | Freq: Every day | ORAL | Status: DC
  Administered 2015-09-25 – 2015-09-27 (×3): 20 mg via ORAL
  Filled 2015-09-25 (×3): qty 1

## 2015-09-25 MED ORDER — ALUM & MAG HYDROXIDE-SIMETH 200-200-20 MG/5ML PO SUSP: 30.0000 mL | Freq: Four times a day (QID) | ORAL | Status: DC | PRN

## 2015-09-25 MED ORDER — VITAMIN D 1000 UNITS PO TABS
1000.0000 [IU] | ORAL_TABLET | Freq: Every day | ORAL | Status: DC
  Administered 2015-09-25 – 2015-09-27 (×3): 1000 [IU] via ORAL
  Filled 2015-09-25 (×3): qty 1

## 2015-09-25 MED ORDER — ACETAMINOPHEN 325 MG PO TABS
650.0000 mg | ORAL_TABLET | Freq: Four times a day (QID) | ORAL | Status: DC | PRN
Start: 2015-09-25 — End: 2015-09-27

## 2015-09-25 MED ORDER — VANCOMYCIN HCL 10 G IV SOLR
2500.0000 mg | Freq: Once | INTRAVENOUS | Status: AC
  Administered 2015-09-25: 2500 mg via INTRAVENOUS
  Filled 2015-09-25: qty 2500

## 2015-09-25 MED ORDER — INFLUENZA VAC SPLIT QUAD 0.5 ML IM SUSY
0.5000 mL | PREFILLED_SYRINGE | INTRAMUSCULAR | Status: AC
  Administered 2015-09-26: 0.5 mL via INTRAMUSCULAR
  Filled 2015-09-25: qty 0.5

## 2015-09-25 MED ORDER — PRAVASTATIN SODIUM 20 MG PO TABS
10.0000 mg | ORAL_TABLET | Freq: Every day | ORAL | Status: DC
  Administered 2015-09-25 – 2015-09-26 (×2): 10 mg via ORAL
  Filled 2015-09-25 (×3): qty 1

## 2015-09-25 MED ORDER — POTASSIUM CHLORIDE CRYS ER 20 MEQ PO TBCR
40.0000 meq | EXTENDED_RELEASE_TABLET | ORAL | Status: DC
  Administered 2015-09-25: 40 meq via ORAL
  Filled 2015-09-25: qty 2

## 2015-09-25 MED ORDER — NIACIN ER (ANTIHYPERLIPIDEMIC) 500 MG PO TBCR
500.0000 mg | EXTENDED_RELEASE_TABLET | Freq: Every day | ORAL | Status: DC
  Administered 2015-09-25 – 2015-09-26 (×3): 500 mg via ORAL
  Filled 2015-09-25 (×4): qty 1

## 2015-09-25 MED ORDER — SODIUM CHLORIDE 0.9 % IV SOLN
INTRAVENOUS | Status: DC
  Administered 2015-09-25 – 2015-09-26 (×2): via INTRAVENOUS

## 2015-09-25 MED ORDER — ONDANSETRON HCL 4 MG PO TABS: 4.0000 mg | ORAL_TABLET | Freq: Four times a day (QID) | ORAL | Status: DC | PRN

## 2015-09-25 MED ORDER — OMEGA-3-ACID ETHYL ESTERS 1 G PO CAPS
1.0000 g | ORAL_CAPSULE | Freq: Two times a day (BID) | ORAL | Status: DC
  Administered 2015-09-25 – 2015-09-27 (×6): 1 g via ORAL
  Filled 2015-09-25 (×6): qty 1

## 2015-09-25 MED ORDER — VANCOMYCIN HCL 10 G IV SOLR
1500.0000 mg | Freq: Two times a day (BID) | INTRAVENOUS | Status: DC
  Administered 2015-09-26: 1500 mg via INTRAVENOUS
  Filled 2015-09-25 (×2): qty 1500

## 2015-09-25 MED ORDER — MAGNESIUM OXIDE 400 (241.3 MG) MG PO TABS
400.0000 mg | ORAL_TABLET | Freq: Every day | ORAL | Status: DC
  Administered 2015-09-25 – 2015-09-27 (×3): 400 mg via ORAL
  Filled 2015-09-25 (×3): qty 1

## 2015-09-25 MED ORDER — ONDANSETRON HCL 4 MG/2ML IJ SOLN: 4.0000 mg | Freq: Four times a day (QID) | INTRAMUSCULAR | Status: DC | PRN

## 2015-09-25 MED ORDER — ADULT MULTIVITAMIN W/MINERALS CH
1.0000 | ORAL_TABLET | Freq: Every day | ORAL | Status: DC
  Administered 2015-09-25 – 2015-09-27 (×3): 1 via ORAL
  Filled 2015-09-25 (×3): qty 1

## 2015-09-25 MED ORDER — PANTOPRAZOLE SODIUM 40 MG PO TBEC
40.0000 mg | DELAYED_RELEASE_TABLET | Freq: Two times a day (BID) | ORAL | Status: DC
  Administered 2015-09-25 – 2015-09-27 (×4): 40 mg via ORAL
  Filled 2015-09-25 (×6): qty 1

## 2015-09-25 MED ORDER — POTASSIUM CHLORIDE CRYS ER 20 MEQ PO TBCR
40.0000 meq | EXTENDED_RELEASE_TABLET | Freq: Two times a day (BID) | ORAL | Status: DC
  Administered 2015-09-25 – 2015-09-27 (×4): 40 meq via ORAL
  Filled 2015-09-25 (×4): qty 2

## 2015-09-25 MED ORDER — IRBESARTAN 75 MG PO TABS
37.5000 mg | ORAL_TABLET | Freq: Every day | ORAL | Status: DC
  Administered 2015-09-25 – 2015-09-27 (×3): 37.5 mg via ORAL
  Filled 2015-09-25 (×3): qty 0.5

## 2015-09-25 NOTE — Progress Notes (Signed)
Patient arrived to unit alert and oriented x4. Cellulitis marked on left leg. No signs of acute distress. Oriented to unit and plan of care.

## 2015-09-25 NOTE — Progress Notes (Signed)
Report received from [facility] for admission to [phone]

## 2015-09-25 NOTE — ED Notes (Signed)
Breakfast Tray ordered @ 0555. 

## 2015-09-25 NOTE — Progress Notes (Signed)
Pharmacy Antibiotic Note  Mark Gonzalez. is a 55 y.o. male admitted on 09/24/2015 with redness and LLE swelling concerning for cellulitis.  Pharmacy has been consulted for Vancomycin dosing.  The patient was noted to be taking Doxycycline PTA for "about a week without improvement". The patient has a history of psoriatic arthritis and use of biological agents - currently on Cimzia every 2 weeks. Wt: 183.3 kg, SCr 0.8, CrCl~85 ml/min (normalized)  The patient did receive a dose of Vancomycin 1g on 2/23 @ 2054. Given high weight - and ~18 hours since the last dose, will give full load this afternoon.   Plan: 1. Vancomycin 2500 mg IV x 1 dose to load 2. Start Vancomycin 1500 mg IV every 12 hours 3. Will continue to follow renal function, culture results, LOT, and antibiotic de-escalation plans      Temp (24hrs), Avg:98.1 F (36.7 C), Min:97.9 F (36.6 C), Max:98.3 F (36.8 C)   Recent Labs Lab 09/24/15 2107 09/24/15 2146  WBC 3.6*  --   CREATININE  --  0.80    CrCl cannot be calculated (Unknown ideal weight.).    Allergies  Allergen Reactions  . Lisinopril Cough    Antimicrobials this admission: Ancef 2/24 >> Vanc 2/24 >>  Dose adjustments this admission: n/a  Microbiology results: n/a  Thank you for allowing pharmacy to be a part of this patient's care.  Georgina Pillion, PharmD, BCPS Clinical Pharmacist Pager: [phone] 09/25/2015 3:38 PM

## 2015-09-25 NOTE — Progress Notes (Addendum)
[facility] PROGRESS NOTE  Mark Gonzalez. TDD:[id number] DOB: May 18, 1961 DOA: 09/24/2015 PCP: Arvilla Meres, MD  Length of stay: 1   Assessment/Plan: Principal Problem:   Cellulitis of leg Active Problems:   Morbid obesity (HCC)   Essential hypertension, benign   Atrial fibrillation [facility])   Brief summary  Mark Lary. is a 55 y.o. male with hx of obesity, afib, HTN and psoriatic arthritis. He presents with LLE erythema, swelling and pain. Taking po abx (doxycycline) for about a week w/o improvement. No fevers, chills , sweats. No CP, no SOB or cough.  Patient has long 15 yr + hx of psoriatic arthritis. Has been on at least 6 different biological agents over the past 15 years. Has some degenerative arthritis of the hands. 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  Has had numerous procedures for his afib , including ablation at [facility] x 1 and around 10-12 cardioversions. Takes tikosyn.    Assessment and plan 1 Cellulitis L leg -patient failed outpatient Keflex 1 week as well as doxycycline, patient now admitted for IV antibiotics, continue IV cefazolin, afebrile, no white count but the patient is also immunocompromised, obtain blood culture 2 for fever greater than 100.0. Given the fact that patient is immunocompromise will order CT scan to rule out abscess if he spike fevers or his redness progresses . 2 Psoriatic arthritis - takes biologicals, patient takes cimzia at home 3 Afib - cont Eliquis/ Tikosyn. Is in NSR. Hx multiple cardioversions. Maintain patient on telemetry 4 HTN - cont valsartan / lasix, follow renal function closely, blood pressure is controlled at this time      DVT prophylaxsis eliquis   Code Status:      Code Status Orders        Start     Ordered   09/25/15 0045  Full code   Continuous     09/25/15 0044    Code Status History    Date Active Date Inactive Code Status Order ID Comments User Context    10/11/2013  7:01 PM 10/16/2013  5:08 PM Full Code [id number]  Pricilla Riffle, MD Inpatient   10/01/2013  3:40 PM 10/04/2013  8:42 PM Full Code [id number]  Sherald Hess, NP Inpatient   06/03/2013 11:18 AM 06/05/2013 11:16 PM Full Code [id number]  Sherald Hess, NP Inpatient      Family Communication: Discussed in detail with the patient, all imaging results, lab results explained to the patient   Disposition Plan: Admit patient to telemetry     Consultants:  None  Procedures:  None  Antibiotics: Anti-infectives    Start     Dose/Rate Route Frequency Ordered Stop   09/25/15 0000  ceFAZolin (ANCEF) IVPB 2 g/50 mL premix     2 g 100 mL/hr over 30 Minutes Intravenous 3 times per day 09/24/15 2356     09/24/15 2000  vancomycin (VANCOCIN) IVPB 1000 mg/200 mL premix     1,000 mg 200 mL/hr over 60 Minutes Intravenous  Once 09/24/15 1953 09/24/15 2155         HPI/Subjective: Complaining of pain and redness of the left leg,  Objective: Filed Vitals:   09/25/15 0900 09/25/15 1000 09/25/15 1100 09/25/15 1200  BP: 127/77 138/79 142/89 129/80  Pulse: 59 54 61 63  Temp:      TempSrc:      Resp:      SpO2: 98% 100% 94% 100%   No  intake or output data in the 24 hours ending 09/25/15 1353  Exam:  General: No acute respiratory distress Lungs: Clear to auscultation bilaterally without wheezes or crackles Cardiovascular: Regular rate and rhythm without murmur gallop or rub normal S1 and S2 Abdomen: Nontender, nondistended, soft, bowel sounds positive, no rebound, no ascites, no appreciable mass LLE bright pink erythema of left leg below the knee to above the ankle. No abscess or drainage     Data Review   Micro Results No results found for this or any previous visit (from the past 240 hour(s)).  Radiology Reports No results found.   CBC  Recent Labs Lab 09/24/15 2107 09/24/15 2146  WBC 3.6*  --   HGB 14.5 18.0*  HCT 42.8 53.0*  PLT 156  --   MCV 88.2  --   MCH 29.9  --    MCHC 33.9  --   RDW 13.4  --   LYMPHSABS 1.6  --   MONOABS 0.3  --   EOSABS 0.1  --   BASOSABS 0.0  --     Chemistries   Recent Labs Lab 09/24/15 2146 09/24/15 2324  NA 144  --   K 3.4*  --   CL 102  --   GLUCOSE 102*  --   BUN 18  --   CREATININE 0.80  --   MG  --  2.0   ------------------------------------------------------------------------------------------------------------------ CrCl cannot be calculated (Unknown ideal weight.). ------------------------------------------------------------------------------------------------------------------ No results for input(s): HGBA1C in the last 72 hours. ------------------------------------------------------------------------------------------------------------------ No results for input(s): CHOL, HDL, LDLCALC, TRIG, CHOLHDL, LDLDIRECT in the last 72 hours. ------------------------------------------------------------------------------------------------------------------ No results for input(s): TSH, T4TOTAL, T3FREE, THYROIDAB in the last 72 hours.  Invalid input(s): FREET3 ------------------------------------------------------------------------------------------------------------------ No results for input(s): VITAMINB12, FOLATE, FERRITIN, TIBC, IRON, RETICCTPCT in the last 72 hours.  Coagulation profile No results for input(s): INR, PROTIME in the last 168 hours.  No results for input(s): DDIMER in the last 72 hours.  Cardiac Enzymes No results for input(s): CKMB, TROPONINI, MYOGLOBIN in the last 168 hours.  Invalid input(s): CK ------------------------------------------------------------------------------------------------------------------ Invalid input(s): POCBNP   CBG: No results for input(s): GLUCAP in the last 168 hours.     Studies: No results found.    No results found for: HGBA1C Lab Results  Component Value Date   LDLCALC 92 04/22/2010   CREATININE 0.80 09/24/2015       Scheduled Meds: .  apixaban  5 mg Oral BID  . cholecalciferol  1,000 Units Oral Daily  . colchicine  0.6 mg Oral BID  . diclofenac  75 mg Oral BID  . dofetilide  500 mcg Oral Q12H  . furosemide  20 mg Oral Daily  . irbesartan  37.5 mg Oral Daily  . magnesium oxide  400 mg Oral Daily  . multivitamin with minerals  1 tablet Oral Daily  . niacin  500 mg Oral QHS  . omega-3 acid ethyl esters  1 g Oral BID  . pantoprazole  40 mg Oral BID AC  . potassium chloride SA  40 mEq Oral BID  . pravastatin  10 mg Oral q1800   Continuous Infusions: . sodium chloride 50 mL/hr at 09/25/15 0149  .  ceFAZolin (ANCEF) IV Stopped (09/25/15 1191)    Principal Problem:   Cellulitis of leg Active Problems:   Morbid obesity (HCC)   Essential hypertension, benign   Atrial fibrillation (HCC)    Time spent: 45 minutes   [facility]  [facility] Pager [phone]. If 7PM-7AM, please contact night-coverage  at [email or web address], password [facility] 09/25/2015, 1:53 PM  LOS: 1 day

## 2015-09-26 LAB — CBC
HEMATOCRIT: 40.6 % (ref 39.0–52.0)
HEMOGLOBIN: 13.6 g/dL (ref 13.0–17.0)
MCH: 30 pg (ref 26.0–34.0)
MCHC: 33.5 g/dL (ref 30.0–36.0)
MCV: 89.4 fL (ref 78.0–100.0)
Platelets: 133 10*3/uL — ABNORMAL LOW (ref 150–400)
RBC: 4.54 MIL/uL (ref 4.22–5.81)
RDW: 13.6 % (ref 11.5–15.5)
WBC: 3.3 10*3/uL — ABNORMAL LOW (ref 4.0–10.5)

## 2015-09-26 LAB — COMPREHENSIVE METABOLIC PANEL
ALBUMIN: 3.3 g/dL — AB (ref 3.5–5.0)
ALK PHOS: 57 U/L (ref 38–126)
ALT: 38 U/L (ref 17–63)
ANION GAP: 10 (ref 5–15)
AST: 25 U/L (ref 15–41)
BILIRUBIN TOTAL: 0.5 mg/dL (ref 0.3–1.2)
BUN: 19 mg/dL (ref 6–20)
CALCIUM: 8.7 mg/dL — AB (ref 8.9–10.3)
CO2: 24 mmol/L (ref 22–32)
Chloride: 105 mmol/L (ref 101–111)
Creatinine, Ser: 0.76 mg/dL (ref 0.61–1.24)
GLUCOSE: 99 mg/dL (ref 65–99)
POTASSIUM: 4.3 mmol/L (ref 3.5–5.1)
Sodium: 139 mmol/L (ref 135–145)
TOTAL PROTEIN: 6.1 g/dL — AB (ref 6.5–8.1)

## 2015-09-26 MED ORDER — CLINDAMYCIN PHOSPHATE 600 MG/50ML IV SOLN
600.0000 mg | Freq: Three times a day (TID) | INTRAVENOUS | Status: DC
  Administered 2015-09-26 – 2015-09-27 (×4): 600 mg via INTRAVENOUS
  Filled 2015-09-26 (×4): qty 50

## 2015-09-26 MED ORDER — SACCHAROMYCES BOULARDII 250 MG PO CAPS
250.0000 mg | ORAL_CAPSULE | Freq: Two times a day (BID) | ORAL | Status: DC
  Administered 2015-09-26 – 2015-09-27 (×3): 250 mg via ORAL
  Filled 2015-09-26 (×3): qty 1

## 2015-09-26 NOTE — Progress Notes (Signed)
[facility] Progress Note  Patient: Mark Gonzalez. OZD:[id number]   PCP: No PCP Per Patient DOB: 1960/12/10   DOA: 09/24/2015   DOS: 09/26/2015   Date of Service: the patient was seen and examined on 09/26/2015  Subjective: Patient continues to have some pain, redness is improving. Swelling is stabilizing. No nausea no vomiting or diarrhea, patient only has taken 4 doses of doxycycline Nutrition: Tolerating oral diet Activity: Walking in the room Last BM: 09/25/2015  Assessment and Plan: 1. Cellulitis of leg presents with worsening swelling as well as redness on the left leg. Patient has taken 2 days of Keflex 2 days of doxycycline prior to arrival without any improvement. Currently it appears that the vancomycin is working with the redness. I will transition the patient to clindamycin to facilitate the discharge process. Monitor the patient in the hospital.  2. Psoriatic arthritis. Patient takes a lot of immunosuppressive medications. ESR and CRP will be of no value given patient's chronic inflammatory condition. We will continue monitoring.  3. Atrial fibrillation. Continue Tikosyn. Continue Apixaban.  DVT Prophylaxis: on therapeutic anticoagulation. Nutrition: Cardiac diet  Advance goals of care discussion: ful code  Brief Summary of Hospitalization:  Procedures: none Consultants: none Antibiotics: Anti-infectives    Start     Dose/Rate Route Frequency Ordered Stop   09/26/15 1400  clindamycin (CLEOCIN) IVPB 600 mg     600 mg 100 mL/hr over 30 Minutes Intravenous 3 times per day 09/26/15 1043     09/26/15 0600  vancomycin (VANCOCIN) 1,500 mg in sodium chloride 0.9 % 500 mL IVPB  Status:  Discontinued     1,500 mg 250 mL/hr over 120 Minutes Intravenous Every 12 hours 09/25/15 1537 09/26/15 1040   09/25/15 1600  vancomycin (VANCOCIN) 2,500 mg in sodium chloride 0.9 % 500 mL IVPB     2,500 mg 250 mL/hr over 120 Minutes Intravenous  Once 09/25/15 1537 09/25/15 1755    09/25/15 0000  ceFAZolin (ANCEF) IVPB 2 g/50 mL premix  Status:  Discontinued     2 g 100 mL/hr over 30 Minutes Intravenous 3 times per day 09/24/15 2356 09/26/15 1040   09/24/15 2000  vancomycin (VANCOCIN) IVPB 1000 mg/200 mL premix     1,000 mg 200 mL/hr over 60 Minutes Intravenous  Once 09/24/15 1953 09/24/15 2155       Family Communication: no family was present at bedside, at the time of interview.   Disposition:  Expected discharge date: 09/27/2015 Barriers to safe discharge: response to clindamycin   Intake/Output Summary (Last 24 hours) at 09/26/15 1505 Last data filed at 09/26/15 1414  Gross per 24 hour  Intake 3268.75 ml  Output   1750 ml  Net 1518.75 ml   Filed Weights   09/25/15 1522  Weight: 183.344 kg (404 lb 3.2 oz)    Objective: Physical Exam: Filed Vitals:   09/25/15 1500 09/25/15 1522 09/25/15 2258 09/26/15 0604  BP: 129/89  129/50 119/82  Pulse: 64  50 46  Temp:   98.2 F (36.8 C)   TempSrc:   Oral   Resp: _0 Height:  6' (1.829 m)    Weight:  183.344 kg (404 lb 3.2 oz)    SpO2: 98%  100% 99%     General: Appear in mild distress, left foot redness,  Oral Mucosa moist. Cardiovascular: S1 and S2 Present, no Murmur, no JVD Respiratory: Bilateral Air entry present and Clear to Auscultation, no Crackles, no wheezes Abdomen: Bowel Sound present,  Soft and no tenderness Extremities: no Pedal edema, no calf tenderness Neurology: Grossly no focal neuro deficit.  Data Reviewed: CBC:  Recent Labs Lab 09/24/15 2107 09/24/15 2146 09/26/15 0656  WBC 3.6*  --  3.3*  NEUTROABS 1.6*  --   --   HGB 14.5 18.0* 13.6  HCT 42.8 53.0* 40.6  MCV 88.2  --  89.4  PLT 156  --  014*   Basic Metabolic Panel:  Recent Labs Lab 09/24/15 2146 09/24/15 2324 09/26/15 0656  NA 144  --  139  K 3.4*  --  4.3  CL 102  --  105  CO2  --   --  24  GLUCOSE 102*  --  99  BUN 18  --  19  CREATININE 0.80  --  0.76  CALCIUM  --   --  8.7*  MG  --  2.0  --      Liver Function Tests:  Recent Labs Lab 09/26/15 0656  AST 25  ALT 38  ALKPHOS 57  BILITOT 0.5  PROT 6.1*  ALBUMIN 3.3*   No results for input(s): LIPASE, AMYLASE in the last 168 hours. No results for input(s): AMMONIA in the last 168 hours.  Cardiac Enzymes: No results for input(s): CKTOTAL, CKMB, CKMBINDEX, TROPONINI in the last 168 hours.  BNP (last 3 results) No results for input(s): BNP in the last 8760 hours.  CBG: No results for input(s): GLUCAP in the last 168 hours.  No results found for this or any previous visit (from the past 240 hour(s)).   Studies: No results found.   Scheduled Meds: . apixaban  5 mg Oral BID  . cholecalciferol  1,000 Units Oral Daily  . clindamycin (CLEOCIN) IV  600 mg Intravenous 3 times per day  . colchicine  0.6 mg Oral BID  . diclofenac  75 mg Oral BID  . dofetilide  500 mcg Oral Q12H  . furosemide  20 mg Oral Daily  . irbesartan  37.5 mg Oral Daily  . magnesium oxide  400 mg Oral Daily  . multivitamin with minerals  1 tablet Oral Daily  . niacin  500 mg Oral QHS  . omega-3 acid ethyl esters  1 g Oral BID  . pantoprazole  40 mg Oral BID AC  . polyethylene glycol  17 g Oral BID  . potassium chloride SA  40 mEq Oral BID  . pravastatin  10 mg Oral q1800  . saccharomyces boulardii  250 mg Oral BID   Continuous Infusions:  PRN Meds: acetaminophen **OR** acetaminophen, alum & mag hydroxide-simeth, HYDROcodone-acetaminophen, ondansetron **OR** ondansetron (ZOFRAN) IV  Time spent: 30 minutes  Author: Berle Mull, MD [facility] Pager: [phone] 09/26/2015 3:05 PM  If 7PM-7AM, please contact night-coverage at [email or web address], password [facility]

## 2015-09-27 LAB — CBC
HCT: 41.9 % (ref 39.0–52.0)
HEMOGLOBIN: 14.1 g/dL (ref 13.0–17.0)
MCH: 29.9 pg (ref 26.0–34.0)
MCHC: 33.7 g/dL (ref 30.0–36.0)
MCV: 88.8 fL (ref 78.0–100.0)
Platelets: 136 10*3/uL — ABNORMAL LOW (ref 150–400)
RBC: 4.72 MIL/uL (ref 4.22–5.81)
RDW: 13.5 % (ref 11.5–15.5)
WBC: 3.1 10*3/uL — ABNORMAL LOW (ref 4.0–10.5)

## 2015-09-27 LAB — C-REACTIVE PROTEIN: CRP: 0.6 mg/dL (ref <1.0)

## 2015-09-27 LAB — SEDIMENTATION RATE: SED RATE: 19 mm/h — AB (ref 0–16)

## 2015-09-27 MED ORDER — POLYETHYLENE GLYCOL 3350 17 G PO PACK: 17.0000 g | PACK | Freq: Two times a day (BID) | ORAL | Status: DC

## 2015-09-27 MED ORDER — POLYETHYLENE GLYCOL 3350 17 G PO PACK: 17.0000 g | PACK | Freq: Every day | ORAL | Status: DC | PRN

## 2015-09-27 MED ORDER — SACCHAROMYCES BOULARDII 250 MG PO CAPS: 250.0000 mg | ORAL_CAPSULE | Freq: Two times a day (BID) | ORAL | Status: DC

## 2015-09-27 MED ORDER — CLINDAMYCIN HCL 300 MG PO CAPS: 600.0000 mg | ORAL_CAPSULE | Freq: Three times a day (TID) | ORAL | Status: AC

## 2015-09-27 NOTE — Progress Notes (Signed)
Utilization review completed.  

## 2015-09-27 NOTE — Discharge Summary (Signed)
[facility] Discharge Summary   Patient: Mark Gonzalez. WEX:[id number]   PCP: No PCP Per Patient DOB: [birth year]   Date of admission: 09/24/2015   Date of discharge: 09/27/2015    Discharge Diagnoses:  Principal Problem:   Cellulitis of leg Active Problems:   Morbid obesity ([place])   Essential hypertension, benign   Atrial fibrillation (HCC)   AF (atrial fibrillation) ([place])   Recommendations for Outpatient Follow-up:  1. Follow up with PCP in 1 week regarding cellulitis   Follow-up Information    Call in 1 week to follow up.   Why:  primary MD for follow up.      Diet recommendation: cardiac diet  Activity: The patient is advised to gradually reintroduce usual activities.  Discharge Condition: good  History of present illness: As per the H and P dictated on admission, "Cedrik W Sprung Jr. is a 55 y.o. male with hx of obesity, afib, HTN and psoriatic arthritis. He presents with LLE erythema, swelling and pain. Taking po abx (doxycycline) for about a week w/o improvement. No fevers, chills , sweats. No CP, no SOB or cough.   Patient has long 15 yr + hx of psoriatic arthritis. Has been on at least 6 different biological agents over the past 15 years. Has some degenerative arthritis of the hands. 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   Has had numerous procedures for his afib , including ablation at [facility] x 1 and around 10-12 cardioversions. Takes [facility] Course:  Summary of his active problems in the hospital is as following. 1. Cellulitis of leg presents with worsening swelling as well as redness on the left leg. Patient has taken 2 days of Keflex 2 days of doxycycline prior to arrival without any improvement. Redness continues to improve with clindamycin with occasional intermittent leg pain and redness. Normal ESR adn CRP, no fluctuation noted on exam to suggest abscess. Will finish clindamycin course for 10 days and provide  probiotics.  2. Psoriatic arthritis. Patient takes a lot of immunosuppressive medications. ESR and CRP will be of no value given patient's chronic inflammatory condition.  3. Atrial fibrillation. Continue Tikosyn. Continue Apixaban.  4. Chronic diastolic dysfunction Resuming home medication on discharge.   All other chronic medical condition were stable during the hospitalization.  Patient was ambulatory without any assistance. On the day of the discharge the patient's cellulitis improving, and no other acute medical condition were reported by patient. the patient was felt safe to be discharge at home with family.  Procedures and Results:  none   Consultations:  none  DISCHARGE MEDICATION: Discharge Medication List as of 09/27/2015  3:38 PM    START taking these medications   Details  clindamycin (CLEOCIN) 300 MG capsule Take 2 capsules (600 mg total) by mouth 3 (three) times daily., Starting 09/27/2015, Until Sun 10/04/15, Normal    !! saccharomyces boulardii (FLORASTOR) 250 MG capsule Take 1 capsule (250 mg total) by mouth 2 (two) times daily., Starting 09/27/2015, Until Discontinued, Normal     !! - Potential duplicate medications found. Please discuss with provider.    CONTINUE these medications which have CHANGED   Details  polyethylene glycol (MIRALAX / GLYCOLAX) packet Take 17 g by mouth daily as needed for moderate constipation., Starting 09/27/2015, Until Discontinued, Normal      CONTINUE these medications which have NOT CHANGED   Details  apixaban (ELIQUIS) 5 MG TABS tablet Take 1 tablet (5 mg total) by mouth 2 (two) times  daily., Starting 09/01/2015, Until Discontinued, Normal    cholecalciferol (VITAMIN D) 1000 UNITS tablet Take 1,000 Units by mouth daily. , Until Discontinued, Historical Med    Colchicine 0.6 MG CAPS Take 1 capsule by mouth 2 (two) times daily., Starting 07/07/2015, Until Discontinued, Normal    diclofenac (VOLTAREN) 75 MG EC tablet Take 75 mg  by mouth 2 (two) times daily., Starting 09/17/2015, Until Discontinued, Historical Med    dofetilide (TIKOSYN) 500 MCG capsule Take 1 capsule (500 mcg total) by mouth every 12 (twelve) hours., Starting 03/30/2015, Until Discontinued, Normal    fish oil-omega-3 fatty acids 1000 MG capsule Take 1 g by mouth 2 (two) times daily. , Until Discontinued, Historical Med    furosemide (LASIX) 20 MG tablet Take 1 tablet (20 mg total) by mouth daily., Starting 08/20/2015, Until Discontinued, Normal    lovastatin (MEVACOR) 20 MG tablet Take 1 tablet (20 mg total) by mouth at bedtime., Starting 03/31/2015, Until Discontinued, Normal    Magnesium 400 MG CAPS Take 400 mg by mouth daily., Until Discontinued, Historical Med    Multiple Vitamin (MULTIVITAMIN WITH MINERALS) TABS tablet Take 1 tablet by mouth daily., Until Discontinued, Historical Med    niacin (NIASPAN) 500 MG CR tablet TAKE 1 TABLET BY MOUTH AT BEDTIME AS DIRECTED AND TAKE 1 ASPIRIN 1/2 HOUR PRIOR TO NIASPAN, Normal    pantoprazole (PROTONIX) 40 MG tablet Take 1 tablet (40 mg total) by mouth 2 (two) times daily before a meal., Starting 09/01/2015, Until Discontinued, Normal    potassium chloride SA (K-DUR,KLOR-CON) 20 MEQ tablet Take 2 tablets (40 mEq total) by mouth 2 (two) times a week. On Monday and Thursday nights, Starting 03/05/2015, Until Discontinued, Normal    !! Probiotic Product (PROBIOTIC PO) Take 1 tablet by mouth daily., Until Discontinued, Historical Med    valsartan (DIOVAN) 80 MG tablet take 1 tablet by mouth once daily, Normal    Certolizumab Pegol (CIMZIA PREFILLED) 2 X 200 MG/ML KIT Inject 200 mg into the skin every 14 (fourteen) days. Last inject 01/09/15, Until Discontinued, Historical Med    Diclofenac-Misoprostol (ARTHROTEC) 75-0.2 MG TBEC Take 75 mg by mouth 2 (two) times daily., Until Discontinued, Historical Med     !! - Potential duplicate medications found. Please discuss with provider.    STOP taking these  medications     doxycycline (VIBRA-TABS) 100 MG tablet        Allergies  Allergen Reactions  . Lisinopril Cough   Discharge Instructions    Diet - low sodium heart healthy    Complete by:  As directed      Increase activity slowly    Complete by:  As directed           Discharge Exam: Filed Weights   09/25/15 1522  Weight: 183.344 kg (404 lb 3.2 oz)   Filed Vitals:   09/27/15 0541 09/27/15 1347  BP: 122/61 129/67  Pulse: 51 57  Temp: 97.5 F (36.4 C) 97.9 F (36.6 C)  Resp: 18 18   General: Appear in no distress, left leg redness improving; Oral Mucosa moist. Cardiovascular: S1 and S2 Present, no Murmur, no JVD Respiratory: Bilateral Air entry present and Clear to Auscultation, no Crackles, no wheezes Abdomen: Bowel Sound prenosent, Soft and no tenderness Extremities: trace Pedal edema, no calf tenderness Neurology: Grossly no focal neuro deficit.  The results of significant diagnostics from this hospitalization (including imaging, microbiology, ancillary and laboratory) are listed below for reference.    Significant Diagnostic Studies:  No results found.  Microbiology: No results found for this or any previous visit (from the past 240 hour(s)).   Labs: CBC:  Recent Labs Lab 09/24/15 2107 09/24/15 2146 09/26/15 0656 09/27/15 0717  WBC 3.6*  --  3.3* 3.1*  NEUTROABS 1.6*  --   --   --   HGB 14.5 18.0* 13.6 14.1  HCT 42.8 53.0* 40.6 41.9  MCV 88.2  --  89.4 88.8  PLT 156  --  133* 828*   Basic Metabolic Panel:  Recent Labs Lab 09/24/15 2146 09/24/15 2324 09/26/15 0656  NA 144  --  139  K 3.4*  --  4.3  CL 102  --  105  CO2  --   --  24  GLUCOSE 102*  --  99  BUN 18  --  19  CREATININE 0.80  --  0.76  CALCIUM  --   --  8.7*  MG  --  2.0  --    Liver Function Tests:  Recent Labs Lab 09/26/15 0656  AST 25  ALT 38  ALKPHOS 57  BILITOT 0.5  PROT 6.1*  ALBUMIN 3.3*   No results for input(s): LIPASE, AMYLASE in the last 168 hours. No  results for input(s): AMMONIA in the last 168 hours. Cardiac Enzymes: No results for input(s): CKTOTAL, CKMB, CKMBINDEX, TROPONINI in the last 168 hours. BNP (last 3 results) No results for input(s): BNP in the last 8760 hours. CBG: No results for input(s): GLUCAP in the last 168 hours. Time spent: 30 minutes  Signed:  Berle Mull  [facility] 09/27/2015, 10:49 PM

## 2015-11-09 ENCOUNTER — Other Ambulatory Visit (HOSPITAL_COMMUNITY): Payer: Self-pay | Admitting: *Deleted

## 2015-11-09 MED ORDER — DOFETILIDE 500 MCG PO CAPS: 500.0000 ug | ORAL_CAPSULE | Freq: Two times a day (BID) | ORAL | Status: DC

## 2015-11-24 ENCOUNTER — Other Ambulatory Visit: Payer: Self-pay | Admitting: Cardiology

## 2015-11-25 ENCOUNTER — Other Ambulatory Visit (HOSPITAL_COMMUNITY): Payer: Self-pay | Admitting: *Deleted

## 2015-11-25 MED ORDER — LOVASTATIN 20 MG PO TABS: 20.0000 mg | ORAL_TABLET | Freq: Every day | ORAL | Status: DC

## 2015-12-01 ENCOUNTER — Telehealth (HOSPITAL_COMMUNITY): Payer: Self-pay | Admitting: Pharmacist

## 2015-12-01 MED ORDER — COLCHICINE 0.6 MG PO CAPS: 0.6000 mg | ORAL_CAPSULE | Freq: Two times a day (BID) | ORAL | Status: DC

## 2015-12-01 NOTE — Telephone Encounter (Signed)
Insurance denied coverage of colchicine, prefer brand name Mitigare which was sent to pharmacy.   Tyler Deis. Bonnye Fava, PharmD, BCPS, CPP Clinical Pharmacist Pager: [phone] Phone: [phone] 12/01/2015 10:30 AM

## 2015-12-23 ENCOUNTER — Other Ambulatory Visit (HOSPITAL_COMMUNITY): Payer: Self-pay | Admitting: *Deleted

## 2015-12-23 MED ORDER — NIACIN ER (ANTIHYPERLIPIDEMIC) 500 MG PO TBCR: EXTENDED_RELEASE_TABLET | ORAL | Status: DC

## 2015-12-30 ENCOUNTER — Other Ambulatory Visit: Payer: Self-pay | Admitting: *Deleted

## 2016-03-19 ENCOUNTER — Other Ambulatory Visit (HOSPITAL_COMMUNITY): Payer: Self-pay | Admitting: Internal Medicine

## 2016-05-10 ENCOUNTER — Other Ambulatory Visit (HOSPITAL_COMMUNITY): Payer: Self-pay | Admitting: Internal Medicine

## 2016-06-04 ENCOUNTER — Other Ambulatory Visit (HOSPITAL_COMMUNITY): Payer: Self-pay | Admitting: Internal Medicine

## 2016-06-16 ENCOUNTER — Other Ambulatory Visit (HOSPITAL_COMMUNITY): Payer: Self-pay | Admitting: *Deleted

## 2016-06-16 MED ORDER — DOFETILIDE 500 MCG PO CAPS: 500.0000 ug | ORAL_CAPSULE | Freq: Two times a day (BID) | ORAL | 6 refills | Status: DC

## 2016-07-13 ENCOUNTER — Telehealth: Payer: BLUE CROSS/BLUE SHIELD | Admitting: Nurse Practitioner

## 2016-07-13 DIAGNOSIS — J0101 Acute recurrent maxillary sinusitis: Secondary | ICD-10-CM

## 2016-07-13 MED ORDER — AMOXICILLIN-POT CLAVULANATE 875-125 MG PO TABS: 1.0000 | ORAL_TABLET | Freq: Two times a day (BID) | ORAL | 0 refills | Status: DC

## 2016-07-13 NOTE — Progress Notes (Signed)

## 2016-07-14 ENCOUNTER — Other Ambulatory Visit (HOSPITAL_COMMUNITY): Payer: Self-pay | Admitting: Internal Medicine

## 2016-07-28 ENCOUNTER — Other Ambulatory Visit (HOSPITAL_COMMUNITY): Payer: Self-pay | Admitting: Internal Medicine

## 2016-09-04 ENCOUNTER — Other Ambulatory Visit (HOSPITAL_COMMUNITY): Payer: Self-pay | Admitting: Internal Medicine

## 2016-09-06 ENCOUNTER — Telehealth: Payer: BLUE CROSS/BLUE SHIELD | Admitting: Family

## 2016-09-06 DIAGNOSIS — J028 Acute pharyngitis due to other specified organisms: Secondary | ICD-10-CM

## 2016-09-06 DIAGNOSIS — R05 Cough: Secondary | ICD-10-CM

## 2016-09-06 DIAGNOSIS — R059 Cough, unspecified: Secondary | ICD-10-CM

## 2016-09-06 NOTE — Progress Notes (Signed)
We are sorry that you are not feeling well.  Here is how we plan to help!  Based on what you have shared with me it looks like you have upper respiratory tract inflammation that has resulted in a significant cough.  Inflammation and infection in the upper respiratory tract is commonly called bronchitis and has four common causes:  Allergies, Viral Infections, Acid Reflux and Bacterial Infections.  Allergies, viruses and acid reflux are treated by controlling symptoms or eliminating the cause. An example might be a cough caused by taking certain blood pressure medications. You stop the cough by changing the medication. Another example might be a cough caused by acid reflux. Controlling the reflux helps control the cough.  Based on your presentation I believe you most likely have A cough due to a virus.  This is called viral bronchitis and is best treated by rest, plenty of fluids and control of the cough.  You may use Ibuprofen or Tylenol as directed to help your symptoms.     In addition you may use A non-prescription cough medication called Mucinex DM: take 2 tablets every 12 hours.   USE OF BRONCHODILATOR ("RESCUE") INHALERS: There is a risk from using your bronchodilator too frequently.  The risk is that over-reliance on a medication which only relaxes the muscles surrounding the breathing tubes can reduce the effectiveness of medications prescribed to reduce swelling and congestion of the tubes themselves.  Although you feel brief relief from the bronchodilator inhaler, your asthma may actually be worsening with the tubes becoming more swollen and filled with mucus.  This can delay other crucial treatments, such as oral steroid medications. If you need to use a bronchodilator inhaler daily, several times per day, you should discuss this with your provider.  There are probably better treatments that could be used to keep your asthma under control.     HOME CARE . Only take medications as instructed  by your medical team. . Complete the entire course of an antibiotic. . Drink plenty of fluids and get plenty of rest. . Avoid close contacts especially the very young and the elderly . Cover your mouth if you cough or cough into your sleeve. . Always remember to wash your hands . A steam or ultrasonic humidifier can help congestion.   GET HELP RIGHT AWAY IF: . You develop worsening fever. . You become short of breath . You cough up blood. . Your symptoms persist after you have completed your treatment plan MAKE SURE YOU   Understand these instructions.  Will watch your condition.  Will get help right away if you are not doing well or get worse.  Your e-visit answers were reviewed by a board certified advanced clinical practitioner to complete your personal care plan.  Depending on the condition, your plan could have included both over the counter or prescription medications. If there is a problem please reply  once you have received a response from your provider. Your safety is important to us.  If you have drug allergies check your prescription carefully.    You can use MyChart to ask questions about today's visit, request a non-urgent call back, or ask for a work or school excuse for 24 hours related to this e-Visit. If it has been greater than 24 hours you will need to follow up with your provider, or enter a new e-Visit to address those concerns. You will get an e-mail in the next two days asking about your experience.  I hope   that your e-visit has been valuable and will speed your recovery. Thank you for using e-visits.   

## 2016-10-12 ENCOUNTER — Telehealth: Payer: BLUE CROSS/BLUE SHIELD | Admitting: Family

## 2016-10-12 DIAGNOSIS — J209 Acute bronchitis, unspecified: Secondary | ICD-10-CM

## 2016-10-12 MED ORDER — BENZONATATE 100 MG PO CAPS: 100.0000 mg | ORAL_CAPSULE | Freq: Three times a day (TID) | ORAL | 0 refills | Status: DC | PRN

## 2016-10-12 MED ORDER — DOXYCYCLINE HYCLATE 100 MG PO TABS: 100.0000 mg | ORAL_TABLET | Freq: Two times a day (BID) | ORAL | 0 refills | Status: DC

## 2016-10-12 NOTE — Progress Notes (Signed)
We are sorry that you are not feeling well.  Here is how we plan to help!  Based on what you have shared with me it looks like you have upper respiratory tract inflammation that has resulted in a significant cough.  Inflammation and infection in the upper respiratory tract is commonly called bronchitis and has four common causes:  Allergies, Viral Infections, Acid Reflux and Bacterial Infections.  Allergies, viruses and acid reflux are treated by controlling symptoms or eliminating the cause. An example might be a cough caused by taking certain blood pressure medications. You stop the cough by changing the medication. Another example might be a cough caused by acid reflux. Controlling the reflux helps control the cough.  Based on your presentation I believe you most likely have A cough due to bacteria.  When patients have a fever and a productive cough with a change in color or increased sputum production, we are concerned about bacterial bronchitis.  If left untreated it can progress to pneumonia.  If your symptoms do not improve with your treatment plan it is important that you contact your provider.   I hve prescribed Doxycycline 100 mg twice a day for 7 days     In addition you may use A non-prescription cough medication called Robitussin DAC. Take 2 teaspoons every 8 hours or Delsym: take 2 teaspoons every 12 hours., A non-prescription cough medication called Mucinex DM: take 2 tablets every 12 hours. and A prescription cough medication called Tessalon Perles 100mg. You may take 1-2 capsules every 8 hours as needed for your cough.    USE OF BRONCHODILATOR ("RESCUE") INHALERS: There is a risk from using your bronchodilator too frequently.  The risk is that over-reliance on a medication which only relaxes the muscles surrounding the breathing tubes can reduce the effectiveness of medications prescribed to reduce swelling and congestion of the tubes themselves.  Although you feel brief relief from the  bronchodilator inhaler, your asthma may actually be worsening with the tubes becoming more swollen and filled with mucus.  This can delay other crucial treatments, such as oral steroid medications. If you need to use a bronchodilator inhaler daily, several times per day, you should discuss this with your provider.  There are probably better treatments that could be used to keep your asthma under control.     HOME CARE . Only take medications as instructed by your medical team. . Complete the entire course of an antibiotic. . Drink plenty of fluids and get plenty of rest. . Avoid close contacts especially the very young and the elderly . Cover your mouth if you cough or cough into your sleeve. . Always remember to wash your hands . A steam or ultrasonic humidifier can help congestion.   GET HELP RIGHT AWAY IF: . You develop worsening fever. . You become short of breath . You cough up blood. . Your symptoms persist after you have completed your treatment plan MAKE SURE YOU   Understand these instructions.  Will watch your condition.  Will get help right away if you are not doing well or get worse.  Your e-visit answers were reviewed by a board certified advanced clinical practitioner to complete your personal care plan.  Depending on the condition, your plan could have included both over the counter or prescription medications. If there is a problem please reply  once you have received a response from your provider. Your safety is important to us.  If you have drug allergies check your prescription   carefully.    You can use MyChart to ask questions about today's visit, request a non-urgent call back, or ask for a work or school excuse for 24 hours related to this e-Visit. If it has been greater than 24 hours you will need to follow up with your provider, or enter a new e-Visit to address those concerns. You will get an e-mail in the next two days asking about your experience.  I hope that  your e-visit has been valuable and will speed your recovery. Thank you for using e-visits.   

## 2016-10-25 ENCOUNTER — Ambulatory Visit
Admission: RE | Admit: 2016-10-25 | Discharge: 2016-10-25 | Disposition: A | Discharge status: Home / Self Care | Payer: 59 | Source: Ambulatory Visit | Attending: Family Medicine | Admitting: Family Medicine

## 2016-10-25 ENCOUNTER — Other Ambulatory Visit: Payer: Self-pay | Admitting: Family Medicine

## 2016-10-25 DIAGNOSIS — R062 Wheezing: Secondary | ICD-10-CM

## 2016-10-28 ENCOUNTER — Other Ambulatory Visit (HOSPITAL_COMMUNITY): Payer: Self-pay | Admitting: Internal Medicine

## 2016-11-10 ENCOUNTER — Other Ambulatory Visit (HOSPITAL_COMMUNITY): Payer: Self-pay | Admitting: Internal Medicine

## 2016-11-24 ENCOUNTER — Telehealth (HOSPITAL_COMMUNITY): Payer: Self-pay | Admitting: Vascular Surgery

## 2016-11-24 ENCOUNTER — Other Ambulatory Visit (HOSPITAL_COMMUNITY): Payer: Self-pay | Admitting: *Deleted

## 2016-11-24 MED ORDER — APIXABAN 5 MG PO TABS: 5.0000 mg | ORAL_TABLET | Freq: Two times a day (BID) | ORAL | 6 refills | Status: DC

## 2016-11-24 NOTE — Telephone Encounter (Signed)
Left pt message to make f/u appt w/ DB  

## 2016-12-22 ENCOUNTER — Encounter (HOSPITAL_COMMUNITY): Payer: Self-pay | Admitting: Internal Medicine

## 2016-12-22 ENCOUNTER — Ambulatory Visit (HOSPITAL_COMMUNITY)
Admission: RE | Admit: 2016-12-22 | Discharge: 2016-12-22 | Disposition: A | Discharge status: Home / Self Care | Payer: 59 | Source: Ambulatory Visit | Attending: Internal Medicine | Admitting: Internal Medicine

## 2016-12-22 VITALS — BP 158/90 | HR 71 | Wt >= 6400 oz

## 2016-12-22 DIAGNOSIS — Z79899 Other long term (current) drug therapy: Secondary | ICD-10-CM | POA: Insufficient documentation

## 2016-12-22 DIAGNOSIS — Z7901 Long term (current) use of anticoagulants: Secondary | ICD-10-CM | POA: Insufficient documentation

## 2016-12-22 DIAGNOSIS — I480 Paroxysmal atrial fibrillation: Secondary | ICD-10-CM | POA: Insufficient documentation

## 2016-12-22 DIAGNOSIS — L4050 Arthropathic psoriasis, unspecified: Secondary | ICD-10-CM | POA: Diagnosis not present

## 2016-12-22 DIAGNOSIS — I10 Essential (primary) hypertension: Secondary | ICD-10-CM | POA: Diagnosis not present

## 2016-12-22 DIAGNOSIS — Z8679 Personal history of other diseases of the circulatory system: Secondary | ICD-10-CM | POA: Diagnosis not present

## 2016-12-22 DIAGNOSIS — I482 Chronic atrial fibrillation, unspecified: Secondary | ICD-10-CM

## 2016-12-22 DIAGNOSIS — Z6841 Body Mass Index (BMI) 40.0 and over, adult: Secondary | ICD-10-CM | POA: Insufficient documentation

## 2016-12-22 LAB — BASIC METABOLIC PANEL
Anion gap: 8 (ref 5–15)
BUN: 19 mg/dL (ref 6–20)
CALCIUM: 8.7 mg/dL — AB (ref 8.9–10.3)
CO2: 22 mmol/L (ref 22–32)
Chloride: 107 mmol/L (ref 101–111)
Creatinine, Ser: 0.69 mg/dL (ref 0.61–1.24)
GFR calc Af Amer: >60 mL/min (ref >60)
Glucose, Bld: 112 mg/dL — ABNORMAL HIGH (ref 65–99)
POTASSIUM: 3.9 mmol/L (ref 3.5–5.1)
SODIUM: 137 mmol/L (ref 135–145)

## 2016-12-22 LAB — MAGNESIUM: MAGNESIUM: 1.9 mg/dL (ref 1.7–2.4)

## 2016-12-22 MED ORDER — SPIRONOLACTONE 25 MG PO TABS: 12.5000 mg | ORAL_TABLET | Freq: Every day | ORAL | 3 refills | Status: DC

## 2016-12-22 MED ORDER — DOFETILIDE 500 MCG PO CAPS: 500.0000 ug | ORAL_CAPSULE | Freq: Two times a day (BID) | ORAL | 3 refills | Status: DC

## 2016-12-22 NOTE — Patient Instructions (Signed)
Labs today (will call for abnormal results, otherwise no news is good news)  START taking Spironolactone 12.5 mg (0.5 Tablet) Once Daily  Follow up in 1 week with labs and EKG  Follow up with Dr. Gala Romney in 6 Months.  We will call you to schedule appointment.

## 2016-12-22 NOTE — Progress Notes (Signed)
Patient ID: Mark Born., male   DOB: [birth year], 56 y.o.   MRN: [id number] . Advanced Heart Failure Clinic Note   Subjective:    Mark Gonzalez is a very pleasant 56 y/o male with a history of psoriatic arthritis, obesity, hypertension, and atrial fibrillation. In 2009 he was admitted for Tikosyn load followed by successful DC-CV. He continued on 500 mcg Tikosyn bid but later went back into A fib. He was referred to [facility] for an ablation. He had AF ablation at [facility] and was maintained in NSR on flecainide but went back into A fib.  Eventually failed Flecainide with multiple recurrences of AF. We re-started Tikosyn in 06/2013. He had an early recurrence of AF in about 2 weeks. Had 4 day episode of AF around Xmas but then went back into NSR and has held for the most part  Admitted several times in March 2015 with acute pericarditis. Treated with colchicine and steroids. Had recurrence and readmitted. Echo with moderate pericardial effusion. Started on prednisone 60 daily. Effusion resolved after 1 week. Remicade stopped as it was thought that it might be causative.   Here for f/u: Doing well. Has an episode of AF about every 3-4 months. On Tikosyn 500 bid. Having trouble with cost and looking into generic dofetilide. Typically lasts < 48 hours. Returns to NSR on its own.  No bleeding on Eliqius. Mild LE edema. Has had lots of LE edema. Worse when standing. Resolves over night. Watching fluid intake. Increased lasix intake 3x-> 4x week. Urine output doesn't increase much with lasix. Weight stable.    Current Outpatient Prescriptions on File Prior to Encounter  Medication Sig Dispense Refill  . apixaban (ELIQUIS) 5 MG TABS tablet Take 1 tablet (5 mg total) by mouth 2 (two) times daily. 60 tablet 6  . cholecalciferol (VITAMIN D) 1000 UNITS tablet Take 1,000 Units by mouth daily.     . diclofenac (VOLTAREN) 75 MG EC tablet Take 75 mg by mouth 2 (two) times daily.  0  . Diclofenac-Misoprostol (ARTHROTEC)  75-0.2 MG TBEC Take 75 mg by mouth 2 (two) times daily.    Marland Kitchen dofetilide (TIKOSYN) 500 MCG capsule Take 1 capsule (500 mcg total) by mouth every 12 (twelve) hours. 60 capsule 6  . fish oil-omega-3 fatty acids 1000 MG capsule Take 1 g by mouth 2 (two) times daily.     . furosemide (LASIX) 20 MG tablet Take 1 tablet (20 mg total) by mouth daily. 90 tablet 3  . lovastatin (MEVACOR) 20 MG tablet take 1 tablet by mouth at bedtime 30 tablet 7  . Magnesium 400 MG CAPS Take 400 mg by mouth daily.    . Multiple Vitamin (MULTIVITAMIN WITH MINERALS) TABS tablet Take 1 tablet by mouth daily.    . niacin (NIASPAN) 500 MG CR tablet TAKE 1 TABLET BY MOUTH AT BEDTIME AS DIRECTED AND TAKE 1 ASPIRIN 1/2 HOUR PRIOR TO NIASPAN 30 tablet 12  . pantoprazole (PROTONIX) 40 MG tablet take 1 tablet by mouth twice a day BEFORE A MEAL 60 tablet 3  . polyethylene glycol (MIRALAX / GLYCOLAX) packet Take 17 g by mouth daily as needed for moderate constipation. 14 each 0  . potassium chloride SA (K-DUR,KLOR-CON) 20 MEQ tablet take 2 tablets by mouth TWO TIMES A WEEK ON MONDAY AND THURSDAY NIGHTS 30 tablet 6  . Probiotic Product (PROBIOTIC PO) Take 1 tablet by mouth daily.    Marland Kitchen saccharomyces boulardii (FLORASTOR) 250 MG capsule Take 1 capsule (250 mg  total) by mouth 2 (two) times daily. 20 capsule 0  . benzonatate (TESSALON PERLES) 100 MG capsule Take 1 capsule (100 mg total) by mouth 3 (three) times daily as needed. (Patient not taking: Reported on 12/22/2016) 20 capsule 0  . Certolizumab Pegol (CIMZIA PREFILLED) 2 X 200 MG/ML KIT Inject 200 mg into the skin every 14 (fourteen) days. Last inject 01/09/15     No current facility-administered medications on file prior to encounter.     Objective:       Weight change: Vitals:   12/22/16 1055  BP: (!) 158/90  Pulse: 71  SpO2: 99%  Weight: (!) 438 lb (198.7 kg)   Filed Weights   12/22/16 1055  Weight: (!) 438 lb (198.7 kg)     Physical Exam: General:  Obese male. No  resp difficulty HEENT: normal Neck: supple. Hard to see JVP. Carotids 2+ bilat; no bruits. No lymphadenopathy or thryomegaly appreciated. Cor: PMI nondisplaced. Regular rate & rhythm. No rubs, gallops or murmurs. Lungs: clear Abdomen: obese  soft, nontender, nondistended. No hepatosplenomegaly. No bruits or masses. Good bowel sounds. Extremities: no cyanosis, clubbing, rash, trace edema Neuro: alert & orientedx3, cranial nerves grossly intact. moves all 4 extremities w/o difficulty. Affect pleasant   Imaging: No results found.  ECG: NSR 61 No ST-T wave abnormalities. QTC 457 ms   Assessment/Plan:   1. PAF in NSR on Tikosyn - intermittent breakthroughs    --s/p previous AF abaltion at [facility]     --Stable from cardiac perspective. Can switch to generic dofetilide. Will need ECG after switch (Discussed with EP).      --With his weight discussed possibly switching to warfarin to ensure adequate AC but he would like to stay on Eliquis 2. Morbid obesity    --Agree with plan for weight loss program with [facility]. 3. Psoriatic Arthritis  4. HTN     --poorly controlled    --will add spiro 12.5 daily. Check labs 1 week 5. H/o pericarditis felt to be related to Remicade 2015   Glori Bickers, MD  11:33 AM

## 2016-12-23 ENCOUNTER — Other Ambulatory Visit (HOSPITAL_COMMUNITY): Payer: Self-pay | Admitting: Internal Medicine

## 2016-12-29 ENCOUNTER — Ambulatory Visit (HOSPITAL_COMMUNITY)
Admission: RE | Admit: 2016-12-29 | Discharge: 2016-12-29 | Disposition: A | Discharge status: Home / Self Care | Payer: 59 | Source: Ambulatory Visit | Attending: Cardiology | Admitting: Cardiology

## 2016-12-29 DIAGNOSIS — I509 Heart failure, unspecified: Secondary | ICD-10-CM | POA: Insufficient documentation

## 2016-12-29 DIAGNOSIS — I482 Chronic atrial fibrillation, unspecified: Secondary | ICD-10-CM

## 2016-12-29 LAB — BASIC METABOLIC PANEL
Anion gap: 9 (ref 5–15)
BUN: 13 mg/dL (ref 6–20)
CHLORIDE: 107 mmol/L (ref 101–111)
CO2: 22 mmol/L (ref 22–32)
CREATININE: 0.82 mg/dL (ref 0.61–1.24)
Calcium: 9 mg/dL (ref 8.9–10.3)
GFR calc Af Amer: >60 mL/min (ref >60)
GFR calc non Af Amer: >60 mL/min (ref >60)
GLUCOSE: 108 mg/dL — AB (ref 65–99)
Potassium: 4.5 mmol/L (ref 3.5–5.1)
Sodium: 138 mmol/L (ref 135–145)

## 2017-01-08 ENCOUNTER — Other Ambulatory Visit (HOSPITAL_COMMUNITY): Payer: Self-pay | Admitting: Internal Medicine

## 2017-02-06 ENCOUNTER — Other Ambulatory Visit (HOSPITAL_COMMUNITY): Payer: Self-pay | Admitting: Internal Medicine

## 2017-03-01 ENCOUNTER — Telehealth (HOSPITAL_COMMUNITY): Payer: Self-pay | Admitting: Pharmacist

## 2017-03-01 NOTE — Telephone Encounter (Signed)
Received fax from [facility] on 02/27/17 for a PA request for Eliquis. Completed request through CoverMyMeds. Received fax from [employer] 02/28/17 that this medication is a covered drug by the plan and does not require a PA. Called pharmacy to let them know. They reported patient picked up medication yesterday (02/28/17).   Remi Haggard, PharmD, BCPS 03/01/2017 1:41 PM

## 2017-04-25 ENCOUNTER — Other Ambulatory Visit (HOSPITAL_COMMUNITY): Payer: Self-pay | Admitting: Cardiology

## 2017-04-25 MED ORDER — LOVASTATIN 20 MG PO TABS: 20.0000 mg | ORAL_TABLET | Freq: Every day | ORAL | 11 refills | Status: DC

## 2017-04-25 NOTE — Addendum Note (Signed)
Addended by: Theresia Bough on: 04/25/2017 09:13 AM   Modules accepted: Orders

## 2017-05-09 ENCOUNTER — Telehealth (HOSPITAL_COMMUNITY): Payer: Self-pay | Admitting: *Deleted

## 2017-05-09 ENCOUNTER — Other Ambulatory Visit (HOSPITAL_COMMUNITY): Payer: Self-pay | Admitting: *Deleted

## 2017-05-09 MED ORDER — PANTOPRAZOLE SODIUM 40 MG PO TBEC: DELAYED_RELEASE_TABLET | ORAL | 6 refills | Status: DC

## 2017-05-09 NOTE — Telephone Encounter (Signed)
Received surgical clearance from [facility] for patient to have a colonoscopy.  Dr. Gala Romney recommends patient to hold Eliquis for 48 hour prior to procedure.  Clearance faxed today to [phone].

## 2017-06-19 ENCOUNTER — Other Ambulatory Visit (HOSPITAL_COMMUNITY): Payer: Self-pay | Admitting: Internal Medicine

## 2017-06-21 ENCOUNTER — Other Ambulatory Visit (HOSPITAL_COMMUNITY): Payer: Self-pay | Admitting: *Deleted

## 2017-06-21 MED ORDER — APIXABAN 5 MG PO TABS: 5.0000 mg | ORAL_TABLET | Freq: Two times a day (BID) | ORAL | 6 refills | Status: DC

## 2017-07-13 ENCOUNTER — Other Ambulatory Visit (HOSPITAL_COMMUNITY): Payer: Self-pay | Admitting: Internal Medicine

## 2017-07-28 ENCOUNTER — Other Ambulatory Visit (HOSPITAL_COMMUNITY): Payer: Self-pay | Admitting: Cardiology

## 2017-07-28 MED ORDER — POTASSIUM CHLORIDE CRYS ER 20 MEQ PO TBCR: EXTENDED_RELEASE_TABLET | ORAL | 6 refills | Status: DC

## 2017-08-13 ENCOUNTER — Ambulatory Visit (INDEPENDENT_AMBULATORY_CARE_PROVIDER_SITE_OTHER): Payer: 59

## 2017-08-13 ENCOUNTER — Encounter (HOSPITAL_COMMUNITY): Payer: Self-pay | Admitting: Emergency Medicine

## 2017-08-13 ENCOUNTER — Ambulatory Visit (HOSPITAL_COMMUNITY)
Admission: EM | Admit: 2017-08-13 | Discharge: 2017-08-13 | Disposition: A | Discharge status: Home / Self Care | Payer: 59 | Attending: Internal Medicine | Admitting: Internal Medicine

## 2017-08-13 DIAGNOSIS — S82001A Unspecified fracture of right patella, initial encounter for closed fracture: Secondary | ICD-10-CM

## 2017-08-13 DIAGNOSIS — M25561 Pain in right knee: Secondary | ICD-10-CM

## 2017-08-13 NOTE — ED Provider Notes (Signed)
[place]    CSN: [id number] Arrival date & time: 08/13/17  1817     History   Chief Complaint Chief Complaint  Patient presents with  . Knee Pain    HPI Mark Gonzalez. is a 56 y.o. male presenting with acute right knee pain. He fell onto a concrete side walk last night at home after it had been raining/icing. Believes his right knee took the bulk of the fall. Took a few minutes to get up but able to get up on his own and ambulate. Taking Tylenol for pain. States he has full ROM but just has to go slow. Pain mainly over top of knee. Denies sensations of instability, popping, clicking, catching.   HPI  Past Medical History:  Diagnosis Date  . Atrial flutter ([place])    a. s/p DCCV 08/16/2012.  . Cellulitis   . Cellulitis of leg, left 09/24/2015  . DVT (deep venous thrombosis) ([place]) 09/2014   "may have just been cellulitis"   . Hypercholesterolemia   . Hypertension   . Morbid obesity ([place])   . NICM (nonischemic cardiomyopathy) ([place])    a. EF 45-50% in setting of AF in past; b. Neg MV;  c. 04/2010 Echo: EF 55-60%;  d. 11/2011 Cath: nl cors.  . Paroxysmal atrial fibrillation ([place])    a. s/p DCCV 09/2007, 11/2007, 04/2013 --> flecainide and chronic coumadin.  . Pneumonia ~ 1995  . Psoriasis    "leasions multiple places on my body; under control w/RX now" (09/25/2015)  . Psoriatic arthritis ([place])    "primarily in my hands" (09/25/2015)    Patient Active Problem List   Diagnosis Date Noted  . AF (atrial fibrillation) ([place]) 09/25/2015  . Cellulitis of leg 09/24/2015  . Pericardial effusion 10/15/2013  . Pericarditis 10/11/2013  . Acute pericarditis, unspecified 10/01/2013  . Atrial flutter ([place]) 08/16/2012  . Wound, open, buttock with complication 21/22/4825  . HYPERLIPIDEMIA-MIXED 04/22/2010  . Morbid obesity ([place]) 05/15/2008  . Essential hypertension, benign 05/15/2008  . Atrial fibrillation ([place]) 05/15/2008    Past Surgical History:  Procedure Laterality Date   . APPENDECTOMY    . ATRIAL FIBRILLATION ABLATION  07/2011  . CARDIAC CATHETERIZATION   11/2011  . CARDIOVERSION     "I've probably had 10" (09/25/2015)  . CARDIOVERSION  08/16/2012   Procedure: CARDIOVERSION;  Surgeon: Jolaine Artist, MD;  Location: [facility] ENDOSCOPY;  Service: Cardiovascular;  Laterality: N/A;  . CARDIOVERSION N/A 06/05/2013   Procedure: CARDIOVERSION;  Surgeon: Jolaine Artist, MD;  Location: [facility];  Service: Cardiovascular;  Laterality: N/A;  . CARDIOVERSION N/A 07/11/2014   Procedure: CARDIOVERSION;  Surgeon: Jolaine Artist, MD;  Location: [facility] ENDOSCOPY;  Service: Cardiovascular;  Laterality: N/A;  . FRACTURE SURGERY    . LEFT HEART CATHETERIZATION WITH CORONARY ANGIOGRAM N/A 12/22/2011   Procedure: LEFT HEART CATHETERIZATION WITH CORONARY ANGIOGRAM;  Surgeon: Jolaine Artist, MD;  Location: [facility] CATH LAB;  Service: Cardiovascular;  Laterality: N/A;  . ORIF METATARSAL FRACTURE  ~ 2001   "5th"  . TONSILLECTOMY         Home Medications    Prior to Admission medications   Medication Sig Start Date End Date Taking? Authorizing Provider  Apremilast (OTEZLA) 30 MG TABS Take 30 mg by mouth 2 (two) times daily.   Yes [provider]  cholecalciferol (VITAMIN D) 1000 UNITS tablet Take 1,000 Units by mouth daily.    Yes [provider]  diclofenac (VOLTAREN) 75 MG EC tablet  Take 75 mg by mouth 2 (two) times daily. 09/17/15  Yes [provider]  dofetilide (TIKOSYN) 500 MCG capsule Take 1 capsule (500 mcg total) by mouth 2 (two) times daily. Needs office visit 07/14/17  Yes Bensimhon, Shaune Pascal, MD  fish oil-omega-3 fatty acids 1000 MG capsule Take 1 g by mouth 2 (two) times daily.    Yes [provider]  furosemide (LASIX) 20 MG tablet take 1 tablet by mouth once daily 02/06/17  Yes Bensimhon, Shaune Pascal, MD  irbesartan (AVAPRO) 75 MG tablet Take 75 mg by mouth daily.   Yes [provider]  lovastatin (MEVACOR) 20 MG tablet Take 1  tablet (20 mg total) by mouth at bedtime. 04/25/17  Yes Bensimhon, Shaune Pascal, MD  Magnesium 400 MG CAPS Take 400 mg by mouth daily.   Yes [provider]  Multiple Vitamin (MULTIVITAMIN WITH MINERALS) TABS tablet Take 1 tablet by mouth daily.   Yes [provider]  NIASPAN 500 MG CR tablet take 1 tablet by mouth at bedtime as directed and take 1 ASPIRIN 1/2 HOUR PRIOR TO NIASPAN 01/10/17  Yes Bensimhon, Shaune Pascal, MD  pantoprazole (PROTONIX) 40 MG tablet take 1 tablet by mouth twice a day BEFORE A MEAL 05/09/17  Yes Bensimhon, Shaune Pascal, MD  potassium chloride SA (K-DUR,KLOR-CON) 20 MEQ tablet take 2 tablets by mouth TWO TIMES A WEEK ON MONDAY AND THURSDAY NIGHTS 07/28/17  Yes Bensimhon, Shaune Pascal, MD  Probiotic Product (PROBIOTIC PO) Take 1 tablet by mouth daily.   Yes [provider]  spironolactone (ALDACTONE) 25 MG tablet Take 0.5 tablets (12.5 mg total) by mouth daily. 12/22/16  Yes Bensimhon, Shaune Pascal, MD  valsartan (DIOVAN) 160 MG tablet Take 160 mg by mouth daily.   Yes [provider]  apixaban (ELIQUIS) 5 MG TABS tablet Take 1 tablet (5 mg total) by mouth 2 (two) times daily. 06/21/17   Bensimhon, Shaune Pascal, MD  benzonatate (TESSALON PERLES) 100 MG capsule Take 1 capsule (100 mg total) by mouth 3 (three) times daily as needed. Patient not taking: Reported on 12/22/2016 10/12/16   Sharion Balloon, FNP  Certolizumab Pegol (CIMZIA PREFILLED) 2 X 200 MG/ML KIT Inject 200 mg into the skin every 14 (fourteen) days. Last inject 01/09/15    [provider]  Diclofenac-Misoprostol (ARTHROTEC) 75-0.2 MG TBEC Take 75 mg by mouth 2 (two) times daily.    [provider]  polyethylene glycol (MIRALAX / GLYCOLAX) packet Take 17 g by mouth daily as needed for moderate constipation. 09/27/15   Lavina Hamman, MD  saccharomyces boulardii (FLORASTOR) 250 MG capsule Take 1 capsule (250 mg total) by mouth 2 (two) times daily. 09/27/15   Lavina Hamman, MD    Family  History Family History  Problem Relation Age of Onset  . Heart disease Neg Hx     Social History Social History   Tobacco Use  . Smoking status: Former Smoker    Packs/day: 1.00    Years: 8.00    Pack years: 8.00    Types: Cigarettes    Last attempt to quit: 08/01/1990    Years since quitting: 27.0  . Smokeless tobacco: Never Used  Substance Use Topics  . Alcohol use: Yes    Alcohol/week: 1.2 oz    Types: 2 Cans of beer per week  . Drug use: No     Allergies   Lisinopril   Review of Systems Review of Systems  Constitutional: Negative for fever.  Eyes: Negative for visual disturbance.  Respiratory: Negative for shortness of breath.   Cardiovascular: Negative for chest pain.  Gastrointestinal: Negative for nausea and vomiting.  Musculoskeletal: Positive for arthralgias. Negative for gait problem and myalgias.       Knee pain  Skin: Negative for pallor, rash and wound.  Neurological: Negative for dizziness, light-headedness and headaches.     Physical Exam Triage Vital Signs ED Triage Vitals  Enc Vitals Group     BP 08/13/17 1849 130/78     Pulse Rate 08/13/17 1849 99     Resp 08/13/17 1849 20     Temp 08/13/17 1849 97.8 F (36.6 C)     Temp Source 08/13/17 1849 Oral     SpO2 08/13/17 1849 99 %     Weight --      Height --      Head Circumference --      Peak Flow --      Pain Score 08/13/17 1845 3     Pain Loc --      Pain Edu? --      Excl. in [place]? --    No data found.  Updated Vital Signs BP 130/78 (BP Location: Left Arm)   Pulse 99   Temp 97.8 F (36.6 C) (Oral)   Resp 20   SpO2 99%    Physical Exam  Constitutional: He is oriented to person, place, and time. He appears well-developed and well-nourished.  HENT:  Head: Normocephalic and atraumatic.  Eyes: Conjunctivae are normal.  Neck: Neck supple.  Cardiovascular: Normal rate.  Pulmonary/Chest: Effort normal. No respiratory distress.  Abdominal: Soft. There is no tenderness.    Musculoskeletal: He exhibits no edema.  Right knee: no evidence of effusion, mild tenderness to palpation over patella. No varus or valgus laxity, Negative lach- limited due to size of leg. Negative anterior and posterior drawer. Negative MC Murrays.  Gait without abnormalities  Neurological: He is alert and oriented to person, place, and time.  Skin: Skin is warm and dry.  Multiple erythematous scaling plaques over right knee- history of psoriasis; 1 ecchymosis to medial superior aspect of knee  Psychiatric: He has a normal mood and affect.  Nursing note and vitals reviewed.    UC Treatments / Results  Labs (all labs ordered are listed, but only abnormal results are displayed) Labs Reviewed - No data to display  EKG  EKG Interpretation None       Radiology No results found.  Procedures Procedures (including critical care time)  Medications Ordered in UC Medications - No data to display   Initial Impression / Assessment and Plan / UC Course  I have reviewed the triage vital signs and the nursing notes.  Pertinent labs & imaging results that were available during my care of the patient were reviewed by me and considered in my medical decision making (see chart for details).     Possible fracture on Xray, unclear if this is old or new. Please follow up with orthopedics. Knee sleeve for support. OTC NSAIDs, RICE. Discussed strict return precautions. Patient verbalized understanding and is agreeable with plan.   Final Clinical Impressions(s) / UC Diagnoses   Final diagnoses:  None    ED Discharge Orders    None       Controlled Substance Prescriptions [place] Controlled Substance Registry consulted? Not Applicable   Janith Lima, [place] 08/13/17 2016

## 2017-08-13 NOTE — ED Triage Notes (Signed)
PT C/O: reports he fell and slipped yest going outside of his home... Does not know how he inj his right knee but pain increases w/activity or when he bends it.   TAKING MEDS: Tylenol   A&O x4... NAD... Ambulatory

## 2017-08-13 NOTE — ED Notes (Signed)
Staff was about to to apply pt Knee Sleeve to pt and he stated he would like to take knee sleeve home and apply it himself.

## 2017-08-13 NOTE — Discharge Instructions (Signed)
Xray showed a possible fracture- but unclear if this is new or old as it is not as clear as a typical fracture.   Please wear knee sleeve for support.   Antiinflammatories for pain- Ibuprofen up to 800 mg with food every 8 hours. May supplement with Tylenol [phone] mg.   Please follow up with orthopedics for further evaluation.

## 2017-10-02 ENCOUNTER — Other Ambulatory Visit (HOSPITAL_COMMUNITY): Payer: Self-pay | Admitting: Internal Medicine

## 2017-11-02 ENCOUNTER — Other Ambulatory Visit (HOSPITAL_COMMUNITY): Payer: Self-pay | Admitting: Internal Medicine

## 2017-12-01 ENCOUNTER — Encounter (HOSPITAL_COMMUNITY): Payer: Self-pay | Admitting: Cardiology

## 2017-12-01 ENCOUNTER — Other Ambulatory Visit (HOSPITAL_COMMUNITY): Payer: Self-pay | Admitting: Internal Medicine

## 2017-12-03 ENCOUNTER — Other Ambulatory Visit (HOSPITAL_COMMUNITY): Payer: Self-pay | Admitting: Internal Medicine

## 2017-12-05 ENCOUNTER — Encounter (HOSPITAL_COMMUNITY): Payer: Self-pay | Admitting: Cardiology

## 2017-12-07 ENCOUNTER — Other Ambulatory Visit (HOSPITAL_COMMUNITY): Payer: Self-pay | Admitting: Cardiology

## 2017-12-07 MED ORDER — DOFETILIDE 500 MCG PO CAPS: 500.0000 ug | ORAL_CAPSULE | Freq: Two times a day (BID) | ORAL | 0 refills | Status: DC

## 2017-12-07 NOTE — Telephone Encounter (Signed)
-----   Message from Lucilla Edin sent at 12/07/2017  3:25 PM EDT ----- Regarding: Appt Hey  You sent pt a My Chart message to schedule a f/u with DB.  Pt called and scheduled for 01/26/18 @10 :40  Thanks!

## 2017-12-07 NOTE — Telephone Encounter (Signed)
Will refill all meds until upcoming appt

## 2017-12-31 ENCOUNTER — Other Ambulatory Visit (HOSPITAL_COMMUNITY): Payer: Self-pay | Admitting: Internal Medicine

## 2018-01-10 ENCOUNTER — Other Ambulatory Visit (HOSPITAL_COMMUNITY): Payer: Self-pay | Admitting: Internal Medicine

## 2018-01-24 ENCOUNTER — Ambulatory Visit (HOSPITAL_COMMUNITY)
Admission: RE | Admit: 2018-01-24 | Discharge: 2018-01-24 | Disposition: A | Discharge status: Home / Self Care | Payer: 59 | Source: Ambulatory Visit | Attending: Internal Medicine | Admitting: Internal Medicine

## 2018-01-24 VITALS — BP 124/78 | HR 68 | Wt 281.8 lb

## 2018-01-24 DIAGNOSIS — Z79899 Other long term (current) drug therapy: Secondary | ICD-10-CM | POA: Diagnosis not present

## 2018-01-24 DIAGNOSIS — Z9889 Other specified postprocedural states: Secondary | ICD-10-CM | POA: Diagnosis not present

## 2018-01-24 DIAGNOSIS — E785 Hyperlipidemia, unspecified: Secondary | ICD-10-CM | POA: Insufficient documentation

## 2018-01-24 DIAGNOSIS — Z7982 Long term (current) use of aspirin: Secondary | ICD-10-CM | POA: Diagnosis not present

## 2018-01-24 DIAGNOSIS — I480 Paroxysmal atrial fibrillation: Secondary | ICD-10-CM | POA: Insufficient documentation

## 2018-01-24 DIAGNOSIS — I319 Disease of pericardium, unspecified: Secondary | ICD-10-CM | POA: Diagnosis not present

## 2018-01-24 DIAGNOSIS — Z7901 Long term (current) use of anticoagulants: Secondary | ICD-10-CM | POA: Diagnosis not present

## 2018-01-24 DIAGNOSIS — L4050 Arthropathic psoriasis, unspecified: Secondary | ICD-10-CM | POA: Diagnosis not present

## 2018-01-24 DIAGNOSIS — I10 Essential (primary) hypertension: Secondary | ICD-10-CM | POA: Insufficient documentation

## 2018-01-24 DIAGNOSIS — Z6838 Body mass index (BMI) 38.0-38.9, adult: Secondary | ICD-10-CM | POA: Diagnosis not present

## 2018-01-24 LAB — BASIC METABOLIC PANEL
Anion gap: 6 (ref 5–15)
BUN: 25 mg/dL — AB (ref 6–20)
CALCIUM: 9 mg/dL (ref 8.9–10.3)
CO2: 26 mmol/L (ref 22–32)
CREATININE: 0.72 mg/dL (ref 0.61–1.24)
Chloride: 104 mmol/L (ref 98–111)
Glucose, Bld: 109 mg/dL — ABNORMAL HIGH (ref 70–99)
Potassium: 4.5 mmol/L (ref 3.5–5.1)
SODIUM: 136 mmol/L (ref 135–145)

## 2018-01-24 LAB — MAGNESIUM: MAGNESIUM: 2 mg/dL (ref 1.7–2.4)

## 2018-01-24 NOTE — Patient Instructions (Signed)
Your physician recommends that you return for a FASTING lipid profile: 2-3 weeks  We will contact you in 1 year to schedule your next appointment.

## 2018-01-24 NOTE — Progress Notes (Signed)
Patient ID: [name], male   DOB: Sep 10, 1960, 57 y.o.   MRN: [id number] . Advanced Heart Failure Clinic Note   Subjective:    Mark Gonzalez is a very pleasant 57 y/o male with a history of psoriatic arthritis, obesity, hypertension, and atrial fibrillation. In 2009 he was admitted for Tikosyn load followed by successful DC-CV. He continued on 500 mcg Tikosyn bid but later went back into A fib. He was referred to [facility] for an ablation. He had AF ablation at [facility] and was maintained in NSR on flecainide but went back into A fib.  Eventually failed Flecainide with multiple recurrences of AF. We re-started Tikosyn in 06/2013. He had an early recurrence of AF in about 2 weeks. Had 4 day episode of AF around Xmas but then went back into NSR and has held for the most part  Admitted several times in March 2015 with acute pericarditis. Treated with colchicine and steroids. Had recurrence and readmitted. Echo with moderate pericardial effusion. Started on prednisone 60 daily. Effusion resolved after 1 week. Remicade stopped as it was thought that it might be causative.   Here for regular follow up. Last seen 11/2016. He has lost substantial weight (~150 pounds) using [facility]. Overall doing really well. Last time in afib was September 2018 for 6 hours with spontaneous return to NSR. Remains on tikosyn. Deos have some occasiona palpitations. But has Apple Watch and no AF. No bleeding on eliquis. SBP usually 110s. Has mild BLE edema at end of day. He exercises 4-5x/week on bicycle outside. No SOB, CP, orthopnea or PND. Takes 40 mg lasix on Monday and Thursday. No SOB.    Current Outpatient Medications on File Prior to Encounter  Medication Sig Dispense Refill  . cholecalciferol (VITAMIN D) 1000 UNITS tablet Take 1,000 Units by mouth daily.     . diclofenac (VOLTAREN) 75 MG EC tablet Take 75 mg by mouth 2 (two) times daily.  0  . dofetilide (TIKOSYN) 500 MCG capsule Take 1 capsule (500 mcg total) by mouth  2 (two) times daily. Last refills without OV [id number] 60 capsule 0  . ELIQUIS 5 MG TABS tablet TAKE 1 TABLET(5 MG) BY MOUTH TWICE DAILY 60 tablet 0  . fish oil-omega-3 fatty acids 1000 MG capsule Take 1 g by mouth 2 (two) times daily.     . furosemide (LASIX) 20 MG tablet take 1 tablet by mouth once daily 90 tablet 3  . golimumab (SIMPONI ARIA) 50 MG/4ML SOLN injection Inject into the vein.    Marland Kitchen irbesartan (AVAPRO) 75 MG tablet Take 75 mg by mouth daily.    Marland Kitchen lovastatin (MEVACOR) 20 MG tablet Take 1 tablet (20 mg total) by mouth at bedtime. 30 tablet 11  . Magnesium 400 MG CAPS Take 400 mg by mouth daily.    . Multiple Vitamin (MULTIVITAMIN WITH MINERALS) TABS tablet Take 1 tablet by mouth daily.    Marland Kitchen NIASPAN 500 MG CR tablet take 1 tablet by mouth at bedtime as directed and take 1 ASPIRIN 1/2 HOUR PRIOR TO NIASPAN 30 tablet 12  . potassium chloride SA (K-DUR,KLOR-CON) 20 MEQ tablet take 2 tablets by mouth TWO TIMES A WEEK ON MONDAY AND THURSDAY NIGHTS 30 tablet 6  . Probiotic Product (PROBIOTIC PO) Take 1 tablet by mouth daily.    Marland Kitchen spironolactone (ALDACTONE) 25 MG tablet TAKE 1/2 TABLET(12.5 MG) BY MOUTH DAILY 15 tablet 0   No current facility-administered medications on file prior to encounter.  Objective:       Weight change: Vitals:   01/24/18 1001  BP: 124/78  Pulse: 68  SpO2: 98%  Weight: 281 lb 12.8 oz (127.8 kg)   Filed Weights   01/24/18 1001  Weight: 281 lb 12.8 oz (127.8 kg)   Wt Readings from Last 3 Encounters:  01/24/18 281 lb 12.8 oz (127.8 kg)  12/22/16 (!) 438 lb (198.7 kg)  09/25/15 (!) 404 lb 3.2 oz (183.3 kg)    Physical Exam: General: Well appearing. No resp difficulty. HEENT: Normal Anicteric Neck: Supple. No JVD. Carotids 2+ bilat; no bruits. No thyromegaly or nodule noted. Cor: PMI nondisplaced. RRR, No M/G/R noted Lungs: CTAB, normal effort. No wheeze Abdomen: Soft, non-tender, non-distended, no HSM. No bruits or masses. +BS  Extremities:  No cyanosis, clubbing, or rash. R and LLE trace edema.  Neuro: alert & oriented x 3, cranial nerves grossly intact. moves all 4 extremities w/o difficulty. Affect pleasant   Imaging: No results found.  EKG: Sinus brady, 53 bpm. QTC 4142. Personally reviewed.    Assessment/Plan:   1. PAF in NSR on Tikosyn - intermittent breakthroughs    --s/p previous AF ablation at [facility]     --Stable from cardiac perspective. Can switch to generic dofetilide. Will need ECG after switch (Discussed with EP).      --Continue eliquis.    -- Takes 40 mg lasix on Mon/Fri with potassium.  2. Morbid obesity    --Agree with plan for weight loss program with [facility]. - Body mass index is 38.22 kg/m. Much improved.  3. Psoriatic Arthritis  - Dr. Dierdre Forth 4. HTN     --Well controlled    --Continue spiro 12.5 daily. Creat 0.82, K 4.5 5. H/o pericarditis felt to be related to Remicade 2015 6. Hyperlipidemia    - Recheck fasting lipids. Will stop Niaspan given lack of data. Consider Vascepa   Alford Highland, NP  10:11 AM  Patient seen and examined with the above-signed Advanced Practice Provider and/or Housestaff. I personally reviewed laboratory data, imaging studies and relevant notes. I independently examined the patient and formulated the important aspects of the plan. I have edited the note to reflect any of my changes or salient points. I have personally discussed the plan with the patient and/or family.  Doing great. Has lost 150 pounds with diet and exercise. Maintaining NSR on generic dofetilide. Continue Eliquis. QT ok. No bleeding. BP well controlled. RTC in 1 year.   Arvilla Meres, MD  10:41 AM

## 2018-01-24 NOTE — Addendum Note (Signed)
Encounter addended by: Noralee Space, RN on: 01/24/2018 10:59 AM  Actions taken: Order list changed, Diagnosis association updated, Sign clinical note

## 2018-01-26 ENCOUNTER — Encounter (HOSPITAL_COMMUNITY): Payer: 59 | Admitting: Internal Medicine

## 2018-02-01 ENCOUNTER — Other Ambulatory Visit (HOSPITAL_COMMUNITY): Payer: Self-pay | Admitting: Internal Medicine

## 2018-02-07 ENCOUNTER — Ambulatory Visit (HOSPITAL_COMMUNITY)
Admission: RE | Admit: 2018-02-07 | Discharge: 2018-02-07 | Disposition: A | Discharge status: Home / Self Care | Payer: 59 | Source: Ambulatory Visit | Attending: Cardiology | Admitting: Cardiology

## 2018-02-07 DIAGNOSIS — I10 Essential (primary) hypertension: Secondary | ICD-10-CM | POA: Insufficient documentation

## 2018-02-07 DIAGNOSIS — I480 Paroxysmal atrial fibrillation: Secondary | ICD-10-CM | POA: Insufficient documentation

## 2018-02-07 LAB — LIPID PANEL
CHOL/HDL RATIO: 3 ratio
Cholesterol: 144 mg/dL (ref 0–200)
HDL: 48 mg/dL (ref >40)
LDL Cholesterol: 86 mg/dL (ref 0–99)
Triglycerides: 49 mg/dL (ref <150)
VLDL: 10 mg/dL (ref 0–40)

## 2018-02-08 ENCOUNTER — Other Ambulatory Visit (HOSPITAL_COMMUNITY): Payer: Self-pay | Admitting: Internal Medicine

## 2018-02-11 ENCOUNTER — Other Ambulatory Visit (HOSPITAL_COMMUNITY): Payer: Self-pay | Admitting: Internal Medicine

## 2018-03-08 ENCOUNTER — Other Ambulatory Visit (HOSPITAL_COMMUNITY): Payer: Self-pay | Admitting: Internal Medicine

## 2018-04-04 ENCOUNTER — Other Ambulatory Visit (HOSPITAL_COMMUNITY): Payer: Self-pay | Admitting: Internal Medicine

## 2018-05-08 ENCOUNTER — Other Ambulatory Visit (HOSPITAL_COMMUNITY): Payer: Self-pay | Admitting: Internal Medicine

## 2018-06-30 ENCOUNTER — Other Ambulatory Visit (HOSPITAL_COMMUNITY): Payer: Self-pay | Admitting: Internal Medicine

## 2018-07-30 ENCOUNTER — Other Ambulatory Visit (HOSPITAL_COMMUNITY): Payer: Self-pay | Admitting: Internal Medicine

## 2018-08-06 ENCOUNTER — Other Ambulatory Visit (HOSPITAL_COMMUNITY): Payer: Self-pay | Admitting: Internal Medicine

## 2018-09-08 ENCOUNTER — Ambulatory Visit (INDEPENDENT_AMBULATORY_CARE_PROVIDER_SITE_OTHER): Payer: Worker's Compensation

## 2018-09-08 ENCOUNTER — Ambulatory Visit (HOSPITAL_COMMUNITY)
Admission: EM | Admit: 2018-09-08 | Discharge: 2018-09-08 | Disposition: A | Discharge status: Home / Self Care | Payer: Worker's Compensation | Attending: Family Medicine | Admitting: Family Medicine

## 2018-09-08 ENCOUNTER — Encounter (HOSPITAL_COMMUNITY): Payer: Self-pay

## 2018-09-08 DIAGNOSIS — S63501A Unspecified sprain of right wrist, initial encounter: Secondary | ICD-10-CM | POA: Diagnosis not present

## 2018-09-08 DIAGNOSIS — M79641 Pain in right hand: Secondary | ICD-10-CM

## 2018-09-08 NOTE — ED Provider Notes (Signed)
[facility]    CSN: [id number] Arrival date & time: 09/08/18  1715     History   Chief Complaint Chief Complaint  Patient presents with  . Wrist Pain    HPI Mark Gonzalez. is a 58 y.o. male history of atrial flutter, on Eliquis, presenting today for evaluation of right hand injury.  Patient was walking into work earlier today slipped and fell awkwardly onto his right hand.  Since he has pain in his thumb and wrist.  Denies previous injury to this area.  Pain with moving wrist and thumb.  Denies difficulty moving other fingers.  Denies numbness or tingling.  He has not taken anything for his pain.  HPI  Past Medical History:  Diagnosis Date  . Atrial flutter (HCC)    a. s/p DCCV 08/16/2012.  . Cellulitis   . Cellulitis of leg, left 09/24/2015  . DVT (deep venous thrombosis) (HCC) 09/2014   "may have just been cellulitis"   . Hypercholesterolemia   . Hypertension   . Morbid obesity (HCC)   . NICM (nonischemic cardiomyopathy) (HCC)    a. EF 45-50% in setting of AF in past; b. Neg MV;  c. 04/2010 Echo: EF 55-60%;  d. 11/2011 Cath: nl cors.  . Paroxysmal atrial fibrillation (HCC)    a. s/p DCCV 09/2007, 11/2007, 04/2013 --> flecainide and chronic coumadin.  . Pneumonia ~ 1995  . Psoriasis    "leasions multiple places on my body; under control w/RX now" (09/25/2015)  . Psoriatic arthritis (HCC)    "primarily in my hands" (09/25/2015)    Patient Active Problem List   Diagnosis Date Noted  . AF (atrial fibrillation) (HCC) 09/25/2015  . Cellulitis of leg 09/24/2015  . Pericardial effusion 10/15/2013  . Pericarditis 10/11/2013  . Acute pericarditis, unspecified 10/01/2013  . Atrial flutter (HCC) 08/16/2012  . Wound, open, buttock with complication 08/17/2011  . HYPERLIPIDEMIA-MIXED 04/22/2010  . Morbid obesity (HCC) 05/15/2008  . Essential hypertension, benign 05/15/2008  . Atrial fibrillation (HCC) 05/15/2008    Past Surgical History:  Procedure Laterality Date    . APPENDECTOMY    . ATRIAL FIBRILLATION ABLATION  07/2011  . CARDIAC CATHETERIZATION   11/2011  . CARDIOVERSION     "I've probably had 10" (09/25/2015)  . CARDIOVERSION  08/16/2012   Procedure: CARDIOVERSION;  Surgeon: Dolores Patty, MD;  Location: [facility] ENDOSCOPY;  Service: Cardiovascular;  Laterality: N/A;  . CARDIOVERSION N/A 06/05/2013   Procedure: CARDIOVERSION;  Surgeon: Dolores Patty, MD;  Location: [facility] OR;  Service: Cardiovascular;  Laterality: N/A;  . CARDIOVERSION N/A 07/11/2014   Procedure: CARDIOVERSION;  Surgeon: Dolores Patty, MD;  Location: [facility] ENDOSCOPY;  Service: Cardiovascular;  Laterality: N/A;  . FRACTURE SURGERY    . LEFT HEART CATHETERIZATION WITH CORONARY ANGIOGRAM N/A 12/22/2011   Procedure: LEFT HEART CATHETERIZATION WITH CORONARY ANGIOGRAM;  Surgeon: Dolores Patty, MD;  Location: [facility] CATH LAB;  Service: Cardiovascular;  Laterality: N/A;  . ORIF METATARSAL FRACTURE  ~ 2001   "5th"  . TONSILLECTOMY         Home Medications    Prior to Admission medications   Medication Sig Start Date End Date Taking? Authorizing Provider  cholecalciferol (VITAMIN D) 1000 UNITS tablet Take 1,000 Units by mouth daily.     [provider]  diclofenac (VOLTAREN) 75 MG EC tablet Take 75 mg by mouth 2 (two) times daily. 09/17/15   [provider]  dofetilide (TIKOSYN) 500 MCG capsule TAKE 1 CAPSULE  BY MOUTH TWICE DAILY 07/30/18   Bensimhon, Bevelyn Bucklesaniel R, MD  ELIQUIS 5 MG TABS tablet TAKE 1 TABLET(5 MG) BY MOUTH TWICE DAILY 07/30/18   Bensimhon, Bevelyn Bucklesaniel R, MD  fish oil-omega-3 fatty acids 1000 MG capsule Take 1 g by mouth 2 (two) times daily.     [provider]  furosemide (LASIX) 20 MG tablet TAKE 1 TABLET BY MOUTH EVERY DAY 07/30/18   Bensimhon, Bevelyn Bucklesaniel R, MD  golimumab (SIMPONI ARIA) 50 MG/4ML SOLN injection Inject into the vein.    [provider]  irbesartan (AVAPRO) 75 MG tablet Take 75 mg by mouth daily.    [provider]   lovastatin (MEVACOR) 20 MG tablet TAKE 1 TABLET(20 MG) BY MOUTH AT BEDTIME 04/04/18   Bensimhon, Bevelyn Bucklesaniel R, MD  Magnesium 400 MG CAPS Take 400 mg by mouth daily.    [provider]  Multiple Vitamin (MULTIVITAMIN WITH MINERALS) TABS tablet Take 1 tablet by mouth daily.    [provider]  NIASPAN 500 MG CR tablet take 1 tablet by mouth at bedtime as directed and take 1 ASPIRIN 1/2 HOUR PRIOR TO NIASPAN 01/10/17   Bensimhon, Bevelyn Bucklesaniel R, MD  potassium chloride SA (K-DUR,KLOR-CON) 20 MEQ tablet TAKE 2 TABLETS BY MOUTH TWO TIMES A WEEK ON MONDAY AND THURSDAY NIGHTS 08/07/18   Bensimhon, Bevelyn Bucklesaniel R, MD  Probiotic Product (PROBIOTIC PO) Take 1 tablet by mouth daily.    [provider]  spironolactone (ALDACTONE) 25 MG tablet TAKE 1/2 TABLET(12.5 MG) BY MOUTH DAILY 02/02/18   Bensimhon, Bevelyn Bucklesaniel R, MD    Family History Family History  Problem Relation Age of Onset  . Heart disease Neg Hx     Social History Social History   Tobacco Use  . Smoking status: Former Smoker    Packs/day: 1.00    Years: 8.00    Pack years: 8.00    Types: Cigarettes    Last attempt to quit: 08/01/1990    Years since quitting: 28.1  . Smokeless tobacco: Never Used  Substance Use Topics  . Alcohol use: Yes    Alcohol/week: 2.0 standard drinks    Types: 2 Cans of beer per week  . Drug use: No     Allergies   Lisinopril   Review of Systems Review of Systems  Constitutional: Negative for fatigue and fever.  Eyes: Negative for redness, itching and visual disturbance.  Respiratory: Negative for shortness of breath.   Cardiovascular: Negative for chest pain and leg swelling.  Gastrointestinal: Negative for nausea and vomiting.  Musculoskeletal: Positive for arthralgias and joint swelling. Negative for myalgias.  Skin: Positive for color change. Negative for rash and wound.  Neurological: Negative for dizziness, syncope, weakness, light-headedness and headaches.     Physical Exam Triage Vital  Signs ED Triage Vitals [09/08/18 1826]  Enc Vitals Group     BP (!) 144/88     Pulse Rate 60     Resp 18     Temp 98 F (36.7 C)     Temp Source Oral     SpO2 100 %     Weight      Height      Head Circumference      Peak Flow      Pain Score 6     Pain Loc      Pain Edu?      Excl. in GC?    No data found.  Updated Vital Signs BP (!) 144/88 (BP Location: Right Arm)  Pulse 60   Temp 98 F (36.7 C) (Oral)   Resp 18   SpO2 100%   Visual Acuity Right Eye Distance:   Left Eye Distance:   Bilateral Distance:    Right Eye Near:   Left Eye Near:    Bilateral Near:     Physical Exam Vitals signs and nursing note reviewed.  Constitutional:      Appearance: He is well-developed.     Comments: No acute distress  HENT:     Head: Normocephalic and atraumatic.     Nose: Nose normal.  Eyes:     Conjunctiva/sclera: Conjunctivae normal.  Neck:     Musculoskeletal: Neck supple.  Cardiovascular:     Rate and Rhythm: Normal rate.  Pulmonary:     Effort: Pulmonary effort is normal. No respiratory distress.  Abdominal:     General: There is no distension.  Musculoskeletal: Normal range of motion.     Comments: Swelling and mild bruising to first metacarpal, mild snuffbox tenderness as well as tenderness on second metacarpal side of first metacarpal, minimal distal radius and ulna tenderness, radial pulse 2+  Relatively full active range of motion of thumb  Skin:    General: Skin is warm and dry.  Neurological:     Mental Status: He is alert and oriented to person, place, and time.      UC Treatments / Results  Labs (all labs ordered are listed, but only abnormal results are displayed) Labs Reviewed - No data to display  EKG None  Radiology Dg Hand Complete Right  Result Date: 09/08/2018 CLINICAL DATA:  58 year old male with history of slip and fall this morning complaining of pain in the left hand and swelling along the first MCP joint. EXAM: RIGHT HAND -  COMPLETE 3+ VIEW COMPARISON:  None. FINDINGS: There is no evidence of fracture or dislocation. Multifocal joint space narrowing, subchondral sclerosis and osteophytes, compatible with osteoarthritis. Soft tissues are unremarkable. IMPRESSION: 1. No acute radiographic abnormality of the right hand. 2. Degenerative changes of osteoarthritis. Electronically Signed   By: Trudie Reed M.D.   On: 09/08/2018 19:26    Procedures Procedures (including critical care time)  Medications Ordered in UC Medications - No data to display  Initial Impression / Assessment and Plan / UC Course  I have reviewed the triage vital signs and the nursing notes.  Pertinent labs & imaging results that were available during my care of the patient were reviewed by me and considered in my medical decision making (see chart for details).     X-ray negative, patient does have some mild snuffbox tenderness, will place in thumb spica for the next 2 weeks and have follow-up if symptoms not resolving.  Discussed this with patient with concern for scaphoid.  He verbalized understanding need to follow-up.  In the interim we will treat also with ice and Tylenol, avoiding NSAIDs due to Eliquis use.  Neurovascularly intact.  Continue to monitor,Discussed strict return precautions. Patient verbalized understanding and is agreeable with plan.  Final Clinical Impressions(s) / UC Diagnoses   Final diagnoses:  Sprain of right wrist, initial encounter     Discharge Instructions     Tylenol for pain and swelling Ice wrist/hand Wear wrist brace for next 2 weeks Follow up in 2 weeks here or with ortho if not having improvement in pain   ED Prescriptions    None     Controlled Substance Prescriptions  Controlled Substance Registry consulted? Not Applicable   Sharyon Cable,  Hallie C, PA-C 09/08/18 1953

## 2018-09-08 NOTE — ED Triage Notes (Signed)
Pt presents with right wrist & thumb pain, swelling and bruising after a fall earlier today.

## 2018-09-08 NOTE — Discharge Instructions (Signed)
Tylenol for pain and swelling Ice wrist/hand Wear wrist brace for next 2 weeks Follow up in 2 weeks here or with ortho if not having improvement in pain

## 2018-12-18 DIAGNOSIS — L4050 Arthropathic psoriasis, unspecified: Secondary | ICD-10-CM | POA: Diagnosis not present

## 2019-01-29 ENCOUNTER — Other Ambulatory Visit (HOSPITAL_COMMUNITY): Payer: Self-pay | Admitting: *Deleted

## 2019-01-29 MED ORDER — POTASSIUM CHLORIDE CRYS ER 20 MEQ PO TBCR: 40.0000 meq | EXTENDED_RELEASE_TABLET | ORAL | 6 refills | Status: DC

## 2019-02-14 ENCOUNTER — Ambulatory Visit: Payer: BC Managed Care – PPO | Admitting: Sports Medicine

## 2019-02-14 ENCOUNTER — Encounter: Payer: Self-pay | Admitting: Sports Medicine

## 2019-02-14 ENCOUNTER — Ambulatory Visit
Admission: RE | Admit: 2019-02-14 | Discharge: 2019-02-14 | Disposition: A | Discharge status: Home / Self Care | Payer: BC Managed Care – PPO | Source: Ambulatory Visit | Attending: Sports Medicine | Admitting: Sports Medicine

## 2019-02-14 ENCOUNTER — Other Ambulatory Visit: Payer: Self-pay

## 2019-02-14 VITALS — BP 144/80 | Ht 72.0 in | Wt 310.0 lb

## 2019-02-14 DIAGNOSIS — M545 Low back pain, unspecified: Secondary | ICD-10-CM

## 2019-02-14 DIAGNOSIS — M47816 Spondylosis without myelopathy or radiculopathy, lumbar region: Secondary | ICD-10-CM | POA: Diagnosis not present

## 2019-02-14 DIAGNOSIS — M4317 Spondylolisthesis, lumbosacral region: Secondary | ICD-10-CM | POA: Diagnosis not present

## 2019-02-14 DIAGNOSIS — M5441 Lumbago with sciatica, right side: Secondary | ICD-10-CM

## 2019-02-14 DIAGNOSIS — M1611 Unilateral primary osteoarthritis, right hip: Secondary | ICD-10-CM | POA: Diagnosis not present

## 2019-02-14 NOTE — Progress Notes (Signed)
PCP: Darrow Bussing, MD  Subjective:   HPI: Patient is a 58 y.o. male here for back and right leg pain x 6 months ago. He has chronic intermittent low back pain for >18 years. He notes this pain in his back and leg began after a bike ride. He was on a bike ride when he developed some low right back pain and some numbness/tingling down his right leg. This has been slowly getting worse over the last several months. He denies any current back pain but does continue to have numbness and tingling. He also notes when he rotates his right leg to cross his legs he feels he has decreased ROM and tightness. He also notes reproducable back pain and numbness/tingling with prolonged standing and walking. He has not been able to ride his bike since January due to the pain in his leg and back.   Of note, he had MRI of lumbar spine in 2012 notable for severe multifactorial spinal and lateral recess stenosis at L3-L5 and broad-based central disc protrusion at L5-S1 which contact both S1 nerve roots. He has history of receiving epidural injections >10 years ago for his back pain.   Patient is a bike rider and has lost 120 pounds since 2018  Does have history of nondisplaced fracture of the patella in Jan 2019.       BP (!) 144/80   Ht 6' (1.829 m)   Wt (!) 310 lb (140.6 kg)   BMI 42.04 kg/m   Review of Systems: See HPI above.     Objective:  Physical Exam:  Gen: awake, alert, NAD, comfortable in exam room Pulm: breathing unlabored  Lumbar spine: - Inspection: no gross deformity or asymmetry, swelling or ecchymosis - Palpation: No TTP over the spinous processes, paraspinal muscles. Mild pain along SI joints bilaterally  - ROM: full active ROM of the lumbar spine in flexion and extension without pain - Strength: 5/5 strength of lower extremity in L4-S1 nerve root distributions b/l; normal gait - Neuro: decreased sensation to light tough along lateral LE along L5 distripution compared to left,  2+ L4 reflex on left, 0+ L4 reflex on right, trace but equal S1 reflex bilaterally - Special testing: Positive straight leg test, Negative FABER  Hip:  - Inspection: No gross deformity, no swelling, erythema, or ecchymosis - Palpation: No TTP, specifically none over greater trochanter - ROM: Decreased abduction, internal and external rotation of right hip (25% internal rotation on right, 45% on left), Normal range of motion on Flexion, extension - Strength: Normal strength. - Neuro/vasc: NV intact distally - Special Tests: Negative FABER and FADIR.  Positive Trendelenberg.      Assessment & Plan:  1. Right LE Sciatica pain: Patient has reproducible RLE sciatica pain. Based on decreased L4 reflex and sensation along L5 distribution, expect this may be stemming from known severe spinal and lateral recess stenosis at L3-L5 with possible agitation of sciatic nerve roots originating from spine. He appears to also have gluteus medius and minimus instability. Given that these symptoms have continued for 6 months without improvement with rest from agravating factors, there is some concern for worsening stenosis.  - obtain lumbar spine x-ray and repeat lumbar spine MRI for further evaluation  - consider another epidural steroid injection if imaging indicated pathology originating from back - follow up following above imaging to review   2. Right hip pain: Some decreased internal and external ROM of right hip with pain. Patient has history of psoriatic arthritis.  Expect arthritic pain contributing to symptoms however will obtain hip x-ray for further evaluation.    Mina Marble, DO [facility], PGY2 02/14/2019 12:48 PM  Patient seen and evaluated with the resident.  I agree with the above plan of care.  X-rays of the right hip show no significant degenerative changes.  X-rays of the lumbar spine show significant degenerative disc disease at L4-L5 and L5-S1.  An MRI done in 2012 showed  severe multifactorial spinal and lateral stenosis at L3-L4 and L4-L5 as well as a central disc protrusion at L5-S1 which seemed to contact both S1 nerve roots.  Patient's symptoms today seem consistent with L5 nerve root radiculopathy given his findings on physical exam.  Symptoms have been present now for 6 months after an acute event while riding his bike.  He continues to have symptoms despite watchful waiting and formal physical therapy.  We need to get updated imaging of his lumbar spine, specifically an MRI, to reevaluate the degree of spinal stenosis present.  We could consider epidural steroid injections if appropriate as he has had good response to those in the past.  We will call him with the results of the MRI when available we will delineate further treatment based on those findings.

## 2019-02-18 DIAGNOSIS — Z79899 Other long term (current) drug therapy: Secondary | ICD-10-CM | POA: Diagnosis not present

## 2019-02-18 DIAGNOSIS — L4050 Arthropathic psoriasis, unspecified: Secondary | ICD-10-CM | POA: Diagnosis not present

## 2019-02-22 DIAGNOSIS — Z20828 Contact with and (suspected) exposure to other viral communicable diseases: Secondary | ICD-10-CM | POA: Diagnosis not present

## 2019-02-25 ENCOUNTER — Other Ambulatory Visit (HOSPITAL_COMMUNITY): Payer: Self-pay | Admitting: Internal Medicine

## 2019-03-12 ENCOUNTER — Other Ambulatory Visit: Payer: Self-pay

## 2019-03-12 ENCOUNTER — Ambulatory Visit
Admission: RE | Admit: 2019-03-12 | Discharge: 2019-03-12 | Disposition: A | Discharge status: Home / Self Care | Payer: BC Managed Care – PPO | Source: Ambulatory Visit | Attending: Sports Medicine | Admitting: Sports Medicine

## 2019-03-12 DIAGNOSIS — M5441 Lumbago with sciatica, right side: Secondary | ICD-10-CM

## 2019-03-13 ENCOUNTER — Other Ambulatory Visit: Payer: Self-pay | Admitting: Student

## 2019-03-13 ENCOUNTER — Other Ambulatory Visit: Payer: Self-pay | Admitting: Sports Medicine

## 2019-03-13 ENCOUNTER — Telehealth: Payer: Self-pay | Admitting: Sports Medicine

## 2019-03-13 DIAGNOSIS — M5441 Lumbago with sciatica, right side: Secondary | ICD-10-CM

## 2019-03-13 NOTE — Addendum Note (Signed)
Addended by: Cyd Silence on: 03/13/2019 03:46 PM   Modules accepted: Orders

## 2019-03-13 NOTE — Telephone Encounter (Signed)
  I spoke with Mark Gonzalez today on the phone after reviewing the updated MRI of his lumbar spine.  He still has severe multifactorial spinal stenosis at both L3-L4 and L4-L5.  It is mildly progressive from his previous MRI.  Nothing acute is seen.  He has had great success with epidural steroid injections in the past.  Therefore, I will refer him to [facility] imaging for a repeat series of lumbar ESI's.  He will need to temporarily discontinue his Eliquis before his injections.  This is something he will need to discuss with Dr. Haroldine Laws.  Mark Gonzalez does endorse some intermittent weakness in the left leg so I want to reevaluate him here in the office after his series of injections is complete.  He is agreeable with this plan.

## 2019-03-14 ENCOUNTER — Telehealth (HOSPITAL_COMMUNITY): Payer: Self-pay | Admitting: *Deleted

## 2019-03-14 NOTE — Telephone Encounter (Signed)
Received request from [place], pt needs Lumbar ESI and needs to hold Eliquis 2 days prior.    Per Dr Haroldine Laws authorization is granted  Form faxed to them at [phone]

## 2019-03-20 ENCOUNTER — Other Ambulatory Visit: Payer: Self-pay

## 2019-03-20 ENCOUNTER — Ambulatory Visit
Admission: RE | Admit: 2019-03-20 | Discharge: 2019-03-20 | Disposition: A | Discharge status: Home / Self Care | Payer: BC Managed Care – PPO | Source: Ambulatory Visit | Attending: Sports Medicine | Admitting: Sports Medicine

## 2019-03-20 DIAGNOSIS — M5441 Lumbago with sciatica, right side: Secondary | ICD-10-CM

## 2019-03-20 DIAGNOSIS — M48061 Spinal stenosis, lumbar region without neurogenic claudication: Secondary | ICD-10-CM | POA: Diagnosis not present

## 2019-03-20 MED ORDER — IOPAMIDOL (ISOVUE-M 200) INJECTION 41%: 1.0000 mL | Freq: Once | INTRAMUSCULAR | Status: DC

## 2019-03-20 MED ORDER — METHYLPREDNISOLONE ACETATE 40 MG/ML INJ SUSP (RADIOLOG: 120.0000 mg | Freq: Once | INTRAMUSCULAR | Status: DC

## 2019-03-20 NOTE — Discharge Instructions (Signed)

## 2019-04-02 ENCOUNTER — Telehealth: Payer: Self-pay | Admitting: *Deleted

## 2019-04-02 DIAGNOSIS — M5441 Lumbago with sciatica, right side: Secondary | ICD-10-CM

## 2019-04-02 NOTE — Telephone Encounter (Signed)
Order placed

## 2019-04-03 ENCOUNTER — Other Ambulatory Visit: Payer: Self-pay | Admitting: Sports Medicine

## 2019-04-03 DIAGNOSIS — G8929 Other chronic pain: Secondary | ICD-10-CM

## 2019-04-11 ENCOUNTER — Other Ambulatory Visit: Payer: Self-pay

## 2019-04-11 ENCOUNTER — Ambulatory Visit
Admission: RE | Admit: 2019-04-11 | Discharge: 2019-04-11 | Disposition: A | Discharge status: Home / Self Care | Payer: BC Managed Care – PPO | Source: Ambulatory Visit | Attending: Sports Medicine | Admitting: Sports Medicine

## 2019-04-11 DIAGNOSIS — G8929 Other chronic pain: Secondary | ICD-10-CM

## 2019-04-11 DIAGNOSIS — M48061 Spinal stenosis, lumbar region without neurogenic claudication: Secondary | ICD-10-CM | POA: Diagnosis not present

## 2019-04-11 MED ORDER — METHYLPREDNISOLONE ACETATE 40 MG/ML INJ SUSP (RADIOLOG
120.0000 mg | Freq: Once | INTRAMUSCULAR | Status: AC
  Administered 2019-04-11: 120 mg via EPIDURAL

## 2019-04-11 MED ORDER — IOPAMIDOL (ISOVUE-M 200) INJECTION 41%
1.0000 mL | Freq: Once | INTRAMUSCULAR | Status: AC
  Administered 2019-04-11: 1 mL via EPIDURAL

## 2019-04-11 NOTE — Discharge Instructions (Signed)

## 2019-04-16 DIAGNOSIS — L4050 Arthropathic psoriasis, unspecified: Secondary | ICD-10-CM | POA: Diagnosis not present

## 2019-05-07 DIAGNOSIS — I32 Pericarditis in diseases classified elsewhere: Secondary | ICD-10-CM | POA: Diagnosis not present

## 2019-05-07 DIAGNOSIS — L03115 Cellulitis of right lower limb: Secondary | ICD-10-CM | POA: Diagnosis not present

## 2019-05-07 DIAGNOSIS — L4050 Arthropathic psoriasis, unspecified: Secondary | ICD-10-CM | POA: Diagnosis not present

## 2019-05-07 DIAGNOSIS — L401 Generalized pustular psoriasis: Secondary | ICD-10-CM | POA: Diagnosis not present

## 2019-05-28 ENCOUNTER — Telehealth: Payer: BC Managed Care – PPO | Admitting: Physician Assistant

## 2019-05-28 DIAGNOSIS — J019 Acute sinusitis, unspecified: Secondary | ICD-10-CM

## 2019-05-28 MED ORDER — AMOXICILLIN-POT CLAVULANATE 875-125 MG PO TABS: 1.0000 | ORAL_TABLET | Freq: Two times a day (BID) | ORAL | 0 refills | Status: DC

## 2019-05-28 MED ORDER — FLUTICASONE PROPIONATE 50 MCG/ACT NA SUSP: 2.0000 | Freq: Every day | NASAL | 0 refills | Status: DC

## 2019-05-28 NOTE — Progress Notes (Signed)
We are sorry that you are not feeling well.  Here is how we plan to help!  Based on what you have shared with me it looks like you have sinusitis.  Sinusitis is inflammation and infection in the sinus cavities of the head.  Based on your presentation I believe you most likely have Acute Bacterial Sinusitis.  This is an infection caused by bacteria and is treated with antibiotics. I have prescribed Augmentin 875mg /125mg  one tablet twice daily with food, for 7 days. I have also prescribed Fluticasone nasal spray which should help with your sinus congestion and will also help with the fullness you are feeling in your ears. You may use an oral decongestant such as Mucinex D or if you have glaucoma or high blood pressure use plain Mucinex. Saline nasal spray can also help and can safely be used as often as needed for congestion.  If you develop worsening sinus pain, fever or notice severe headache and vision changes, or if symptoms are not better after completion of antibiotic, please schedule an appointment with a health care provider.    Sinus infections are not as easily transmitted as other respiratory infection, however we still recommend that you avoid close contact with loved ones, especially the very young and elderly.  Remember to wash your hands thoroughly throughout the day as this is the number one way to prevent the spread of infection!  Home Care:  Only take medications as instructed by your medical team.  Complete the entire course of an antibiotic.  Do not take these medications with alcohol.  A steam or ultrasonic humidifier can help congestion.  You can place a towel over your head and breathe in the steam from hot water coming from a faucet.  Avoid close contacts especially the very young and the elderly.  Cover your mouth when you cough or sneeze.  Always remember to wash your hands.  Get Help Right Away If:  You develop worsening fever or sinus pain.  You develop a severe  head ache or visual changes.  Your symptoms persist after you have completed your treatment plan.  Make sure you  Understand these instructions.  Will watch your condition.  Will get help right away if you are not doing well or get worse.  Your e-visit answers were reviewed by a board certified advanced clinical practitioner to complete your personal care plan.  Depending on the condition, your plan could have included both over the counter or prescription medications.  If there is a problem please reply  once you have received a response from your provider.  Your safety is important to [place].  If you have drug allergies check your prescription carefully.    You can use MyChart to ask questions about today's visit, request a non-urgent call back, or ask for a work or school excuse for 24 hours related to this e-Visit. If it has been greater than 24 hours you will need to follow up with your provider, or enter a new e-Visit to address those concerns.  You will get an e-mail in the next two days asking about your experience.  I hope that your e-visit has been valuable and will speed your recovery. Thank you for using e-visits.  Approximately 5 minutes was spent documenting and reviewing patient's chart.

## 2019-06-11 DIAGNOSIS — L4050 Arthropathic psoriasis, unspecified: Secondary | ICD-10-CM | POA: Diagnosis not present

## 2019-07-09 ENCOUNTER — Ambulatory Visit: Payer: BC Managed Care – PPO | Admitting: Sports Medicine

## 2019-07-09 ENCOUNTER — Other Ambulatory Visit: Payer: Self-pay

## 2019-07-09 DIAGNOSIS — M48062 Spinal stenosis, lumbar region with neurogenic claudication: Secondary | ICD-10-CM | POA: Diagnosis not present

## 2019-07-09 DIAGNOSIS — M48061 Spinal stenosis, lumbar region without neurogenic claudication: Secondary | ICD-10-CM | POA: Insufficient documentation

## 2019-07-09 NOTE — Assessment & Plan Note (Signed)
History, physical, MRI imaging consistent with lumbar spinal stenosis with symptoms of neurogenic claudication.  At this point, he has undergone conservative treatment with physical therapy and steroid injections for roughly the past 18 years.  His symptoms have slowly progressed and he now obtains minimal relief with epidural spinal injections.  An adequate attempt at conservative treatment has failed.  Will refer to neurosurgery today for discussion about the risks and benefits of surgery for lumbar stenosis.

## 2019-07-09 NOTE — Progress Notes (Signed)
Mark Gonzalez. is a 58 y.o. male who presents to [facility] today for the following: Follow-up for back pain.  [name] has a long history (about 18 years) of back pain related to lumbar spinal stenosis in addition to foraminal narrowing and disc herniation.  He is coming in today to see what further therapies can be helpful for his back pain and to discuss surgical options.  His most distressing symptoms related to his back pain are his daily chronic, unremitting dull ache in his lower back and the back of his thighs.  He reports that he is unable to lie flat without back pain he is also unable to walk or stand longer than 10 minutes due to his increasing achy back and leg pain which progresses to pins and needles sensation.  At the end of 10 minutes of walking, he notices some gait instability and weakness of his lower extremities.  He is able to demonstrated standing in the office during our conversation and over the course of 5 minutes he reports his pain slowly increased and he began to experience pins-and-needles.  This past year has been difficult for him due to loss of his mother and the coronavirus.  He has had a 50 pound weight gain in the past year and he is aware that this is likely contributing to his back pain.  He is not currently take any NSAIDs due to cardiovascular disease and chronic anticoagulation.  He has tried physical therapy in the past, about ago, without any significant benefit.  Is also received multiple rounds of epidural spinal injections.  Initially, these injections were helpful.  Most recently, 3 and 4 months ago, his spinal injections were less effective and did not bleed to significant relief compared to his previous injections.  He denies shooting pain numbness or tingling of the groin fecal and urinary incontinence.  PMH reviewed.  Lumbar spinal stenosis, lumbar foraminal narrowing, lumbar disc herniation ROS as above. Medications reviewed.  Exam:  BP (!)  162/95   Ht 6' (1.829 m)   Wt (!) 325 lb (147.4 kg)   BMI 44.08 kg/m  Gen: Well NAD.  Obese.  Seated comfortably on the exam table.  During our conversation, he demonstrated how standing for a period of 10 minutes would lead to increasing achy back pain and numbness and tingling. MSK: Lumbar spine: - Inspection: no gross deformity or asymmetry, swelling or ecchymosis - Palpation: No TTP over the spinous processes, paraspinal muscles, or SI joints b/l - Strength: 5/5 strength of hip flexion, knee flexion, knee extension. - Neuro: Sensation intact to soft touch bilaterally lower extremities.  Negative straight leg raise.   No results found.   Assessment and Plan: 1) Lumbar spinal stenosis History, physical, MRI imaging consistent with lumbar spinal stenosis with symptoms of neurogenic claudication.  At this point, he has undergone conservative treatment with physical therapy and steroid injections for roughly the past 18 years.  His symptoms have slowly progressed and he now obtains minimal relief with epidural spinal injections.  An adequate attempt at conservative treatment has failed.  Will refer to neurosurgery today for discussion about the risks and benefits of surgery for lumbar stenosis.  Patient seen and evaluated with the resident.  I agree with the above plan of care.  Patient has had symptoms for many years.  Although he initially responded to conservative treatment, this is quickly failing.  To recent lumbar epidural steroid injections provided him with some symptom relief but  were not as successful as in the past.  I recommended a referral to Dr. Kristeen Gonzalez to discuss risks and benefits of spinal decompression and possible fusion.  Patient is in agreement with this plan.  I will defer further work-up and treatment to the discretion of Dr. Ellene Gonzalez and the patient will follow up with me as needed.

## 2019-07-10 ENCOUNTER — Other Ambulatory Visit: Payer: Self-pay

## 2019-07-10 DIAGNOSIS — M48062 Spinal stenosis, lumbar region with neurogenic claudication: Secondary | ICD-10-CM

## 2019-07-15 DIAGNOSIS — Z119 Encounter for screening for infectious and parasitic diseases, unspecified: Secondary | ICD-10-CM | POA: Diagnosis not present

## 2019-08-08 DIAGNOSIS — L4050 Arthropathic psoriasis, unspecified: Secondary | ICD-10-CM | POA: Diagnosis not present

## 2019-08-09 DIAGNOSIS — M48062 Spinal stenosis, lumbar region with neurogenic claudication: Secondary | ICD-10-CM | POA: Diagnosis not present

## 2019-08-12 ENCOUNTER — Ambulatory Visit: Payer: BC Managed Care – PPO | Attending: Internal Medicine

## 2019-08-12 DIAGNOSIS — Z20822 Contact with and (suspected) exposure to covid-19: Secondary | ICD-10-CM | POA: Diagnosis not present

## 2019-08-13 ENCOUNTER — Telehealth: Payer: Self-pay

## 2019-08-13 LAB — NOVEL CORONAVIRUS, NAA: SARS-CoV-2, NAA: NOT DETECTED

## 2019-08-13 NOTE — Telephone Encounter (Signed)
Patient with diagnosis of afib on Eliquis for anticoagulation.    Procedure: L/3-4/L4-5 ANTEROLATERAL LUMBAR INTERBODY FUSION W/PERCUTANEOUD PEDICLE SCREW FIXATION Date of procedure: TBD  CHADS2-VASc score of  1 (HTN)  CrCl >100 ml/min  Per office protocol, patient can hold Eliquis for 3 days prior to procedure.

## 2019-08-13 NOTE — Telephone Encounter (Signed)
   [facility] Pre-operative Risk Assessment    Request for surgical clearance:  1. What type of surgery is being performed? L/3-4/L4-5 ANTEROLATERAL LUMBAR INTERBODY FUSION W/PERCUTANEOUD PEDICLE SCREW FIXATION  2. When is this surgery scheduled? TBD   3. What type of clearance is required (medical clearance vs. Pharmacy clearance to hold med vs. Both)? BOTH  4. Are there any medications that need to be held prior to surgery and how long? ELIQUIS   5. Practice name and name of physician performing surgery? [facility] ATTN:JESSICA  6. What is your office phone number  [phone]   7.   What is your office fax number [phone]  8.   Anesthesia type (None, local, MAC, general) ? general

## 2019-08-13 NOTE — Telephone Encounter (Signed)
   Primary Cardiologist:Daniel Bensimhon, MD  Chart reviewed as part of pre-operative protocol coverage. Patient has not been seen in our office since 12/2017. Because of Mark Gonzalez past medical history and time since last visit, he/she will require a follow-up visit in order to better assess preoperative cardiovascular risk.  Pre-op covering staff: - Please schedule appointment and call patient to inform them. - Please contact requesting surgeon's office via preferred method (i.e, phone, fax) to inform them of need for appointment prior to surgery.  Will also route this message to pharmacy pool for their input on holding Eliquis   so that this information is available at time of patient's appointment.   Corrin Parker, PA-C  08/13/2019, 8:35 AM

## 2019-08-14 NOTE — Telephone Encounter (Signed)
Hey! Can you please help schedule this patient an appointment? He hasn't been seen since 12/2017.   I will then remove from pre-op pool.   Thank you!

## 2019-08-15 NOTE — Telephone Encounter (Signed)
Spoke w/pt, appt sch for Mon 1/18 w/Dr Bensimhon, COVID scrren completed  COVID-19 pre-appointment screening questions:   Do you have a history of COVID-19 or a positive test result in the past 7-10 days? NO  To the best of your knowledge, have you been in close contact with anyone with a confirmed diagnosis of COVID 19? NO  Have you had any one or more of the following: Fever, chills, cough, shortness of breath (out of the normal for you) or any flu-like symptoms? NO  Are you experiencing any of the following symptoms that is new or out of usual for you: NO  . Ear, nose or throat discomfort . Sore throat . Headache . Muscle Pain . Diarrhea . Loss of taste or smell   Reviewed all the following with patient: . Use of hand sanitizer when entering the building . Everyone is required to wear a mask in the building, if you do not have a mask we are happy to provide you with one when you arrive . NO Visitor guidelines   If patient answers YES to any of questions they must change to a virtual visit and place note in comments about symptoms

## 2019-08-19 ENCOUNTER — Ambulatory Visit (HOSPITAL_COMMUNITY)
Admission: RE | Admit: 2019-08-19 | Discharge: 2019-08-19 | Disposition: A | Discharge status: Home / Self Care | Payer: BC Managed Care – PPO | Source: Ambulatory Visit | Attending: Internal Medicine | Admitting: Internal Medicine

## 2019-08-19 ENCOUNTER — Encounter (HOSPITAL_COMMUNITY): Payer: Self-pay | Admitting: Internal Medicine

## 2019-08-19 ENCOUNTER — Other Ambulatory Visit: Payer: Self-pay

## 2019-08-19 VITALS — BP 157/84 | HR 58 | Wt 349.2 lb

## 2019-08-19 DIAGNOSIS — E785 Hyperlipidemia, unspecified: Secondary | ICD-10-CM | POA: Insufficient documentation

## 2019-08-19 DIAGNOSIS — I10 Essential (primary) hypertension: Secondary | ICD-10-CM | POA: Insufficient documentation

## 2019-08-19 DIAGNOSIS — I480 Paroxysmal atrial fibrillation: Secondary | ICD-10-CM | POA: Insufficient documentation

## 2019-08-19 DIAGNOSIS — L4050 Arthropathic psoriasis, unspecified: Secondary | ICD-10-CM | POA: Diagnosis not present

## 2019-08-19 DIAGNOSIS — Z6841 Body Mass Index (BMI) 40.0 and over, adult: Secondary | ICD-10-CM | POA: Diagnosis not present

## 2019-08-19 DIAGNOSIS — Z79899 Other long term (current) drug therapy: Secondary | ICD-10-CM | POA: Insufficient documentation

## 2019-08-19 DIAGNOSIS — Z7901 Long term (current) use of anticoagulants: Secondary | ICD-10-CM | POA: Insufficient documentation

## 2019-08-19 DIAGNOSIS — I4891 Unspecified atrial fibrillation: Secondary | ICD-10-CM | POA: Diagnosis not present

## 2019-08-19 DIAGNOSIS — Z01810 Encounter for preprocedural cardiovascular examination: Secondary | ICD-10-CM

## 2019-08-19 DIAGNOSIS — Z801 Family history of malignant neoplasm of trachea, bronchus and lung: Secondary | ICD-10-CM | POA: Diagnosis not present

## 2019-08-19 MED ORDER — IRBESARTAN 150 MG PO TABS: 150.0000 mg | ORAL_TABLET | Freq: Every day | ORAL | 11 refills | Status: DC

## 2019-08-19 NOTE — Progress Notes (Signed)
Patient ID: Mark Born., male   DOB: 1960-11-16, 59 y.o.   MRN: [id number] . Advanced Heart Failure Clinic Note   Subjective:    Mark Gonzalez is a very pleasant 59 y/o male with a history of psoriatic arthritis, obesity, hypertension, and atrial fibrillation. In 2009 he was admitted for Tikosyn load followed by successful DC-CV. He continued on 500 mcg Tikosyn bid but later went back into A fib. He was referred to [facility] for an ablation. He had AF ablation at [facility] and was maintained in NSR on flecainide but went back into A fib.  Eventually failed Flecainide with multiple recurrences of AF. We re-started Tikosyn in 06/2013. He had an early recurrence of AF in about 2 weeks. Had 4 day episode of AF around Xmas but then went back into NSR and has held for the most part  Admitted several times in March 2015 with acute pericarditis. Treated with colchicine and steroids. Had recurrence and readmitted. Echo with moderate pericardial effusion. Started on prednisone 60 daily. Effusion resolved after 1 week. Remicade stopped as it was thought that it might be causative.   Here for regular follow up. Last seen 11/2016. He has lost substantial weight (~150 pounds) using [facility] MD.has gained about 75 pounds back over the past 6 months. Remains on tikosyn. Follows his HR on [employer] and [employer]. Had one episode of AF in July that lasted 3 hours. No other events. Has had a rough year. Lost his Mom due to lung CA (Dad died about 2 years ago). Very few gigs due to [place]. No longer doing [employer].  Pending surgery for spinal stenosis. No bleeding on Eliquis. Not exercising too much now given back pain and other issues. (was doing 150-200 miles/week on his bike in January 2020).    Current Outpatient Medications on File Prior to Encounter  Medication Sig Dispense Refill  . b complex vitamins tablet Take 1 tablet by mouth daily.    . cholecalciferol (VITAMIN D) 1000 UNITS tablet Take 1,000 Units by mouth  daily.     . diclofenac (VOLTAREN) 75 MG EC tablet Take 75 mg by mouth 2 (two) times daily.  0  . dofetilide (TIKOSYN) 500 MCG capsule TAKE 1 CAPSULE BY MOUTH TWICE DAILY 60 capsule 5  . ELIQUIS 5 MG TABS tablet TAKE 1 TABLET(5 MG) BY MOUTH TWICE DAILY 60 tablet 5  . fish oil-omega-3 fatty acids 1000 MG capsule Take 1 g by mouth 2 (two) times daily.     . fluticasone (FLONASE) 50 MCG/ACT nasal spray Place 2 sprays into both nostrils daily. 16 g 0  . furosemide (LASIX) 20 MG tablet Take 20 mg by mouth 2 (two) times a week. Every Monday and Thursday    . golimumab (SIMPONI ARIA) 50 MG/4ML SOLN injection Inject into the vein.    Marland Kitchen irbesartan (AVAPRO) 75 MG tablet Take 75 mg by mouth daily.    Marland Kitchen lovastatin (MEVACOR) 20 MG tablet TAKE 1 TABLET(20 MG) BY MOUTH AT BEDTIME 30 tablet 5  . Magnesium 400 MG CAPS Take 400 mg by mouth daily.    . Multiple Vitamin (MULTIVITAMIN WITH MINERALS) TABS tablet Take 1 tablet by mouth daily.    . potassium chloride SA (K-DUR) 20 MEQ tablet Take 2 tablets (40 mEq total) by mouth 2 (two) times a week. Every Monday and Thursday 20 tablet 6  . Probiotic Product (PROBIOTIC PO) Take 1 tablet by mouth daily.     No current facility-administered  medications on file prior to encounter.    Objective:       Weight change: Vitals:   08/19/19 1046  BP: (!) 157/84  Pulse: (!) 58  SpO2: 99%  Weight: (!) 158.4 kg (349 lb 3.2 oz)   Filed Weights   08/19/19 1046  Weight: (!) 158.4 kg (349 lb 3.2 oz)   Wt Readings from Last 3 Encounters:  08/19/19 (!) 158.4 kg (349 lb 3.2 oz)  07/09/19 (!) 147.4 kg (325 lb)  02/14/19 (!) 140.6 kg (310 lb)    Physical Exam: General:  Well appearing. No resp difficulty HEENT: normal Neck: supple. no JVD. Carotids 2+ bilat; no bruits. No lymphadenopathy or thryomegaly appreciated. Cor: PMI nondisplaced Huston Foley regular. No rubs, gallops or murmurs. Lungs: clear Abdomen: obese soft, nontender, nondistended. No hepatosplenomegaly. No  bruits or masses. Good bowel sounds. Extremities: no cyanosis, clubbing, rash, edema Neuro: alert & orientedx3, cranial nerves grossly intact. moves all 4 extremities w/o difficulty. Affect pleasant    Imaging: No results found.  EKG: Sinus Gonzalez, 58 bpm. QTC 431. Personally reviewed.    Assessment/Plan:   1. PAF in NSR on Tikosyn - intermittent breakthroughs  -s/p previous AF ablation at [facility]   -AF well controlled on dofetilide   -Continue eliquis.  2. Morbid obesity    --Had lost a lot of weight with [employer] and now has gained some back - Body mass index is 47.36 kg/m. Much improved.  3. Psoriatic Arthritis  - Mark Gonzalez 4. HTN  - BP up  - increase irbesartan to 150 daily - continue weight loss efforts 5. H/o pericarditis felt to be related to Remicade 2015 6. Hyperlipidemia - Recheck fasting lipids. Will stop Niaspan given lack of data. Consider Vascepa 7. Pre-op CV risk assessment.  - mild to moderate risk for peri-op CV complications due mainly to weight (coronary artereis ok)  - Ok to proceed with surgery without further testing   Mark Meres, MD  11:07 AM

## 2019-08-19 NOTE — Addendum Note (Signed)
Encounter addended by: Marisa Hua, RN on: 08/19/2019 11:34 AM  Actions taken: Order list changed, Clinical Note Signed

## 2019-08-19 NOTE — Patient Instructions (Signed)
INCREASE Irbesartan to 150mg  ( 1 tab) daily   Your physician recommends that you schedule a follow-up appointment in: 12 months, please give our office a call in the fall if you do not receive a call to schedule your next appointment.   Please call office at [phone] option 2 if you have any questions or concerns.   At the [facility], you and your health needs are our priority. As part of our continuing mission to provide you with exceptional heart care, we have created designated Provider Care Teams. These Care Teams include your primary Cardiologist (physician) and Advanced Practice Providers (APPs- Physician Assistants and Nurse Practitioners) who all work together to provide you with the care you need, when you need it.   You may see any of the following providers on your designated Care Team at your next follow up: [phone] Dr Marland Kitchen . Dr Arvilla Meres . Marca Ancona, NP . [name], PA . Robbie Lis, PharmD   Please be sure to bring in all your medications bottles to every appointment.

## 2019-08-26 ENCOUNTER — Other Ambulatory Visit (HOSPITAL_COMMUNITY): Payer: Self-pay

## 2019-08-26 MED ORDER — DOFETILIDE 500 MCG PO CAPS: 500.0000 ug | ORAL_CAPSULE | Freq: Two times a day (BID) | ORAL | 5 refills | Status: DC

## 2019-08-26 MED ORDER — LOVASTATIN 20 MG PO TABS: ORAL_TABLET | ORAL | 5 refills | Status: DC

## 2019-08-26 MED ORDER — APIXABAN 5 MG PO TABS: ORAL_TABLET | ORAL | 5 refills | Status: DC

## 2019-08-29 ENCOUNTER — Other Ambulatory Visit: Payer: Self-pay | Admitting: Neurological Surgery

## 2019-09-10 ENCOUNTER — Other Ambulatory Visit (HOSPITAL_COMMUNITY): Payer: Self-pay | Admitting: Neurological Surgery

## 2019-09-10 ENCOUNTER — Other Ambulatory Visit: Payer: Self-pay | Admitting: Neurological Surgery

## 2019-09-10 DIAGNOSIS — M48062 Spinal stenosis, lumbar region with neurogenic claudication: Secondary | ICD-10-CM

## 2019-09-19 ENCOUNTER — Ambulatory Visit (HOSPITAL_COMMUNITY)
Admission: RE | Admit: 2019-09-19 | Discharge: 2019-09-19 | Disposition: A | Discharge status: Home / Self Care | Payer: BC Managed Care – PPO | Source: Ambulatory Visit | Attending: Neurological Surgery | Admitting: Neurological Surgery

## 2019-09-19 ENCOUNTER — Other Ambulatory Visit: Payer: Self-pay

## 2019-09-19 ENCOUNTER — Encounter (HOSPITAL_COMMUNITY): Payer: Self-pay

## 2019-09-19 DIAGNOSIS — Z01818 Encounter for other preprocedural examination: Secondary | ICD-10-CM | POA: Diagnosis not present

## 2019-09-19 DIAGNOSIS — M48062 Spinal stenosis, lumbar region with neurogenic claudication: Secondary | ICD-10-CM | POA: Diagnosis not present

## 2019-09-19 DIAGNOSIS — M48061 Spinal stenosis, lumbar region without neurogenic claudication: Secondary | ICD-10-CM | POA: Diagnosis not present

## 2019-09-19 NOTE — Progress Notes (Signed)
[id number] [address] - [place], [place] - [address] [place] [address] Phone: [phone] Fax: [phone]  [id number] [place], [place] - [address] AT [facility] OF [address] & [facility] [address] [place] [address] Phone: [phone] Fax: [phone]      Your procedure is scheduled on September 24, 2019.  Report to [facility] Main Entrance "A" at 5:30 A.M., and check in at the Admitting office.  Call this number if you have problems the morning of surgery:  [phone]  Call [phone] if you have any questions prior to your surgery date Monday-Friday 8am-4pm    Remember:  Do not eat or drink after midnight the night before your surgery    Take these medicines the morning of surgery with A SIP OF WATER: dofetilide (TIKOSYN) loratadine (CLARITIN) traMADol (ULTRAM) - if needed  Follow your surgeon's instructions on when to stop Eliquis.  If no instructions were given by your surgeon then you will need to call the office to get those instructions.    As of today, STOP taking any Aspirin (unless otherwise instructed by your surgeon), Aleve, Naproxen, Ibuprofen, Motrin, Advil, Goody's, BC's, all herbal medications, fish oil, and all vitamins.    The Morning of Surgery  Do not wear jewelry.  Do not wear lotions, powders, colognes, or deodorant    Men may shave face and neck.  Do not bring valuables to the hospital.  [facility] is not responsible for any belongings or valuables.  If you are a smoker, DO NOT Smoke 24 hours prior to surgery  If you wear a CPAP at night please bring your mask the morning of surgery   Remember that you must have someone to transport you home after your surgery, and remain with you for 24 hours if you are discharged the same day.   Please bring cases for contacts, glasses, hearing aids, dentures or bridgework because it cannot be worn into surgery.     Leave your suitcase in the car.  After surgery it may be brought to your room.  For patients admitted to the hospital, discharge time will be determined by your treatment team.  Patients discharged the day of surgery will not be allowed to drive home.    Special instructions:   [facility]- Preparing For Surgery  Before surgery, you can play an important role. Because skin is not sterile, your skin needs to be as free of germs as possible. You can reduce the number of germs on your skin by washing with CHG (chlorahexidine gluconate) Soap before surgery.  CHG is an antiseptic cleaner which kills germs and bonds with the skin to continue killing germs even after washing.    Oral Hygiene is also important to reduce your risk of infection.  Remember - BRUSH YOUR TEETH THE MORNING OF SURGERY WITH YOUR REGULAR TOOTHPASTE  Please do not use if you have an allergy to CHG or antibacterial soaps. If your skin becomes reddened/irritated stop using the CHG.  Do not shave (including legs and underarms) for at least 48 hours prior to first CHG shower. It is OK to shave your face.  Please follow these instructions carefully.   1. Shower the NIGHT BEFORE SURGERY and the MORNING OF SURGERY with CHG Soap.   2. If you chose to wash your hair, wash your hair first as usual with your normal shampoo.  3. After you shampoo, rinse your hair and body thoroughly  to remove the shampoo.  4. Use CHG as you would any other liquid soap. You can apply CHG directly to the skin and wash gently with a scrungie or a clean washcloth.   5. Apply the CHG Soap to your body ONLY FROM THE NECK DOWN.  Do not use on open wounds or open sores. Avoid contact with your eyes, ears, mouth and genitals (private parts). Wash Face and genitals (private parts)  with your normal soap.   6. Wash thoroughly, paying special attention to the area where your surgery will be performed.  7. Thoroughly rinse your body with warm water from  the neck down.  8. DO NOT shower/wash with your normal soap after using and rinsing off the CHG Soap.  9. Pat yourself dry with a CLEAN TOWEL.  10. Wear CLEAN PAJAMAS to bed the night before surgery, wear comfortable clothes the morning of surgery  11. Place CLEAN SHEETS on your bed the night of your first shower and DO NOT SLEEP WITH PETS.    Day of Surgery:  Please shower the morning of surgery with the CHG soap Do not apply any deodorants/lotions. Please wear clean clothes to the hospital/surgery center.   Remember to brush your teeth WITH YOUR REGULAR TOOTHPASTE.   Please read over the following fact sheets that you were given.

## 2019-09-20 ENCOUNTER — Encounter (HOSPITAL_COMMUNITY)
Admission: RE | Admit: 2019-09-20 | Discharge: 2019-09-20 | Disposition: A | Discharge status: Home / Self Care | Payer: BC Managed Care – PPO | Source: Ambulatory Visit | Attending: Neurological Surgery | Admitting: Neurological Surgery

## 2019-09-20 ENCOUNTER — Other Ambulatory Visit (HOSPITAL_COMMUNITY)
Admission: RE | Admit: 2019-09-20 | Discharge: 2019-09-20 | Disposition: A | Discharge status: Home / Self Care | Payer: BC Managed Care – PPO | Source: Ambulatory Visit | Attending: Neurological Surgery | Admitting: Neurological Surgery

## 2019-09-20 ENCOUNTER — Other Ambulatory Visit: Payer: Self-pay

## 2019-09-20 ENCOUNTER — Encounter (HOSPITAL_COMMUNITY): Payer: Self-pay

## 2019-09-20 DIAGNOSIS — I480 Paroxysmal atrial fibrillation: Secondary | ICD-10-CM | POA: Diagnosis not present

## 2019-09-20 DIAGNOSIS — Z01818 Encounter for other preprocedural examination: Secondary | ICD-10-CM | POA: Insufficient documentation

## 2019-09-20 DIAGNOSIS — L4050 Arthropathic psoriasis, unspecified: Secondary | ICD-10-CM | POA: Insufficient documentation

## 2019-09-20 DIAGNOSIS — Z20822 Contact with and (suspected) exposure to covid-19: Secondary | ICD-10-CM | POA: Insufficient documentation

## 2019-09-20 DIAGNOSIS — Z79899 Other long term (current) drug therapy: Secondary | ICD-10-CM | POA: Diagnosis not present

## 2019-09-20 DIAGNOSIS — Z7901 Long term (current) use of anticoagulants: Secondary | ICD-10-CM | POA: Insufficient documentation

## 2019-09-20 HISTORY — DX: Cardiac arrhythmia, unspecified: I49.9

## 2019-09-20 LAB — TYPE AND SCREEN
ABO/RH(D): A POS
Antibody Screen: NEGATIVE

## 2019-09-20 LAB — BASIC METABOLIC PANEL
Anion gap: 12 (ref 5–15)
BUN: 18 mg/dL (ref 6–20)
CO2: 21 mmol/L — ABNORMAL LOW (ref 22–32)
Calcium: 9.1 mg/dL (ref 8.9–10.3)
Chloride: 108 mmol/L (ref 98–111)
Creatinine, Ser: 0.79 mg/dL (ref 0.61–1.24)
GFR calc Af Amer: >60 mL/min (ref >60)
GFR calc non Af Amer: >60 mL/min (ref >60)
Glucose, Bld: 104 mg/dL — ABNORMAL HIGH (ref 70–99)
Potassium: 4.4 mmol/L (ref 3.5–5.1)
Sodium: 141 mmol/L (ref 135–145)

## 2019-09-20 LAB — CBC
HCT: 49.5 % (ref 39.0–52.0)
Hemoglobin: 16.6 g/dL (ref 13.0–17.0)
MCH: 32.1 pg (ref 26.0–34.0)
MCHC: 33.5 g/dL (ref 30.0–36.0)
MCV: 95.7 fL (ref 80.0–100.0)
Platelets: 140 10*3/uL — ABNORMAL LOW (ref 150–400)
RBC: 5.17 MIL/uL (ref 4.22–5.81)
RDW: 11.9 % (ref 11.5–15.5)
WBC: 3.2 10*3/uL — ABNORMAL LOW (ref 4.0–10.5)
nRBC: 0 % (ref 0.0–0.2)

## 2019-09-20 LAB — SARS CORONAVIRUS 2 (TAT 6-24 HRS): SARS Coronavirus 2: NEGATIVE

## 2019-09-20 LAB — SURGICAL PCR SCREEN
MRSA, PCR: NEGATIVE
Staphylococcus aureus: NEGATIVE

## 2019-09-20 LAB — ABO/RH: ABO/RH(D): A POS

## 2019-09-20 NOTE — Progress Notes (Signed)
PCP:  Ula Lingo, MD Cardiologist:  Nicholes Mango, MD  EKG:  08/19/19 CXR:  N/A ECHO:  10/15/13 Stress Test:  denies Cardiac Cath:  denies   Covid test 09/20/19  Anesthesia Review: Yes, Eliquis last dose 09/21/19 a.m. dose  Patient denies shortness of breath, fever, cough, and chest pain at PAT appointment.  Patient verbalized understanding of instructions provided today at the PAT appointment.  Patient asked to review instructions at home and day of surgery.

## 2019-09-23 ENCOUNTER — Encounter (HOSPITAL_COMMUNITY): Payer: Self-pay | Admitting: Neurological Surgery

## 2019-09-23 MED ORDER — DEXTROSE 5 % IV SOLN
3.0000 g | INTRAVENOUS | Status: AC
  Administered 2019-09-24 (×2): 3 g via INTRAVENOUS
  Filled 2019-09-23: qty 3

## 2019-09-23 NOTE — Progress Notes (Signed)
Anesthesia Chart Review:  Follows with cardiology for history of PAF in NSR on Tikosyn - intermittent breakthroughs, H/o pericarditis felt to be related to Remicade 2015. Last seen by Dr. Gala Romney 08/19/19 and cleared for surgery at that time. Per note, "Pre-op CV risk assessment. - mild to moderate risk for peri-op CV complications due mainly to weight (coronary artereis ok) - Ok to proceed with surgery without further testing ."  Psoriatic arthritis followed by rheumatology, maintained on golimumab.   LD Eliquis 09/21/19 a.m. dose.  Preop labs reviewed. Platelets mildly low 140k. Otherwise unremarkable.   EKG 08/18/10: Sinus bradycardia. Rate 58. Non-specific intra-ventricular conduction delay  Cath 2013: Assessment: 1) Normal coronary arteries 2) Normal LV function   Plan/Discussion: Continue risk factor modification. Consider PPI for chest pain.  TTE 2011: - Left ventricle: The cavity size was normal. Wall thickness was   increased in a pattern of mild LVH. Systolic function was normal.   The estimated ejection fraction was in the range of 55% to 60%.   Wall motion was normal; there were no regional wall motion   abnormalities.  - Mitral valve: Mild regurgitation.  - Left atrium: The atrium was moderately dilated.    Zannie Cove [facility] Short Stay Center/Anesthesiology Phone [phone] 09/23/2019 8:51 AM

## 2019-09-23 NOTE — Anesthesia Preprocedure Evaluation (Addendum)
Anesthesia Evaluation  Patient identified by MRN, date of birth, ID band Patient awake    Reviewed: Allergy & Precautions, NPO status , Patient's Chart, lab work & pertinent test results  History of Anesthesia Complications Negative for: history of anesthetic complications  Airway Mallampati: II  TM Distance: >3 FB Neck ROM: Full    Dental  (+) Dental Advisory Given   Pulmonary neg shortness of breath, neg sleep apnea, neg recent URI, former smoker,    breath sounds clear to auscultation       Cardiovascular hypertension, Pt. on medications + dysrhythmias Atrial Fibrillation  Rhythm:Regular  S/p ablation 2012 H/o pericarditis   Neuro/Psych negative neurological ROS  negative psych ROS   GI/Hepatic negative GI ROS,   Endo/Other  Morbid obesity  Renal/GU negative Renal ROS     Musculoskeletal  (+) Arthritis ,   Abdominal   Peds  Hematology negative hematology ROS (+)   Anesthesia Other Findings   Reproductive/Obstetrics                           Anesthesia Physical Anesthesia Plan  ASA: III  Anesthesia Plan: General   Post-op Pain Management:    Induction: Intravenous  PONV Risk Score and Plan: 2 and Ondansetron and Dexamethasone  Airway Management Planned: Oral ETT  Additional Equipment:   Intra-op Plan:   Post-operative Plan: Possible Post-op intubation/ventilation and Extubation in OR  Informed Consent: I have reviewed the patients History and Physical, chart, labs and discussed the procedure including the risks, benefits and alternatives for the proposed anesthesia with the patient or authorized representative who has indicated his/her understanding and acceptance.     Dental advisory given  Plan Discussed with: CRNA and Surgeon  Anesthesia Plan Comments: (Follows with cardiology for history of PAF in NSR on Tikosyn - intermittent breakthroughs, H/o pericarditis felt  to be related to Remicade 2015. Last seen by Dr. Gala Romney 08/19/19 and cleared for surgery at that time. Per note, "Pre-op CV risk assessment. - mild to moderate risk for peri-op CV complications due mainly to weight (coronary artereis ok) - Ok to proceed with surgery without further testing ."  Psoriatic arthritis followed by rheumatology, maintained on golimumab.   LD Eliquis 09/21/19 a.m. dose.  Preop labs reviewed. Platelets mildly low 140k. Otherwise unremarkable.   EKG 08/18/10: Sinus bradycardia. Rate 58. Non-specific intra-ventricular conduction delay  Cath 2013: Assessment: 1) Normal coronary arteries 2) Normal LV function   Plan/Discussion: Continue risk factor modification. Consider PPI for chest pain.  TTE 2011: - Left ventricle: The cavity size was normal. Wall thickness was   increased in a pattern of mild LVH. Systolic function was normal.   The estimated ejection fraction was in the range of 55% to 60%.   Wall motion was normal; there were no regional wall motion   abnormalities.  - Mitral valve: Mild regurgitation.  - Left atrium: The atrium was moderately dilated. )       Anesthesia Quick Evaluation

## 2019-09-24 ENCOUNTER — Inpatient Hospital Stay (HOSPITAL_COMMUNITY): Payer: BC Managed Care – PPO

## 2019-09-24 ENCOUNTER — Inpatient Hospital Stay (HOSPITAL_COMMUNITY): Payer: BC Managed Care – PPO | Admitting: Certified Registered Nurse Anesthetist

## 2019-09-24 ENCOUNTER — Inpatient Hospital Stay (HOSPITAL_COMMUNITY)
Admission: RE | Admit: 2019-09-24 | Discharge: 2019-09-27 | DRG: 455 | Disposition: A | Discharge status: Home / Self Care | Payer: BC Managed Care – PPO | Attending: Neurological Surgery | Admitting: Neurological Surgery

## 2019-09-24 ENCOUNTER — Inpatient Hospital Stay (HOSPITAL_COMMUNITY)
Admission: RE | Disposition: A | Discharge status: Home / Self Care | Payer: Self-pay | Source: Home / Self Care | Attending: Neurological Surgery

## 2019-09-24 ENCOUNTER — Encounter (HOSPITAL_COMMUNITY): Payer: Self-pay | Admitting: Neurological Surgery

## 2019-09-24 ENCOUNTER — Other Ambulatory Visit: Payer: Self-pay

## 2019-09-24 ENCOUNTER — Inpatient Hospital Stay (HOSPITAL_COMMUNITY): Payer: BC Managed Care – PPO | Admitting: Physician Assistant

## 2019-09-24 DIAGNOSIS — M4316 Spondylolisthesis, lumbar region: Secondary | ICD-10-CM | POA: Diagnosis present

## 2019-09-24 DIAGNOSIS — I10 Essential (primary) hypertension: Secondary | ICD-10-CM | POA: Diagnosis not present

## 2019-09-24 DIAGNOSIS — M48062 Spinal stenosis, lumbar region with neurogenic claudication: Secondary | ICD-10-CM | POA: Diagnosis not present

## 2019-09-24 DIAGNOSIS — M21372 Foot drop, left foot: Secondary | ICD-10-CM | POA: Diagnosis not present

## 2019-09-24 DIAGNOSIS — M5416 Radiculopathy, lumbar region: Secondary | ICD-10-CM | POA: Diagnosis not present

## 2019-09-24 DIAGNOSIS — I4891 Unspecified atrial fibrillation: Secondary | ICD-10-CM | POA: Diagnosis not present

## 2019-09-24 DIAGNOSIS — Z981 Arthrodesis status: Secondary | ICD-10-CM | POA: Diagnosis not present

## 2019-09-24 DIAGNOSIS — M5136 Other intervertebral disc degeneration, lumbar region: Secondary | ICD-10-CM | POA: Diagnosis not present

## 2019-09-24 DIAGNOSIS — M4326 Fusion of spine, lumbar region: Secondary | ICD-10-CM | POA: Diagnosis not present

## 2019-09-24 DIAGNOSIS — I4892 Unspecified atrial flutter: Secondary | ICD-10-CM | POA: Diagnosis not present

## 2019-09-24 DIAGNOSIS — M4726 Other spondylosis with radiculopathy, lumbar region: Secondary | ICD-10-CM | POA: Diagnosis not present

## 2019-09-24 DIAGNOSIS — Z419 Encounter for procedure for purposes other than remedying health state, unspecified: Secondary | ICD-10-CM

## 2019-09-24 HISTORY — PX: LUMBAR PERCUTANEOUS PEDICLE SCREW 2 LEVEL: SHX5561

## 2019-09-24 HISTORY — PX: APPLICATION OF ROBOTIC ASSISTANCE FOR SPINAL PROCEDURE: SHX6753

## 2019-09-24 HISTORY — PX: ANTERIOR LAT LUMBAR FUSION: SHX1168

## 2019-09-24 SURGERY — ANTERIOR LATERAL LUMBAR FUSION 2 LEVELS
Anesthesia: General | Site: Spine Lumbar | Laterality: Right

## 2019-09-24 MED ORDER — CHLORHEXIDINE GLUCONATE CLOTH 2 % EX PADS: 6.0000 | MEDICATED_PAD | Freq: Once | CUTANEOUS | Status: DC

## 2019-09-24 MED ORDER — LIDOCAINE 2% (20 MG/ML) 5 ML SYRINGE
INTRAMUSCULAR | Status: AC
  Filled 2019-09-24: qty 5

## 2019-09-24 MED ORDER — PROPOFOL 10 MG/ML IV BOLUS
INTRAVENOUS | Status: AC
  Filled 2019-09-24: qty 20

## 2019-09-24 MED ORDER — LIDOCAINE-EPINEPHRINE 1 %-1:100000 IJ SOLN
INTRAMUSCULAR | Status: AC
  Filled 2019-09-24: qty 1

## 2019-09-24 MED ORDER — FENTANYL CITRATE (PF) 250 MCG/5ML IJ SOLN
INTRAMUSCULAR | Status: AC
  Filled 2019-09-24: qty 5

## 2019-09-24 MED ORDER — ALUM & MAG HYDROXIDE-SIMETH 200-200-20 MG/5ML PO SUSP: 30.0000 mL | Freq: Four times a day (QID) | ORAL | Status: DC | PRN

## 2019-09-24 MED ORDER — ACETAMINOPHEN 10 MG/ML IV SOLN
1000.0000 mg | Freq: Once | INTRAVENOUS | Status: DC | PRN
  Administered 2019-09-24: 15:00:00 1000 mg via INTRAVENOUS

## 2019-09-24 MED ORDER — PHENYLEPHRINE HCL-NACL 10-0.9 MG/250ML-% IV SOLN
INTRAVENOUS | Status: DC | PRN
  Administered 2019-09-24: 50 ug/min via INTRAVENOUS

## 2019-09-24 MED ORDER — THROMBIN 5000 UNITS EX SOLR
CUTANEOUS | Status: AC
  Filled 2019-09-24: qty 5000

## 2019-09-24 MED ORDER — CEFAZOLIN SODIUM-DEXTROSE 2-4 GM/100ML-% IV SOLN
2.0000 g | Freq: Three times a day (TID) | INTRAVENOUS | Status: AC
  Administered 2019-09-24 – 2019-09-25 (×2): 2 g via INTRAVENOUS
  Filled 2019-09-24 (×2): qty 100

## 2019-09-24 MED ORDER — EPHEDRINE 5 MG/ML INJ
INTRAVENOUS | Status: AC
  Filled 2019-09-24: qty 10

## 2019-09-24 MED ORDER — PROPOFOL 10 MG/ML IV BOLUS
INTRAVENOUS | Status: AC
  Filled 2019-09-24: qty 40

## 2019-09-24 MED ORDER — OXYCODONE HCL 5 MG PO TABS
5.0000 mg | ORAL_TABLET | Freq: Once | ORAL | Status: AC | PRN
  Administered 2019-09-24: 17:00:00 5 mg via ORAL

## 2019-09-24 MED ORDER — PHENYLEPHRINE 40 MCG/ML (10ML) SYRINGE FOR IV PUSH (FOR BLOOD PRESSURE SUPPORT)
PREFILLED_SYRINGE | INTRAVENOUS | Status: AC
  Filled 2019-09-24: qty 10

## 2019-09-24 MED ORDER — EPHEDRINE SULFATE-NACL 50-0.9 MG/10ML-% IV SOSY
PREFILLED_SYRINGE | INTRAVENOUS | Status: DC | PRN
  Administered 2019-09-24 (×2): 5 mg via INTRAVENOUS
  Administered 2019-09-24: 10 mg via INTRAVENOUS
  Administered 2019-09-24 (×2): 5 mg via INTRAVENOUS

## 2019-09-24 MED ORDER — MAGNESIUM CITRATE PO SOLN
1.0000 | Freq: Once | ORAL | Status: AC
  Administered 2019-09-24: 21:00:00 1 via ORAL
  Filled 2019-09-24: qty 296

## 2019-09-24 MED ORDER — 0.9 % SODIUM CHLORIDE (POUR BTL) OPTIME
TOPICAL | Status: DC | PRN
  Administered 2019-09-24: 1000 mL

## 2019-09-24 MED ORDER — METHOCARBAMOL 500 MG PO TABS
500.0000 mg | ORAL_TABLET | Freq: Four times a day (QID) | ORAL | Status: DC | PRN
  Administered 2019-09-24 – 2019-09-27 (×10): 500 mg via ORAL
  Filled 2019-09-24 (×10): qty 1

## 2019-09-24 MED ORDER — DOFETILIDE 500 MCG PO CAPS
500.0000 ug | ORAL_CAPSULE | Freq: Two times a day (BID) | ORAL | Status: DC
  Administered 2019-09-24 – 2019-09-27 (×7): 500 ug via ORAL
  Filled 2019-09-24 (×7): qty 1

## 2019-09-24 MED ORDER — OXYCODONE-ACETAMINOPHEN 5-325 MG PO TABS
1.0000 | ORAL_TABLET | ORAL | Status: DC | PRN
  Administered 2019-09-24 – 2019-09-27 (×15): 2 via ORAL
  Filled 2019-09-24 (×15): qty 2

## 2019-09-24 MED ORDER — ACETAMINOPHEN 10 MG/ML IV SOLN
INTRAVENOUS | Status: AC
  Filled 2019-09-24: qty 100

## 2019-09-24 MED ORDER — LACTATED RINGERS IV SOLN: INTRAVENOUS | Status: DC | PRN

## 2019-09-24 MED ORDER — BISACODYL 10 MG RE SUPP: 10.0000 mg | Freq: Every day | RECTAL | Status: DC | PRN

## 2019-09-24 MED ORDER — POTASSIUM CHLORIDE CRYS ER 20 MEQ PO TBCR: 40.0000 meq | EXTENDED_RELEASE_TABLET | ORAL | Status: DC

## 2019-09-24 MED ORDER — ONDANSETRON HCL 4 MG PO TABS: 4.0000 mg | ORAL_TABLET | Freq: Four times a day (QID) | ORAL | Status: DC | PRN

## 2019-09-24 MED ORDER — FENTANYL CITRATE (PF) 100 MCG/2ML IJ SOLN
25.0000 ug | INTRAMUSCULAR | Status: DC | PRN
  Administered 2019-09-24 (×2): 50 ug via INTRAVENOUS
  Administered 2019-09-24 (×2): 25 ug via INTRAVENOUS

## 2019-09-24 MED ORDER — METHOCARBAMOL 1000 MG/10ML IJ SOLN
500.0000 mg | Freq: Four times a day (QID) | INTRAVENOUS | Status: DC | PRN
  Filled 2019-09-24: qty 5

## 2019-09-24 MED ORDER — ONDANSETRON HCL 4 MG/2ML IJ SOLN
INTRAMUSCULAR | Status: AC
  Filled 2019-09-24: qty 2

## 2019-09-24 MED ORDER — THROMBIN 5000 UNITS EX SOLR
OROMUCOSAL | Status: DC | PRN
  Administered 2019-09-24 (×3): 5 mL via TOPICAL

## 2019-09-24 MED ORDER — BUPIVACAINE HCL (PF) 0.5 % IJ SOLN
INTRAMUSCULAR | Status: AC
  Filled 2019-09-24: qty 30

## 2019-09-24 MED ORDER — PRAVASTATIN SODIUM 10 MG PO TABS
20.0000 mg | ORAL_TABLET | Freq: Every day | ORAL | Status: DC
  Administered 2019-09-24 – 2019-09-27 (×4): 20 mg via ORAL
  Filled 2019-09-24 (×4): qty 2

## 2019-09-24 MED ORDER — BUPIVACAINE HCL (PF) 0.5 % IJ SOLN
INTRAMUSCULAR | Status: DC | PRN
  Administered 2019-09-24: 22 mL
  Administered 2019-09-24: 7.5 mL
  Administered 2019-09-24: 5 mL

## 2019-09-24 MED ORDER — LACTATED RINGERS IV SOLN: INTRAVENOUS | Status: DC

## 2019-09-24 MED ORDER — DEXAMETHASONE SODIUM PHOSPHATE 10 MG/ML IJ SOLN
INTRAMUSCULAR | Status: DC | PRN
  Administered 2019-09-24: 10 mg via INTRAVENOUS

## 2019-09-24 MED ORDER — OXYCODONE HCL 5 MG/5ML PO SOLN: 5.0000 mg | Freq: Once | ORAL | Status: AC | PRN

## 2019-09-24 MED ORDER — CEFAZOLIN SODIUM 1 G IJ SOLR
INTRAMUSCULAR | Status: AC
  Filled 2019-09-24: qty 30

## 2019-09-24 MED ORDER — ACETAMINOPHEN 650 MG RE SUPP: 650.0000 mg | RECTAL | Status: DC | PRN

## 2019-09-24 MED ORDER — MORPHINE SULFATE (PF) 2 MG/ML IV SOLN: 2.0000 mg | INTRAVENOUS | Status: DC | PRN

## 2019-09-24 MED ORDER — MIDAZOLAM HCL 5 MG/5ML IJ SOLN
INTRAMUSCULAR | Status: DC | PRN
  Administered 2019-09-24: 2 mg via INTRAVENOUS

## 2019-09-24 MED ORDER — MENTHOL 3 MG MT LOZG: 1.0000 | LOZENGE | OROMUCOSAL | Status: DC | PRN

## 2019-09-24 MED ORDER — SUCCINYLCHOLINE CHLORIDE 200 MG/10ML IV SOSY
PREFILLED_SYRINGE | INTRAVENOUS | Status: DC | PRN
  Administered 2019-09-24: 180 mg via INTRAVENOUS

## 2019-09-24 MED ORDER — ONDANSETRON HCL 4 MG/2ML IJ SOLN: 4.0000 mg | Freq: Four times a day (QID) | INTRAMUSCULAR | Status: DC | PRN

## 2019-09-24 MED ORDER — PHENOL 1.4 % MT LIQD: 1.0000 | OROMUCOSAL | Status: DC | PRN

## 2019-09-24 MED ORDER — ACETAMINOPHEN 325 MG PO TABS: 650.0000 mg | ORAL_TABLET | ORAL | Status: DC | PRN

## 2019-09-24 MED ORDER — FUROSEMIDE 40 MG PO TABS
40.0000 mg | ORAL_TABLET | ORAL | Status: DC
  Administered 2019-09-26: 09:00:00 40 mg via ORAL
  Filled 2019-09-24: qty 1

## 2019-09-24 MED ORDER — SUCCINYLCHOLINE CHLORIDE 200 MG/10ML IV SOSY
PREFILLED_SYRINGE | INTRAVENOUS | Status: AC
  Filled 2019-09-24: qty 10

## 2019-09-24 MED ORDER — TRAMADOL HCL 50 MG PO TABS: 50.0000 mg | ORAL_TABLET | Freq: Four times a day (QID) | ORAL | Status: DC | PRN

## 2019-09-24 MED ORDER — LIDOCAINE 2% (20 MG/ML) 5 ML SYRINGE
INTRAMUSCULAR | Status: DC | PRN
  Administered 2019-09-24: 80 mg via INTRAVENOUS

## 2019-09-24 MED ORDER — DOCUSATE SODIUM 100 MG PO CAPS
100.0000 mg | ORAL_CAPSULE | Freq: Two times a day (BID) | ORAL | Status: DC
  Administered 2019-09-24 – 2019-09-27 (×7): 100 mg via ORAL
  Filled 2019-09-24 (×7): qty 1

## 2019-09-24 MED ORDER — PHENYLEPHRINE 40 MCG/ML (10ML) SYRINGE FOR IV PUSH (FOR BLOOD PRESSURE SUPPORT)
PREFILLED_SYRINGE | INTRAVENOUS | Status: DC | PRN
  Administered 2019-09-24 (×2): 120 ug via INTRAVENOUS
  Administered 2019-09-24: 80 ug via INTRAVENOUS

## 2019-09-24 MED ORDER — BSS IO SOLN
15.0000 mL | Freq: Once | INTRAOCULAR | Status: AC
  Administered 2019-09-24: 17:00:00 15 mL
  Filled 2019-09-24: qty 15

## 2019-09-24 MED ORDER — OXYCODONE HCL 5 MG PO TABS
ORAL_TABLET | ORAL | Status: AC
  Filled 2019-09-24: qty 1

## 2019-09-24 MED ORDER — ACETAMINOPHEN 160 MG/5ML PO SOLN: 1000.0000 mg | Freq: Once | ORAL | Status: DC | PRN

## 2019-09-24 MED ORDER — ALBUMIN HUMAN 5 % IV SOLN: INTRAVENOUS | Status: DC | PRN

## 2019-09-24 MED ORDER — KETOROLAC TROMETHAMINE 0.5 % OP SOLN
1.0000 [drp] | Freq: Three times a day (TID) | OPHTHALMIC | Status: AC | PRN
  Administered 2019-09-24: 17:00:00 1 [drp] via OPHTHALMIC
  Filled 2019-09-24: qty 5

## 2019-09-24 MED ORDER — KETOROLAC TROMETHAMINE 0.5 % OP SOLN
OPHTHALMIC | Status: AC
  Filled 2019-09-24: qty 5

## 2019-09-24 MED ORDER — FLEET ENEMA 7-19 GM/118ML RE ENEM: 1.0000 | ENEMA | Freq: Once | RECTAL | Status: DC | PRN

## 2019-09-24 MED ORDER — SODIUM CHLORIDE 0.9 % IV SOLN
INTRAVENOUS | Status: DC | PRN
  Administered 2019-09-24: 500 mL

## 2019-09-24 MED ORDER — FENTANYL CITRATE (PF) 100 MCG/2ML IJ SOLN
INTRAMUSCULAR | Status: AC
  Filled 2019-09-24: qty 2

## 2019-09-24 MED ORDER — SODIUM CHLORIDE 0.9% FLUSH: 3.0000 mL | INTRAVENOUS | Status: DC | PRN

## 2019-09-24 MED ORDER — KETOROLAC TROMETHAMINE 15 MG/ML IJ SOLN
INTRAMUSCULAR | Status: AC
  Filled 2019-09-24: qty 1

## 2019-09-24 MED ORDER — LORATADINE 10 MG PO TABS
10.0000 mg | ORAL_TABLET | Freq: Every day | ORAL | Status: DC
  Administered 2019-09-24 – 2019-09-27 (×4): 10 mg via ORAL
  Filled 2019-09-24 (×4): qty 1

## 2019-09-24 MED ORDER — FENTANYL CITRATE (PF) 100 MCG/2ML IJ SOLN
INTRAMUSCULAR | Status: DC | PRN
  Administered 2019-09-24 (×4): 50 ug via INTRAVENOUS
  Administered 2019-09-24: 100 ug via INTRAVENOUS
  Administered 2019-09-24 (×4): 50 ug via INTRAVENOUS

## 2019-09-24 MED ORDER — DEXAMETHASONE SODIUM PHOSPHATE 10 MG/ML IJ SOLN
INTRAMUSCULAR | Status: AC
  Filled 2019-09-24: qty 1

## 2019-09-24 MED ORDER — KETOROLAC TROMETHAMINE 15 MG/ML IJ SOLN
15.0000 mg | Freq: Four times a day (QID) | INTRAMUSCULAR | Status: AC
  Administered 2019-09-24 – 2019-09-25 (×4): 15 mg via INTRAVENOUS
  Filled 2019-09-24 (×4): qty 1

## 2019-09-24 MED ORDER — LIDOCAINE-EPINEPHRINE 1 %-1:100000 IJ SOLN
INTRAMUSCULAR | Status: DC | PRN
  Administered 2019-09-24: 12 mL
  Administered 2019-09-24: 7.5 mL
  Administered 2019-09-24: 5 mL

## 2019-09-24 MED ORDER — BSS IO SOLN
INTRAOCULAR | Status: AC
  Filled 2019-09-24: qty 15

## 2019-09-24 MED ORDER — POLYETHYLENE GLYCOL 3350 17 G PO PACK: 17.0000 g | PACK | Freq: Every day | ORAL | Status: DC | PRN

## 2019-09-24 MED ORDER — PROPOFOL 10 MG/ML IV BOLUS
INTRAVENOUS | Status: DC | PRN
  Administered 2019-09-24: 200 mg via INTRAVENOUS

## 2019-09-24 MED ORDER — MIDAZOLAM HCL 2 MG/2ML IJ SOLN
INTRAMUSCULAR | Status: AC
  Filled 2019-09-24: qty 2

## 2019-09-24 MED ORDER — PROPOFOL 500 MG/50ML IV EMUL
INTRAVENOUS | Status: DC | PRN
  Administered 2019-09-24 (×2): 75 ug/kg/min via INTRAVENOUS
  Administered 2019-09-24: 50 ug/kg/min via INTRAVENOUS

## 2019-09-24 MED ORDER — ACETAMINOPHEN 500 MG PO TABS: 1000.0000 mg | ORAL_TABLET | Freq: Once | ORAL | Status: DC | PRN

## 2019-09-24 MED ORDER — ONDANSETRON HCL 4 MG/2ML IJ SOLN
INTRAMUSCULAR | Status: DC | PRN
  Administered 2019-09-24: 4 mg via INTRAVENOUS

## 2019-09-24 MED ORDER — SENNA 8.6 MG PO TABS
1.0000 | ORAL_TABLET | Freq: Two times a day (BID) | ORAL | Status: DC
  Administered 2019-09-24 – 2019-09-27 (×7): 8.6 mg via ORAL
  Filled 2019-09-24 (×7): qty 1

## 2019-09-24 MED ORDER — IRBESARTAN 150 MG PO TABS
150.0000 mg | ORAL_TABLET | Freq: Every day | ORAL | Status: DC
  Administered 2019-09-24 – 2019-09-27 (×4): 150 mg via ORAL
  Filled 2019-09-24 (×4): qty 1

## 2019-09-24 MED ORDER — SODIUM CHLORIDE 0.9% FLUSH
3.0000 mL | Freq: Two times a day (BID) | INTRAVENOUS | Status: DC
  Administered 2019-09-24 – 2019-09-25 (×2): 3 mL via INTRAVENOUS

## 2019-09-24 SURGICAL SUPPLY — 96 items
ADH SKN CLS APL DERMABOND .7 (GAUZE/BANDAGES/DRESSINGS) ×9
BAG DECANTER FOR FLEXI CONT (MISCELLANEOUS) ×5 IMPLANT
BASKET BONE COLLECTION (BASKET) ×2 IMPLANT
BIT DRILL LONG 3.0X30 (BIT) ×1 IMPLANT
BIT DRILL LONG 3.0X30MM (BIT) ×1
BIT DRILL LONG 3X80 (BIT) IMPLANT
BIT DRILL LONG 3X80MM (BIT)
BIT DRILL LONG 4X80 (BIT) IMPLANT
BIT DRILL LONG 4X80MM (BIT)
BIT DRILL SHORT 3.0X30 (BIT) IMPLANT
BIT DRILL SHORT 3.0X30MM (BIT)
BIT DRILL SHORT 3X80 (BIT) IMPLANT
BIT DRILL SHORT 3X80MM (BIT)
BLADE CLIPPER SURG (BLADE) IMPLANT
BLADE SURG 11 STRL SS (BLADE) ×3 IMPLANT
BONE MATRIX OSTEOCEL PRO MED (Bone Implant) ×4 IMPLANT
CAGE COROENT MP 8X23 (Cage) ×2 IMPLANT
CARTRIDGE OIL MAESTRO DRILL (MISCELLANEOUS) ×3 IMPLANT
CLIP NEUROVISION LG (CLIP) ×2 IMPLANT
CNTNR URN SCR LID CUP LEK RST (MISCELLANEOUS) ×3 IMPLANT
CONT SPEC 4OZ STRL OR WHT (MISCELLANEOUS) ×5
COROENT XL 12X22X55 (Orthopedic Implant) ×2 IMPLANT
COVER BACK TABLE 24X17X13 BIG (DRAPES) IMPLANT
COVER BACK TABLE 60X90IN (DRAPES) ×5 IMPLANT
COVER WAND RF STERILE (DRAPES) ×9 IMPLANT
DERMABOND ADVANCED (GAUZE/BANDAGES/DRESSINGS) ×6
DERMABOND ADVANCED .7 DNX12 (GAUZE/BANDAGES/DRESSINGS) ×9 IMPLANT
DEVICE DISSECT PLASMABLAD 3.0S (MISCELLANEOUS) IMPLANT
DIFFUSER DRILL AIR PNEUMATIC (MISCELLANEOUS) ×3 IMPLANT
DRAPE C-ARM 42X72 X-RAY (DRAPES) ×12 IMPLANT
DRAPE C-ARMOR (DRAPES) ×10 IMPLANT
DRAPE LAPAROTOMY 100X72X124 (DRAPES) ×10 IMPLANT
DRAPE SHEET LG 3/4 BI-LAMINATE (DRAPES) ×3 IMPLANT
DRSG OPSITE POSTOP 4X6 (GAUZE/BANDAGES/DRESSINGS) ×6 IMPLANT
DURAPREP 26ML APPLICATOR (WOUND CARE) ×10 IMPLANT
ELECT BLADE 4.0 EZ CLEAN MEGAD (MISCELLANEOUS) ×5
ELECT REM PT RETURN 9FT ADLT (ELECTROSURGICAL) ×10
ELECTRODE BLDE 4.0 EZ CLN MEGD (MISCELLANEOUS) IMPLANT
ELECTRODE REM PT RTRN 9FT ADLT (ELECTROSURGICAL) ×6 IMPLANT
GAUZE 4X4 16PLY RFD (DISPOSABLE) ×2 IMPLANT
GLOVE BIOGEL PI IND STRL 7.0 (GLOVE) IMPLANT
GLOVE BIOGEL PI IND STRL 7.5 (GLOVE) IMPLANT
GLOVE BIOGEL PI IND STRL 8.5 (GLOVE) ×9 IMPLANT
GLOVE BIOGEL PI INDICATOR 7.0 (GLOVE) ×2
GLOVE BIOGEL PI INDICATOR 7.5 (GLOVE) ×4
GLOVE BIOGEL PI INDICATOR 8.5 (GLOVE) ×4
GLOVE ECLIPSE 8.5 STRL (GLOVE) ×13 IMPLANT
GLOVE EXAM NITRILE XL STR (GLOVE) IMPLANT
GLOVE SURG SS PI 7.0 STRL IVOR (GLOVE) ×4 IMPLANT
GLOVE SURG SS PI 7.5 STRL IVOR (GLOVE) ×12 IMPLANT
GOWN STRL REUS W/ TWL LRG LVL3 (GOWN DISPOSABLE) IMPLANT
GOWN STRL REUS W/ TWL XL LVL3 (GOWN DISPOSABLE) ×9 IMPLANT
GOWN STRL REUS W/TWL 2XL LVL3 (GOWN DISPOSABLE) ×10 IMPLANT
GOWN STRL REUS W/TWL LRG LVL3 (GOWN DISPOSABLE) ×20
GOWN STRL REUS W/TWL XL LVL3 (GOWN DISPOSABLE) ×5
GUIDEWIRE NITINOL BEVEL TIP (WIRE) ×12 IMPLANT
HEMOSTAT POWDER KIT SURGIFOAM (HEMOSTASIS) ×6 IMPLANT
KIT BASIN OR (CUSTOM PROCEDURE TRAY) ×10 IMPLANT
KIT DILATOR XLIF 5 (KITS) ×1 IMPLANT
KIT SPINE MAZOR X ROBO DISP (MISCELLANEOUS) ×5 IMPLANT
KIT SURGICAL ACCESS MAXCESS 4 (KITS) ×2 IMPLANT
KIT TURNOVER KIT B (KITS) ×8 IMPLANT
KIT XLIF (KITS) ×1
MARKER SKIN DUAL TIP RULER LAB (MISCELLANEOUS) ×7 IMPLANT
MODULE NVM5 NEXT GEN EMG (NEEDLE) ×2 IMPLANT
NDL HYPO 25X1 1.5 SAFETY (NEEDLE) ×6 IMPLANT
NDL SPNL 22GX3.5 QUINCKE BK (NEEDLE) IMPLANT
NEEDLE HYPO 25X1 1.5 SAFETY (NEEDLE) ×10 IMPLANT
NEEDLE SPNL 22GX3.5 QUINCKE BK (NEEDLE) ×5 IMPLANT
NS IRRIG 1000ML POUR BTL (IV SOLUTION) ×8 IMPLANT
OIL CARTRIDGE MAESTRO DRILL (MISCELLANEOUS)
PACK LAMINECTOMY NEURO (CUSTOM PROCEDURE TRAY) ×10 IMPLANT
PAD ARMBOARD 7.5X6 YLW CONV (MISCELLANEOUS) ×27 IMPLANT
PATTIES SURGICAL .5 X1 (DISPOSABLE) ×2 IMPLANT
PIN HEAD 2.5X60MM (PIN) IMPLANT
PLASMABLADE 3.0S (MISCELLANEOUS) ×5
ROD RELINE MAS LORD 5.5X75MM (Rod) ×2 IMPLANT
ROD RELINE TI MAS 5.5X70MM LD (Rod) ×2 IMPLANT
SCREW LOCK RELINE 5.5 TULIP (Screw) ×12 IMPLANT
SCREW MAS RELINE 6.5X45 POLY (Screw) ×4 IMPLANT
SCREW MAS RELINE 6.5X50 POLY (Screw) ×8 IMPLANT
SCREW SCHANZ SA 4.0MM (MISCELLANEOUS) ×4 IMPLANT
SPONGE LAP 4X18 RFD (DISPOSABLE) IMPLANT
SUT VIC AB 1 CT1 18XBRD ANBCTR (SUTURE) ×6 IMPLANT
SUT VIC AB 1 CT1 8-18 (SUTURE) ×10
SUT VIC AB 2-0 CP2 18 (SUTURE) ×12 IMPLANT
SUT VIC AB 3-0 SH 8-18 (SUTURE) ×15 IMPLANT
SUT VIC AB 4-0 RB1 18 (SUTURE) ×6 IMPLANT
SYR 3ML LL SCALE MARK (SYRINGE) ×4 IMPLANT
SYR BULB IRRIGATION 50ML (SYRINGE) ×2 IMPLANT
TAPE CLOTH 4X10 WHT NS (GAUZE/BANDAGES/DRESSINGS) ×8 IMPLANT
TOWEL GREEN STERILE (TOWEL DISPOSABLE) ×10 IMPLANT
TOWEL GREEN STERILE FF (TOWEL DISPOSABLE) ×8 IMPLANT
TRAY FOLEY MTR SLVR 16FR STAT (SET/KITS/TRAYS/PACK) ×6 IMPLANT
TUBE MAZOR SA REDUCTION (TUBING) ×5 IMPLANT
WATER STERILE IRR 1000ML POUR (IV SOLUTION) ×8 IMPLANT

## 2019-09-24 NOTE — H&P (Signed)
Chief complaint: back and bilateral neck pain HPI: The patient is a 59 year old individual who I had evaluated about 7 yrs ago. I evaluated him for spondylitic stenosis and degenerative spondylolisthesis at L3-L4.  We treated him conservatively at that time, and he notes that after a program of some physical therapy and conservative management, he did reasonably well.  He notes that in the past year he has had substantial increase in symptoms, and because of the Coronavirus process, he has had to become less active, but he also notes that he could not resume this level of activity that he had been enjoying.  He notes that he made big efforts to lose a considerable amount of weight, but in the last year, because of increasing back pain and leg pain, he has put on about an additional 60+ pounds, and he finds that his capacity to stand for even a brief period of time, and walk even a brief distance, is severely reduced.  He notes that 10 minutes on his feet needs to have him sit down to the rest a bit, and he finds also that sleeping has become being interfered with, as he cannot turn comfortably in bed, and he typically enjoys resting on his sides, but this is becoming increasingly uncomfortable.  He notes that the pain radiates into the lower extremities, and he feels that his right leg particularly has weaken, such that riding his bike is difficult to pedal down on that right side.  He also notes that stepping up he is not able to do without some additional assistance from his upper extremities.  His past medical history, otherwise, has remained fairly stable.     Current medications include Simponi Aria infuse every 8 weeks, Lovastatin, Dofetilide, Eliquis, Furosemide, Potassium, Irbesartan, Diclofenac. He is also on a number of supplements.   His systems review is notable for a history of irregular pulse with atrial fibrillation, weight gain, arm pain, and leg swelling, joint pain, joint swelling, numbness  and tingling in the lower extremities on a 14-point review sheet.     His physical exam demonstrates that he is able to stand straight and erect, though he does tend to favor a slight forward stoop.  His motor function demonstrates that he has some modest weakness in the iliopsoas and quadriceps complex, a bit worse on the right when testing to step on a 7-inch stool.  His strength otherwise in the major groups, including the tibialis anterior and the gastrocs is intact.   Review of an MRI that was recently performed in August of this past year demonstrates that there is severe stenosis at the level of L3-4 and L4-5.  He has some borderline stenosis at L2-3, but the process that is aggravating his symptoms most severely is clearly coming from the L3-4 level, where he has nearly complete obliteration of his spinal canal. L4-5 is perhaps just only slightly less severe than L3-4.  The alignment of his spine is otherwise good, and he does have the grade 1 spondylolisthesis at L3-4.  Given the weakness that he is exhibiting in his lower extremities, and the progressive symptoms that he has had over the years, I believe at this point, that while he needs to consider surgical decompression and stabilization.  I noted that in the past we would consider an open decompression and posterior fixation, which is a substantial undertaking, but given the nature of this process having evolved over a number of years, given also his condition size-wise,  I believe that he would be better served with an indirect decompression using a lateral approach to L3-4 and L4-5 to re-establish the height of the discs, distract the interspinous ligaments, and then perform posterior fixation via a percutaneous route using pedicle screws with robotic assistance for placement of those.  This would help to stabilize the spine from L3 to L5 and provide an adequate decompression of the major neural elements.  The surgery itself takes about 5 hours or  more to do; however, I believe the total morbidity, blood loss, and potential for complications is less with this procedure than considering a more standard open procedure.  I don't feel that further efforts at conservative management, he did note that he had a couple of injections by Dr.Derry, which did not yield much improvement, is going to be of any benefit for him, and I believe at this point he truly needs to consider the surgical intervention for this process. He is admitted for surgery.  Impression: L3-4 and L4-5 spondylosis with spondylolisthesis and neurogenic claudication and radiculopathy  Plan: Anterolateral decompression with X-lif technique L3-4 L4-5. Posterior stabilization L3-L5 with pedicle screw fixation with robotic assist

## 2019-09-24 NOTE — Transfer of Care (Signed)
Immediate Anesthesia Transfer of Care Note  Patient: Mark Gonzalez.  Procedure(s) Performed: Right Lumbar Three-Four Lumbar Four-Five Anterolateral decompression/fusion/percutaneous pedicle screw fixation (Right Spine Lumbar) Introlateral Decompression Lumbar Three-Four with XLIFT Spacer, Arthrodesis with Allograft, Posterior Fixation Lumbar Three-Five with Percutaneous screws with Robotic Assistance. Laminectomy Lumbar Four-Five Posterior Lumbar Interbody Arthrodesis Unilateral Lumbar four-Five On Left, Posterior Lateral Arthrodesis Lumbar Four-Five Bilaterally. (N/A Spine Lumbar) APPLICATION OF ROBOTIC ASSISTANCE FOR SPINAL PROCEDURE (N/A )  Patient Location: PACU  Anesthesia Type:General  Level of Consciousness: awake, alert  and oriented  Airway & Oxygen Therapy: Patient Spontanous Breathing and Patient connected to face mask oxygen  Post-op Assessment: Report given to RN and Post -op Vital signs reviewed and stable  Post vital signs: Reviewed and stable  Last Vitals:  Vitals Value Taken Time  BP 141/96 09/24/19 1459  Temp    Pulse 81 09/24/19 1502  Resp 10 09/24/19 1502  SpO2 100 % 09/24/19 1502  Vitals shown include unvalidated device data.  Last Pain:  Vitals:   09/24/19 0646  PainSc: 3       Patients Stated Pain Goal: 5 (09/24/19 8563)  Complications: No apparent anesthesia complications

## 2019-09-24 NOTE — Op Note (Signed)
Date of surgery: 09/24/2019 Preoperative diagnosis: Spondylosis and stenosis L3-4 and L4-5 with lumbar radiculopathy, neurogenic claudication. Postoperative diagnosis: Same Procedure: Anterolateral decompression L3-L4 with placement of XLIF spacer and arthrodesis using allograft.  EMG nerve root monitoring from beginning to the end of the case.  Fluoroscopic guidance.  Posterior percutaneous fixation from L3-L5 with robotic assistance and placement of pedicle screws posterior interbody arthrodesis L4-L5 with decompression of L4-L5 with bilateral laminectomies.  Posterior lateral arthrodesis using local autograft allograft and osteocell. Surgeon: Barnett Abu First assistant: Hoyt Koch, MD Anesthesia: General endotracheal Indications: Mark Gonzalez is a 59 year old individuals had progressive back pain and weakness in his lower extremities he was found to have severe spondylitic stenosis at L3-4 and L4-5 and after careful consideration of his options he was advised regarding surgical decompression and stabilization of both these areas.  He was initially planned to do an anterolateral indirect decompression at L3-4 and L4-5 however at the time of the procedure accessing the L4-L5 interspace was very difficult secondary to the patient's position of his posterior suprailiac crest and the rigidity of L4-L5 and obtaining and placing any instrumentation.  Beyond that the lumbar plexus seem to be immediately in the way of the lateral aspect of the disc space which made placing the retractor in a proper position very difficult without significant nerve root irritability.  For this reason an XLIF at L4-L5 was abandoned, and it was decided to perform the fixation as planned with the posterior decompression of L4-L5 and possible posterior interbody grafting if necessary.  Procedure: Patient was brought to the operating room supine on the stretcher.  After the smooth induction of general endotracheal anesthesia he  was placed onto the operating table in the left lateral decubitus position.  The bony prominences were appropriately padded and protected and he was taped into position and orthogonal fashion and positioning was checked with fluoroscopic images.  When the final position was obtained best he was secured to the operating table with tape across the iliac crest taped across the chest wall and tape of the lower extremities in a knee-to-chest position.  Position was again confirmed radiographically and the skin was marked for a laterally placed incision between L3-4 and L4-5.  This as it turns out was just immediately above the iliac crest on the right side.  A second incision was placed in the paramedian region.  The skin was prepped with alcohol DuraPrep and draped in a sterile fashion and 2 incisions were created and dissection was carried down bluntly through the subcutaneous fat to the outer layer of the fascia.  A blunt probe was then passed through the lateral aspect of the fascia and a second incision was created posteriorly with the fascia being probed directly and opened into the retroperitoneal space the finger was admitted into the retroperitoneal space and then the probe from the lateral aspect was carefully passed to this region the lateral aspect of the psoas muscle was noted to be very thick and large and it was entered with the probe and rested upon the disc space at L4-L5.  We then attempted to place a K wire into the L4-5 disc space which we were able to do but then placing dilators over this region yielded some irritability of the lumbar plexus a second attempt was made to place the probe into the interspace and further irritability was noted but we were able to dock a retractor onto the lateral aspect as the retractor was open it was clearly evident  that there was several of the nerve roots of the lumbar plexus that were passing nearby the placement for the proposed shim.  Exploring this region  yielded no good position to retract the nerve roots adequately and the steep angle of approach also would make it difficult to do any kind of a discectomy.  At this point is decided to abandon the XLIF approach at the L4-L5 level.  We then proceeded to instrument L3-L4 by placing a K wire into the interspace over a series of probes that were dilated out to the 41mm size.  Then a 180 mm long retractor was placed over the outer probe and careful stimulation in this region yielded no elements of the lumbar plexus that were nearby or involved once the retractor was placed it was opened gradually after being secured to the operating table with a clamp a shim was placed posteriorly in the L3-L4 disc space and then the disc space was opened with a #15 blade and a combination of curettes and rongeurs was used to evacuate as moderately large amount of disc material in the space.  Ultimately we were able to dilate the space to allow placement of a 12 x 22 x 55 mm spacer with 10 degrees of lordosis.  Endplates were carefully curettaged and the interspace was then secured with the spacer after it was filled with ostia cell bone graft.  AP and lateral fluoroscopy was used to verify its position and when verified the retractor was ultimately removed and the lateral incisions were closed with 2-0 Vicryl in the fascia and 3-0 Vicryl subcuticularly.  Posterior incision was closed in a similar fashion.  During this time neural monitoring was performed and aside from the irritability at the L4-5 level there is minimal if any irritability at the L3-4 level the patient was then turned prone onto a Springer table and carefully positioned.  With this the robotic arm was attached to the Ethridge table and then after prepping the back with alcohol DuraPrep and draped in a sterile fashion a Steinmann pin was placed in the right posterior suprailiac crest to allow attachment of the robotic arm to the patient.  Registration radiographs were  obtained in AP and oblique position and when registration was complete a pedicle entry sites were checked chosen on a plan that had been preplanned preoperatively.  Screws were placed in L3-L4 and L5 after we were able to robotically place K wires into the each of these areas during the placement of the screws through small paramedian incision that was made over the guide direction of the robot we placed 6.5 x 50 mm screws in L3 and L4 and 6.5 x 45 mm screws in L5 EMG monitoring was performed during the placement of the screws and no evidence of any cut out or irritability of the nerve roots in the lumbar plexus was observed during the placement of the screws once all the screws were placed the robotic arm was disarticulated and 2 rods were passed percutaneously a 75 mm rod on the left and a 570 mm rod on the right these were tightened in a neutral construct.  Once the hardware was placed and affixed posteriorly then days midline incision was created between L3 and L5 and laminectomy was performed after doing a subperiosteal dissection interlaminar space at L4 and L5.  The L4-5 space was visualized and then confirmed radiographically and bilateral laminotomies were created removing the inferior margin lamina of L4 out to the mesial wall of  the facet and superior mesial facetectomy was performed also on each level this allowed good decompression of central canal and lateral recesses including the L5 nerve root inferiorly and the L4 nerve root superiorly.  Once the decompression was obtained inspection of the disc space was performed and on the left side the space was accessible a discectomy was performed removing some endplate material gradually dilating the space to allow placement of a posterior interbody spacer with autograft and allograft.  The allograft was in the form of the osteocell.  An 8 x 9 x 23 mm spacer with 4 degrees of lordosis was filled with autograft and allograft and placed into the interspace.   Posterior lateral grafting was performed with approximately 3 cc of bone graft in each lateral gutter and the denuded facet joints between L4 and L5.  The decompression being secured hemostasis in the soft tissues was secured and then the lumbodorsal fascia was closed with #1 Vicryl interrupted fashion 2-0 Vicryl was used in subcutaneous tissues 3-0 Vicryl subcuticularly all the previous incisions were closed with 2-0 Vicryl in the subcutaneous tissues and 3-0 Vicryl and 4-0 Vicryl in the subcuticular tissues to close the skin.  Blood loss for the entire procedure was estimated 200 cc by sterile dressings were placed over Dermabond on the incisions.  The Steinmann pin had been removed from the right posterior suprailiac crest and this incision was closed with 4-0 Vicryl.  The patient was then returned to recovery room in stable condition.

## 2019-09-24 NOTE — Progress Notes (Signed)
Main lab called stated PT hemolyzed. Notified Dr. Danielle Dess, stated not to repeat lab.

## 2019-09-24 NOTE — Progress Notes (Signed)
Patient ID: Mark Gonzalez., male   DOB: [birth year], 60 y.o.   MRN: [id number] Vital signs are stable Motor function is intact Slight weakness in tibialis anterior on the left is noted Dressings clean and dry.

## 2019-09-24 NOTE — Progress Notes (Signed)
Orthopedic Tech Progress Note Patient Details:  Mark Gonzalez. Jul 05, 1961 [id number] I applied an ASPEN LUMBAR BRACE to patient while he was up in the chair.. Patient ID: Mark Alvine., male   DOB: Dec 25, 1960, 59 y.o.   MRN: [id number]   [name] 09/24/2019, 6:18 PM

## 2019-09-24 NOTE — Anesthesia Procedure Notes (Signed)
Procedure Name: Intubation Date/Time: 09/24/2019 9:06 AM Performed by: Candis Shine, CRNA Pre-anesthesia Checklist: Patient identified, Emergency Drugs available, Suction available and Patient being monitored Patient Re-evaluated:Patient Re-evaluated prior to induction Oxygen Delivery Method: Circle System Utilized Preoxygenation: Pre-oxygenation with 100% oxygen Induction Type: IV induction Laryngoscope Size: Mac and 4 Grade View: Grade I Tube type: Oral Tube size: 7.5 mm Number of attempts: 1 Airway Equipment and Method: Stylet Placement Confirmation: ETT inserted through vocal cords under direct vision,  positive ETCO2 and breath sounds checked- equal and bilateral Secured at: 22 cm Tube secured with: Tape Dental Injury: Teeth and Oropharynx as per pre-operative assessment

## 2019-09-25 ENCOUNTER — Inpatient Hospital Stay (HOSPITAL_COMMUNITY): Payer: BC Managed Care – PPO

## 2019-09-25 DIAGNOSIS — M21372 Foot drop, left foot: Secondary | ICD-10-CM | POA: Diagnosis not present

## 2019-09-25 LAB — BASIC METABOLIC PANEL
Anion gap: 10 (ref 5–15)
BUN: 19 mg/dL (ref 6–20)
CO2: 23 mmol/L (ref 22–32)
Calcium: 8.8 mg/dL — ABNORMAL LOW (ref 8.9–10.3)
Chloride: 102 mmol/L (ref 98–111)
Creatinine, Ser: 0.97 mg/dL (ref 0.61–1.24)
GFR calc Af Amer: >60 mL/min (ref >60)
GFR calc non Af Amer: >60 mL/min (ref >60)
Glucose, Bld: 142 mg/dL — ABNORMAL HIGH (ref 70–99)
Potassium: 4.3 mmol/L (ref 3.5–5.1)
Sodium: 135 mmol/L (ref 135–145)

## 2019-09-25 LAB — CBC
HCT: 39.1 % (ref 39.0–52.0)
Hemoglobin: 13.9 g/dL (ref 13.0–17.0)
MCH: 32.6 pg (ref 26.0–34.0)
MCHC: 35.5 g/dL (ref 30.0–36.0)
MCV: 91.6 fL (ref 80.0–100.0)
Platelets: 130 10*3/uL — ABNORMAL LOW (ref 150–400)
RBC: 4.27 MIL/uL (ref 4.22–5.81)
RDW: 11.9 % (ref 11.5–15.5)
WBC: 6.5 10*3/uL (ref 4.0–10.5)
nRBC: 0 % (ref 0.0–0.2)

## 2019-09-25 MED FILL — Thrombin For Soln 5000 Unit: CUTANEOUS | Qty: 5000 | Status: AC

## 2019-09-25 NOTE — Anesthesia Postprocedure Evaluation (Signed)
Anesthesia Post Note  Patient: Mark Gonzalez.  Procedure(s) Performed: Right Lumbar Three-Four Lumbar Four-Five Anterolateral decompression/fusion/percutaneous pedicle screw fixation (Right Spine Lumbar) Introlateral Decompression Lumbar Three-Four with XLIFT Spacer, Arthrodesis with Allograft, Posterior Fixation Lumbar Three-Five with Percutaneous screws with Robotic Assistance. Laminectomy Lumbar Four-Five Posterior Lumbar Interbody Arthrodesis Unilateral Lumbar four-Five On Left, Posterior Lateral Arthrodesis Lumbar Four-Five Bilaterally. (N/A Spine Lumbar) APPLICATION OF ROBOTIC ASSISTANCE FOR SPINAL PROCEDURE (N/A )     Patient location during evaluation: PACU Anesthesia Type: General Level of consciousness: awake and alert Pain management: pain level controlled Vital Signs Assessment: post-procedure vital signs reviewed and stable Respiratory status: spontaneous breathing, nonlabored ventilation, respiratory function stable and patient connected to nasal cannula oxygen Cardiovascular status: blood pressure returned to baseline and stable Postop Assessment: no apparent nausea or vomiting Anesthetic complications: no    Last Vitals:  Vitals:   09/25/19 1530 09/25/19 1922  BP: 103/64 (!) 110/57  Pulse: 63 70  Resp: 18 20  Temp: 36.8 C 37.2 C  SpO2: 100% 100%    Last Pain:  Vitals:   09/25/19 1922  TempSrc: Oral  PainSc:                  [name]

## 2019-09-25 NOTE — Progress Notes (Addendum)
Patient ID: Mark Gonzalez., male   DOB: 1960/12/31, 59 y.o.   MRN: [id number] Vital signs are stable Motor function is good save for left tibialis anterior which demonstrates a moderate foot drop Patient has not been aware of this until pointed out to him today Nurses had noted while he is ambulating He denies any pain in the lower extremities and he notes that this is been a big improvement Back has some moderate soreness I will order a CT today to evaluate the postoperative changes Uncertain why foot drop would have developed Continue PT and will maintain in hospital today for better pain control and continued PT and evaluation of surgical site Postoperative labs are stable save for glucose which is slightly elevated at 140

## 2019-09-25 NOTE — Evaluation (Signed)
Physical Therapy Evaluation Patient Details Name: Mark Gonzalez. MRN: [id number] DOB: 05-Mar-1961 Today's Date: 09/25/2019   History of Present Illness  Pt is 59 y/o male s/p L4-5 Anterolateral decompression L3-L4 with placement of XLIF spacer and arthrodesis using allograft. No significant PMH On file.  Clinical Impression  Pt admitted with above diagnosis. At the time of PT eval, pt was able to demonstrate transfers and ambulation with gross supervision for safety to min guard assist with RW for support. Pt was educated on precautions, brace application/wearing schedule, appropriate activity progression, and car transfer. Pt currently with functional limitations due to the deficits listed below (see PT Problem List). Pt will benefit from skilled PT to increase their independence and safety with mobility to allow discharge to the venue listed below.      Follow Up Recommendations No PT follow up    Equipment Recommendations  None recommended by PT    Recommendations for Other Services       Precautions / Restrictions Precautions Precautions: Fall;Back Precaution Booklet Issued: Yes (comment) Precaution Comments: reviewed in context of BADL Required Braces or Orthoses: Spinal Brace Spinal Brace: Lumbar corset;Applied in sitting position Restrictions Weight Bearing Restrictions: No Other Position/Activity Restrictions: okay to remove in bed; okay to remove in shower      Mobility  Bed Mobility               General bed mobility comments: up in chair  Transfers Overall transfer level: Needs assistance Equipment used: Rolling walker (2 wheeled) Transfers: Sit to/from Stand Sit to Stand: Min guard         General transfer comment: min guard for increased time and safety  Ambulation/Gait Ambulation/Gait assistance: Supervision Gait Distance (Feet): 350 Feet Assistive device: Rolling walker (2 wheeled) Gait Pattern/deviations: Step-through pattern;Decreased  stride length;Trunk flexed;Decreased dorsiflexion - left Gait velocity: Decreased Gait velocity interpretation: <1.8 ft/sec, indicate of risk for recurrent falls General Gait Details: VC's for improved posture and closer walker proximity. Pt ambulating fairly well however noted ER and foot drop on the LLE with swing through.   Stairs Stairs: Yes Stairs assistance: Min guard;Supervision Stair Management: One rail Right;Step to pattern;Forwards Number of Stairs: 10 General stair comments: VC's for sequencing and general safety with stair negotiation. Progressed to supervision for safety by end of stair training.   Wheelchair Mobility    Modified Rankin (Stroke Patients Only)       Balance Overall balance assessment: Mild deficits observed, not formally tested                                           Pertinent Vitals/Pain Pain Assessment: Faces Pain Score: 2  Faces Pain Scale: Hurts little more Pain Location: lumbar sx site Pain Descriptors / Indicators: Aching Pain Intervention(s): Limited activity within patient's tolerance;Monitored during session;Premedicated before session;Repositioned    Home Living Family/patient expects to be discharged to:: Private residence Living Arrangements: Spouse/significant other Available Help at Discharge: Family;Available 24 hours/day Type of Home: House Home Access: Stairs to enter Entrance Stairs-Rails: None Entrance Stairs-Number of Steps: 2 Home Layout: Multi-level;Bed/bath upstairs Home Equipment: Grab bars - toilet;Shower seat;Adaptive equipment Additional Comments: has shower seat in one shower, grab bars on every toilet, plans to get removeable shower head and toilet aide    Prior Function Level of Independence: Independent  Hand Dominance        Extremity/Trunk Assessment   Upper Extremity Assessment Upper Extremity Assessment: Overall WFL for tasks assessed    Lower Extremity  Assessment Lower Extremity Assessment: Defer to PT evaluation RLE Deficits / Details: Decreased strength and AROM consistent with pre-op diagnosis LLE Deficits / Details: Noted some foot drop and ER on the L, and equal weakness grossly consistent with pre-op diagnosis    Cervical / Trunk Assessment Cervical / Trunk Assessment: Other exceptions Cervical / Trunk Exceptions: s/ps urgery  Communication   Communication: No difficulties  Cognition Arousal/Alertness: Awake/alert Behavior During Therapy: WFL for tasks assessed/performed Overall Cognitive Status: Within Functional Limits for tasks assessed                                        General Comments      Exercises     Assessment/Plan    PT Assessment Patient needs continued PT services  PT Problem List Decreased strength;Decreased activity tolerance;Decreased balance;Decreased mobility;Decreased knowledge of use of DME;Decreased safety awareness;Decreased knowledge of precautions;Pain       PT Treatment Interventions DME instruction;Gait training;Functional mobility training;Therapeutic activities;Stair training;Therapeutic exercise;Neuromuscular re-education;Patient/family education    PT Goals (Current goals can be found in the Care Plan section)  Acute Rehab PT Goals Patient Stated Goal: reduce pain, regain independence PT Goal Formulation: With patient Time For Goal Achievement: 10/02/19 Potential to Achieve Goals: Good    Frequency Min 5X/week   Barriers to discharge        Co-evaluation               AM-PAC PT "6 Clicks" Mobility  Outcome Measure Help needed turning from your back to your side while in a flat bed without using bedrails?: None Help needed moving from lying on your back to sitting on the side of a flat bed without using bedrails?: None Help needed moving to and from a bed to a chair (including a wheelchair)?: A Little Help needed standing up from a chair using your arms  (e.g., wheelchair or bedside chair)?: A Little Help needed to walk in hospital room?: A Little Help needed climbing 3-5 steps with a railing? : A Little 6 Click Score: 20    End of Session Equipment Utilized During Treatment: Gait belt;Back brace Activity Tolerance: Patient tolerated treatment well Patient left: in chair;with call bell/phone within reach Nurse Communication: Mobility status PT Visit Diagnosis: Pain;Other symptoms and signs involving the nervous system (R29.898) Pain - part of body: (incision site)    Time: 0973-5329 PT Time Calculation (min) (ACUTE ONLY): 35 min   Charges:   PT Evaluation $PT Eval Moderate Complexity: 1 Mod PT Treatments $Gait Training: 8-22 mins        Conni Slipper, PT, DPT Acute Rehabilitation Services Pager: [phone] Office: [phone]   Marylynn Pearson 09/25/2019, 11:37 AM

## 2019-09-25 NOTE — Evaluation (Signed)
Occupational Therapy Evaluation Patient Details Name: Mark Gonzalez. MRN: [id number] DOB: 03-Mar-1961 Today's Date: 09/25/2019    History of Present Illness Pt is 59 y/o male s/p L4-5 Anterolateral decompression L3-L4 with placement of XLIF spacer and arthrodesis using allograft. No significant PMH On file.   Clinical Impression   PTA pt independent, living with wife and working as [employer]. At time of eval, pt completing transfers at min guard level with RW. Back handout provided and reviewed adls in detail. Pt educated on: clothing between brace, never sleep in brace, avoid sitting for long periods of time, correct bed positioning for sleeping, correct sequence for bed mobility, avoiding lifting more than 5 pounds and never wash directly over incision. Also provided education on safe bathing- pt planning to get hand held shower. Educated on use of toilet aide because pt endorses increased difficulty with peri hygiene, with long term solution being bidet installation in the home. All education is complete and patient indicates understanding. No further OT needs needed, OT Will sign off. Thank you for this consult.     Follow Up Recommendations  No OT follow up;Supervision/Assistance - 24 hour    Equipment Recommendations  None recommended by OT    Recommendations for Other Services       Precautions / Restrictions Precautions Precautions: Fall;Back Precaution Booklet Issued: Yes (comment) Precaution Comments: reviewed in context of BADL Required Braces or Orthoses: Spinal Brace Spinal Brace: Lumbar corset;Applied in sitting position Restrictions Weight Bearing Restrictions: No Other Position/Activity Restrictions: okay to remove in bed; okay to remove in shower      Mobility Bed Mobility               General bed mobility comments: up in chair  Transfers Overall transfer level: Needs assistance Equipment used: Rolling walker (2 wheeled) Transfers: Sit  to/from Stand Sit to Stand: Min guard         General transfer comment: min guard for increased time and safety    Balance Overall balance assessment: Mild deficits observed, not formally tested                                         ADL either performed or assessed with clinical judgement   ADL                                         General ADL Comments: Pt is mod I for BADLs at this time. He does have some difficulty with dressing RLE due to weakness, but is proficient in demosntrating ability to use reacher to compensate and how to practice leg crossing and heel slides to gain strength. Pt also proficient in completing toilet transfer and shower transfer at mod I level. Reviewed use of toilet aide for peri hygiene     Vision Patient Visual Report: No change from baseline       Perception     Praxis      Pertinent Vitals/Pain Pain Assessment: Faces Faces Pain Scale: Hurts little more Pain Location: lumbar sx site Pain Descriptors / Indicators: Aching Pain Intervention(s): Limited activity within patient's tolerance;Monitored during session;Premedicated before session;Repositioned     Hand Dominance     Extremity/Trunk Assessment Upper Extremity Assessment Upper Extremity Assessment: Overall WFL for tasks assessed   Lower Extremity  Assessment Lower Extremity Assessment: Defer to PT evaluation       Communication Communication Communication: No difficulties   Cognition Arousal/Alertness: Awake/alert Behavior During Therapy: WFL for tasks assessed/performed Overall Cognitive Status: Within Functional Limits for tasks assessed                                     General Comments       Exercises     Shoulder Instructions      Home Living Family/patient expects to be discharged to:: Private residence Living Arrangements: Spouse/significant other Available Help at Discharge: Family;Available 24  hours/day Type of Home: House Home Access: Stairs to enter [employer] of Steps: 2 Entrance Stairs-Rails: None Home Layout: Multi-level;Bed/bath upstairs Alternate Level Stairs-Number of Steps: 7-8 up to master   Bathroom Shower/Tub: Occupational psychologist: Handicapped height Bathroom Accessibility: Yes How Accessible: Accessible via walker Home Equipment: Grab bars - toilet;Shower seat;Adaptive equipment Adaptive Equipment: Reacher Additional Comments: has shower seat in one shower, grab bars on every toilet, plans to get removeable shower head and toilet aide      Prior Functioning/Environment Level of Independence: Independent                 OT Problem List: Decreased knowledge of use of DME or AE;Decreased activity tolerance;Decreased knowledge of precautions;Impaired balance (sitting and/or standing);Pain      OT Treatment/Interventions:      OT Goals(Current goals can be found in the care plan section) Acute Rehab OT Goals Patient Stated Goal: reduce pain, regain independence OT Goal Formulation: With patient Time For Goal Achievement: 10/09/19 Potential to Achieve Goals: Good  OT Frequency:     Barriers to D/C:            Co-evaluation              AM-PAC OT "6 Clicks" Daily Activity     Outcome Measure Help from another person eating meals?: None Help from another person taking care of personal grooming?: None Help from another person toileting, which includes using toliet, bedpan, or urinal?: None Help from another person bathing (including washing, rinsing, drying)?: None Help from another person to put on and taking off regular upper body clothing?: None Help from another person to put on and taking off regular lower body clothing?: None 6 Click Score: 24   End of Session Equipment Utilized During Treatment: Gait belt;Rolling walker;Back brace Nurse Communication: Mobility status;Precautions  Activity Tolerance: Patient  tolerated treatment well Patient left: in chair;with call bell/phone within reach  OT Visit Diagnosis: Unsteadiness on feet (R26.81);Other abnormalities of gait and mobility (R26.89);Pain Pain - part of body: (back)                Time: 2694-8546 OT Time Calculation (min): 35 min Charges:  OT General Charges $OT Visit: 1 Visit OT Evaluation $OT Eval Low Complexity: 1 Low OT Treatments $Self Care/Home Management : 8-22 mins  Zenovia Jarred, MSOT, OTR/L Acute Rehabilitation Services [facility] Office Number: [phone] Pager: [phone]  Zenovia Jarred 09/25/2019, 10:22 AM

## 2019-09-26 ENCOUNTER — Encounter: Payer: Self-pay | Admitting: *Deleted

## 2019-09-26 MED ORDER — ACETAMINOPHEN 325 MG PO TABS: 650.0000 mg | ORAL_TABLET | ORAL | Status: DC | PRN

## 2019-09-26 MED ORDER — DEXAMETHASONE 4 MG PO TABS
2.0000 mg | ORAL_TABLET | Freq: Two times a day (BID) | ORAL | Status: DC
  Administered 2019-09-26 – 2019-09-27 (×3): 2 mg via ORAL
  Filled 2019-09-26 (×3): qty 1

## 2019-09-26 MED ORDER — OXYCODONE HCL 5 MG PO TABS
5.0000 mg | ORAL_TABLET | ORAL | Status: DC | PRN
  Administered 2019-09-27 (×4): 10 mg via ORAL
  Filled 2019-09-26 (×5): qty 2

## 2019-09-26 MED ORDER — APIXABAN 5 MG PO TABS
5.0000 mg | ORAL_TABLET | Freq: Two times a day (BID) | ORAL | Status: DC
  Administered 2019-09-26 – 2019-09-27 (×3): 5 mg via ORAL
  Filled 2019-09-26 (×3): qty 1

## 2019-09-26 MED ORDER — MAGNESIUM HYDROXIDE 400 MG/5ML PO SUSP
30.0000 mL | Freq: Every day | ORAL | Status: DC
  Administered 2019-09-26 – 2019-09-27 (×2): 30 mL via ORAL
  Filled 2019-09-26 (×2): qty 30

## 2019-09-26 NOTE — Progress Notes (Signed)
Physical Therapy Treatment Patient Details Name: Mark Gonzalez. MRN: [id number] DOB: 1961-05-31 Today's Date: 09/26/2019    History of Present Illness Pt is 59 y/o male s/p L4-5 Anterolateral decompression L3-L4 with placement of XLIF spacer and arthrodesis using allograft. No significant PMH On file.    PT Comments    Pt progressing towards physical therapy goals. Reports increased pain this session and overall was moving very slowly. Pt's wife present and very supportive. We addressed precautions, brace application/wearing schedule, positioning recommendations, car transfer, and activity progression. Will continue to follow and progress as able per POC.    Follow Up Recommendations  No PT follow up     Equipment Recommendations  None recommended by PT    Recommendations for Other Services       Precautions / Restrictions Precautions Precautions: Fall;Back Precaution Booklet Issued: Yes (comment) Precaution Comments: Reviewed verbally during functional mobility Required Braces or Orthoses: Spinal Brace Spinal Brace: Lumbar corset;Applied in sitting position Restrictions Weight Bearing Restrictions: No    Mobility  Bed Mobility Overal bed mobility: Needs Assistance             General bed mobility comments: Attempted sit>sidelying without success due to pain. Attempted with HOB elevated and pt unable to tolerate. Has been sleeping in the recliner however goal is to get back to the bed at home.   Transfers Overall transfer level: Needs assistance Equipment used: Rolling walker (2 wheeled) Transfers: Sit to/from Stand Sit to Stand: Min guard         General transfer comment: Min guard for increased time and safety. Limited by pain.   Ambulation/Gait Ambulation/Gait assistance: Supervision Gait Distance (Feet): 350 Feet Assistive device: Rolling walker (2 wheeled) Gait Pattern/deviations: Step-through pattern;Decreased stride length;Trunk flexed;Decreased  dorsiflexion - left Gait velocity: Decreased Gait velocity interpretation: <1.8 ft/sec, indicate of risk for recurrent falls General Gait Details: VC's for improved posture and closer walker proximity. Pt ambulating fairly well however noted ER and foot drop on the LLE with swing through.    Stairs Stairs: Yes Stairs assistance: Min guard;Supervision Stair Management: One rail Right;Step to pattern;Forwards Number of Stairs: 10 General stair comments: VC's for sequencing and general safety with stair negotiation. Progressed to supervision for safety by end of stair training.    Wheelchair Mobility    Modified Rankin (Stroke Patients Only)       Balance Overall balance assessment: Mild deficits observed, not formally tested                                          Cognition Arousal/Alertness: Awake/alert Behavior During Therapy: WFL for tasks assessed/performed Overall Cognitive Status: Within Functional Limits for tasks assessed                                        Exercises      General Comments        Pertinent Vitals/Pain Pain Assessment: Faces Faces Pain Scale: Hurts even more Pain Location: incision site Pain Descriptors / Indicators: Operative site guarding;Discomfort Pain Intervention(s): Limited activity within patient's tolerance;Monitored during session;Repositioned    Home Living Family/patient expects to be discharged to:: Private residence Living Arrangements: Spouse/significant other Available Help at Discharge: Family;Available 24 hours/day Type of Home: House Home Access: Stairs to enter Entrance Stairs-Rails: None  Home Layout: Multi-level;Bed/bath upstairs Home Equipment: Grab bars - toilet;Shower seat;Adaptive equipment Additional Comments: has shower seat in one shower, grab bars on every toilet, plans to get removeable shower head and toilet aide    Prior Function Level of Independence: Independent           PT Goals (current goals can now be found in the care plan section) Acute Rehab PT Goals Patient Stated Goal: Be able to sleep in his bed at home PT Goal Formulation: With patient Time For Goal Achievement: 10/02/19 Potential to Achieve Goals: Good Progress towards PT goals: Progressing toward goals    Frequency    Min 5X/week      PT Plan Current plan remains appropriate    Co-evaluation              AM-PAC PT "6 Clicks" Mobility   Outcome Measure  Help needed turning from your back to your side while in a flat bed without using bedrails?: A Little Help needed moving from lying on your back to sitting on the side of a flat bed without using bedrails?: A Lot Help needed moving to and from a bed to a chair (including a wheelchair)?: A Little Help needed standing up from a chair using your arms (e.g., wheelchair or bedside chair)?: A Little Help needed to walk in hospital room?: A Little Help needed climbing 3-5 steps with a railing? : A Little 6 Click Score: 17    End of Session Equipment Utilized During Treatment: Gait belt;Back brace Activity Tolerance: Patient tolerated treatment well Patient left: in chair;with call bell/phone within reach;with family/visitor present Nurse Communication: Mobility status PT Visit Diagnosis: Pain;Other symptoms and signs involving the nervous system (R29.898) Pain - part of body: (incision site)     Time: 0826-0906 PT Time Calculation (min) (ACUTE ONLY): 40 min  Charges:  $Gait Training: 23-37 mins $Therapeutic Activity: 8-22 mins                     Conni Slipper, PT, DPT Acute Rehabilitation Services Pager: [phone] Office: [phone]    Marylynn Pearson 09/26/2019, 9:32 AM

## 2019-09-26 NOTE — Progress Notes (Signed)
Occupational Therapy Treatment Patient Details Name: Mark Gonzalez. MRN: [id number] DOB: Sep 29, 1960 Today's Date: 09/26/2019    History of present illness Pt is 59 y/o male s/p L4-5 Anterolateral decompression L3-L4 with placement of XLIF spacer and arthrodesis using allograft. No significant PMH On file.   OT comments  Return for follow up visit as pt and family with additional questions about ADLs. Reviewed education on back precautions, brace management, LB ADLs with AE, toileting with AE, and shower transfer with seat and RW. Pt and wife verbalized understanding and very appreciative of further education. Answered all questions. Continue to recommend dc to home once medically stable and will sign off.    Follow Up Recommendations  No OT follow up;Supervision/Assistance - 24 hour    Equipment Recommendations  None recommended by OT    Recommendations for Other Services      Precautions / Restrictions Precautions Precautions: Fall;Back Precaution Booklet Issued: Yes (comment) Precaution Comments: Verbalized back precautions Required Braces or Orthoses: Spinal Brace Spinal Brace: Lumbar corset;Applied in sitting position Restrictions Weight Bearing Restrictions: No       Mobility Bed Mobility Overal bed mobility: Needs Assistance             General bed mobility comments: In recliner upon arrival  Transfers Overall transfer level: Needs assistance Equipment used: Rolling walker (2 wheeled) Transfers: Sit to/from Stand Sit to Stand: Min guard         General transfer comment: Focused on pt and family education    Balance Overall balance assessment: Mild deficits observed, not formally tested                                         ADL either performed or assessed with clinical judgement   ADL Overall ADL's : Needs assistance/impaired                                       General ADL Comments: Returned for follow up  treatment session to review education on compensatory techniques for ADLs with wife present. Pt stating he remembers "somethings" from yesterday's session. Reviewed bed mobility, brace management, LB ADLs with reacher, toileting with toilet aide, and shower transfer. Pt and pt's wife verablized understanding. Both very appreciative for more education and demonstrations.      Vision       Perception     Praxis      Cognition Arousal/Alertness: Awake/alert Behavior During Therapy: WFL for tasks assessed/performed Overall Cognitive Status: Within Functional Limits for tasks assessed                                 General Comments: Very motivated and eager to learn about compensatory techniques and AE. Decreased recall of education from yesterday's session with OT        Exercises     Shoulder Instructions       General Comments Wife present throughout    Pertinent Vitals/ Pain       Pain Assessment: Faces Faces Pain Scale: Hurts little more Pain Location: incision site Pain Descriptors / Indicators: Operative site guarding;Discomfort Pain Intervention(s): Monitored during session;Limited activity within patient's tolerance;Repositioned  Home Living Family/patient expects to be discharged to:: Private residence Living Arrangements: Spouse/significant  other Available Help at Discharge: Family;Available 24 hours/day Type of Home: House Home Access: Stairs to enter [employer] of Steps: 2 Entrance Stairs-Rails: None Home Layout: Multi-level;Bed/bath upstairs Alternate Level Stairs-Number of Steps: 7-8 up to master Alternate Level Stairs-Rails: Right Bathroom Shower/Tub: Occupational psychologist: Handicapped height Bathroom Accessibility: Yes   Home Equipment: Grab bars - toilet;Shower seat;Adaptive equipment Adaptive Equipment: Reacher Additional Comments: has shower seat in one shower, grab bars on every toilet, plans to get removeable  shower head and toilet aide      Prior Functioning/Environment Level of Independence: Independent            Frequency  Min 2X/week        Progress Toward Goals  OT Goals(current goals can now be found in the care plan section)  Progress towards OT goals: Progressing toward goals;Goals met/education completed, patient discharged from OT  Acute Rehab OT Goals Patient Stated Goal: Be able to sleep in his bed at home OT Goal Formulation: With patient Time For Goal Achievement: 10/09/19 Potential to Achieve Goals: Good ADL Goals Additional ADL Goal #1: Pt and pt's wife will verbalize safe back precautions for ADLs independently  Plan Discharge plan remains appropriate;All goals met and education completed, patient discharged from OT services    Co-evaluation                 AM-PAC OT "6 Clicks" Daily Activity     Outcome Measure   Help from another person eating meals?: None Help from another person taking care of personal grooming?: None Help from another person toileting, which includes using toliet, bedpan, or urinal?: None Help from another person bathing (including washing, rinsing, drying)?: None Help from another person to put on and taking off regular upper body clothing?: None Help from another person to put on and taking off regular lower body clothing?: None 6 Click Score: 24    End of Session Equipment Utilized During Treatment: Back brace  OT Visit Diagnosis: Unsteadiness on feet (R26.81);Other abnormalities of gait and mobility (R26.89);Pain Pain - part of body: (back)   Activity Tolerance Patient tolerated treatment well   Patient Left in chair;with call bell/phone within reach;with family/visitor present   Nurse Communication Mobility status;Precautions        Time: 1000-1016 OT Time Calculation (min): 16 min  Charges: OT General Charges $OT Visit: 1 Visit OT Treatments $Self Care/Home Management : 8-22 mins  [place],  OTR/L Acute Rehab Pager: [phone] Office: [place] 09/26/2019, 12:53 PM

## 2019-09-26 NOTE — Progress Notes (Signed)
Patient ID: Mark Gonzalez., male   DOB: 21-Dec-1960, 59 y.o.   MRN: [id number] Vital signs are stable Patient had increased pain today CT reviewed and instrumentation looks good as does decompression Motor function remains unchanged with mild left TA weakness Will add decadron, MOM to stimulate bowels Continue to mobilize  Hopeful for discharge soon.

## 2019-09-27 MED ORDER — METHOCARBAMOL 500 MG PO TABS: 500.0000 mg | ORAL_TABLET | Freq: Four times a day (QID) | ORAL | 3 refills | Status: DC | PRN

## 2019-09-27 MED ORDER — OXYCODONE-ACETAMINOPHEN 5-325 MG PO TABS: 1.0000 | ORAL_TABLET | ORAL | 0 refills | Status: DC | PRN

## 2019-09-27 MED ORDER — DEXAMETHASONE 1 MG PO TABS: ORAL_TABLET | ORAL | 0 refills | Status: DC

## 2019-09-27 NOTE — Discharge Summary (Signed)
Physician Discharge Summary  Patient ID: Mark Gonzalez. MRN: [id number] DOB/AGE: [birth year] 59 y.o.  Admit date: 09/24/2019 Discharge date: 09/27/2019  Admission Diagnoses: Spondylosis and stenosis L3-4 L4-5 with neurogenic claudication, lumbar radiculopathy  Discharge Diagnoses: Myelosis and stenosis L3-4 L4-5 with neurogenic claudication and lumbar radiculopathy Active Problems:   Lumbar radiculopathy, chronic   Discharged Condition: good  Hospital Course: Patient was admitted to undergo surgical decompression arthrodesis.  He tolerated surgery well however he has had a foot drop on the left side developed postoperatively.  Is gradually been getting better follow-up studies demonstrate that his fixation and fusion is in good position.  He did have some modest issues with pain control and this is gradually come under good control.  He is discharged home  Consults: None  Significant Diagnostic Studies: None  Treatments: surgery: Anterolateral decompression L3-4 posterior decompression L4-5 posterior interbody arthrodesis L4-5 segmental fixation L3-L5.  Discharge Exam: Blood pressure 131/69, pulse 60, temperature 98 F (36.7 C), temperature source Oral, resp. rate 16, height 6' (1.829 m), weight (!) 153 kg, SpO2 97 %. Incision is clean and dry motor function is intact in the right lower extremity with some modest weakness in the iliopsoas on the right side tibialis anterior is weak to 3 out of 5 on the left side.  Disposition: Discharge disposition: 01-Home or Self Care       Discharge Instructions    Call MD for:  redness, tenderness, or signs of infection (pain, swelling, redness, odor or green/yellow discharge around incision site)   Complete by: As directed    Call MD for:  severe uncontrolled pain   Complete by: As directed    Call MD for:  temperature >100.4   Complete by: As directed    Diet - low sodium heart healthy   Complete by: As directed    Discharge  instructions   Complete by: As directed    Okay to shower. Do not apply salves or appointments to incision. No heavy lifting with the upper extremities greater than 15 pounds. May resume driving when not requiring pain medication and patient feels comfortable with doing so.   Incentive spirometry RT   Complete by: As directed    Increase activity slowly   Complete by: As directed      Allergies as of 09/27/2019      Reactions   Lisinopril Cough      Medication List    TAKE these medications   apixaban 5 MG Tabs tablet Commonly known as: Eliquis TAKE 1 TABLET(5 MG) BY MOUTH TWICE DAILY What changed:   how much to take  how to take this  when to take this  additional instructions   b complex vitamins tablet Take 1 tablet by mouth daily.   dexamethasone 1 MG tablet Commonly known as: DECADRON 2 tablets twice daily for 2 days, one tablet twice daily for 2 days, one tablet daily for 2 days.   diclofenac 75 MG EC tablet Commonly known as: VOLTAREN Take 75 mg by mouth daily.   dofetilide 500 MCG capsule Commonly known as: TIKOSYN Take 1 capsule (500 mcg total) by mouth 2 (two) times daily.   Fish Oil 1200 MG Caps Take 1,200 mg by mouth in the morning and at bedtime.   fluticasone 50 MCG/ACT nasal spray Commonly known as: FLONASE Place 2 sprays into both nostrils daily.   furosemide 20 MG tablet Commonly known as: LASIX Take 40 mg by mouth 2 (two) times a week. Every  Monday and Thursday   irbesartan 150 MG tablet Commonly known as: AVAPRO Take 1 tablet (150 mg total) by mouth daily. What changed: when to take this   loratadine 10 MG tablet Commonly known as: CLARITIN Take 10 mg by mouth daily.   lovastatin 20 MG tablet Commonly known as: MEVACOR TAKE 1 TABLET(20 MG) BY MOUTH AT BEDTIME What changed:   how much to take  how to take this  when to take this   Magnesium 400 MG Caps Take 400 mg by mouth every evening.   methocarbamol 500 MG  tablet Commonly known as: ROBAXIN Take 1 tablet (500 mg total) by mouth every 6 (six) hours as needed for muscle spasms.   Milk Thistle 250 MG Caps Take 250 mg by mouth every evening.   multivitamin with minerals Tabs tablet Take 1 tablet by mouth daily.   oxyCODONE-acetaminophen 5-325 MG tablet Commonly known as: PERCOCET/ROXICET Take 1-2 tablets by mouth every 4 (four) hours as needed for moderate pain or severe pain.   potassium chloride SA 20 MEQ tablet Commonly known as: KLOR-CON Take 2 tablets (40 mEq total) by mouth 2 (two) times a week. Every Monday and Thursday   PRESERVISION AREDS 2 PO Take 1 tablet by mouth in the morning and at bedtime.   PROBIOTIC PO Take 1 capsule by mouth daily.   Simponi Aria 50 MG/4ML Soln injection Generic drug: golimumab Inject into the vein every 8 (eight) weeks.   traMADol 50 MG tablet Commonly known as: ULTRAM Take 50 mg by mouth every 6 (six) hours as needed (for pain.).   Vitamin D-3 125 MCG (5000 UT) Tabs Take 5,000 Units by mouth daily.        Signed: Shary Key Sandeep Radell 09/27/2019, 9:08 PM

## 2019-09-27 NOTE — Progress Notes (Signed)
Pt was visited by Dr. Danielle Dess.  Pt A/Ox4. Pt has verbalized that his pain is well-controlled.  No complaints voiced.  Family member at bedside.  Discharge instructions provided to pt and family.

## 2019-09-27 NOTE — Progress Notes (Signed)
Physical Therapy Treatment Patient Details Name: Mark Gonzalez. MRN: [id number] DOB: [birth year] Today's Date: 09/27/2019    History of Present Illness Pt is 59 y/o male s/p L4-5 Anterolateral decompression L3-L4 with placement of XLIF spacer and arthrodesis using allograft. No significant PMH On file.    PT Comments    Pt progressing towards physical therapy goals. He was able to demonstrate transfers and ambulation with gross supervision for safety to modified independence. Pt did require assist for LE elevation back into bed but otherwise did not require assist at all throughout session. Pt was educated on precautions, brace application/wearing schedule, appropriate activity progression, and car transfer. Will continue to follow.      Follow Up Recommendations  No PT follow up;Supervision - Intermittent     Equipment Recommendations  None recommended by PT    Recommendations for Other Services       Precautions / Restrictions Precautions Precautions: Fall;Back Precaution Booklet Issued: Yes (comment) Precaution Comments: Verbalized back precautions Required Braces or Orthoses: Spinal Brace Spinal Brace: Lumbar corset;Applied in sitting position Restrictions Weight Bearing Restrictions: No    Mobility  Bed Mobility Overal bed mobility: Needs Assistance Bed Mobility: Rolling;Sidelying to Sit;Sit to Sidelying Rolling: Supervision Sidelying to sit: Supervision     Sit to sidelying: Min assist General bed mobility comments: Assist to transition LE's up into bed but able to perform sidelying to sit without assist. HOB flat and rails lowered to simulate home environment.   Transfers Overall transfer level: Needs assistance Equipment used: Rolling walker (2 wheeled) Transfers: Sit to/from Stand Sit to Stand: Supervision         General transfer comment: No assist required. Pt demonstrating good hand placement on seated surface for safety.    Ambulation/Gait Ambulation/Gait assistance: Modified independent (Device/Increase time) Gait Distance (Feet): 350 Feet Assistive device: Rolling walker (2 wheeled) Gait Pattern/deviations: Step-through pattern;Decreased stride length;Trunk flexed;Decreased dorsiflexion - left Gait velocity: Decreased Gait velocity interpretation: 1.31 - 2.62 ft/sec, indicative of limited community ambulator General Gait Details: VC's for improved posture and closer walker proximity. Pt ambulating fairly well however noted ER and foot drop on the LLE with swing through.    Stairs Stairs: Yes Stairs assistance: Supervision Stair Management: One rail Right;Step to pattern;Forwards Number of Stairs: 10 General stair comments: VC's for sequencing and general safety with stair negotiation. Progressed to supervision for safety by end of stair training.    Wheelchair Mobility    Modified Rankin (Stroke Patients Only)       Balance Overall balance assessment: Mild deficits observed, not formally tested                                          Cognition Arousal/Alertness: Awake/alert Behavior During Therapy: WFL for tasks assessed/performed Overall Cognitive Status: Within Functional Limits for tasks assessed                                        Exercises      General Comments        Pertinent Vitals/Pain Pain Assessment: Faces Faces Pain Scale: Hurts a little bit Pain Location: incision site Pain Descriptors / Indicators: Operative site guarding;Discomfort Pain Intervention(s): Limited activity within patient's tolerance;Monitored during session;Repositioned    Home Living  Prior Function            PT Goals (current goals can now be found in the care plan section) Acute Rehab PT Goals Patient Stated Goal: Be able to sleep in his bed at home PT Goal Formulation: With patient Time For Goal Achievement:  10/02/19 Potential to Achieve Goals: Good Progress towards PT goals: Progressing toward goals    Frequency    Min 5X/week      PT Plan Current plan remains appropriate    Co-evaluation              AM-PAC PT "6 Clicks" Mobility   Outcome Measure  Help needed turning from your back to your side while in a flat bed without using bedrails?: A Little Help needed moving from lying on your back to sitting on the side of a flat bed without using bedrails?: A Lot Help needed moving to and from a bed to a chair (including a wheelchair)?: A Little Help needed standing up from a chair using your arms (e.g., wheelchair or bedside chair)?: A Little Help needed to walk in hospital room?: A Little Help needed climbing 3-5 steps with a railing? : A Little 6 Click Score: 17    End of Session Equipment Utilized During Treatment: Gait belt;Back brace Activity Tolerance: Patient tolerated treatment well Patient left: in chair;with call bell/phone within reach;with family/visitor present Nurse Communication: Mobility status PT Visit Diagnosis: Pain;Other symptoms and signs involving the nervous system (R29.898) Pain - part of body: (incision site)     Time: 4332-9518 PT Time Calculation (min) (ACUTE ONLY): 30 min  Charges:  $Gait Training: 23-37 mins                     Rolinda Roan, PT, DPT Acute Rehabilitation Services Pager: [phone] Office: [phone]    Thelma Comp 09/27/2019, 12:27 PM

## 2019-09-27 NOTE — Discharge Instructions (Signed)
Wound Care Leave incision open to air. You may shower. Do not scrub directly on incision.  Do not put any creams, lotions, or ointments on incision. Activity Walk each and every day, increasing distance each day. No lifting greater than 5 lbs.  Avoid excessive neck motion. No driving for 2 weeks; may ride as a passenger locally. Diet Resume your normal diet.  Return to Work Will be discussed at you follow up appointment. Call Your Doctor If Any of These Occur Redness, drainage, or swelling at the wound.  Temperature greater than 101 degrees. Severe pain not relieved by pain medication. Increased difficulty swallowing. Incision starts to come apart. Follow Up Appt Call today for appointment in 2-4 weeks ([phone]) or for problems.  If you have any hardware placed in your spine, you will need an x-ray before your appointment.     Information on my medicine - ELIQUIS (apixaban)  Why was Eliquis prescribed for you? Eliquis was prescribed for you to reduce the risk of a blood clot forming that can cause a stroke if you have a medical condition called atrial fibrillation (a type of irregular heartbeat).  What do You need to know about Eliquis ? Take your Eliquis TWICE DAILY - one tablet in the morning and one tablet in the evening with or without food. If you have difficulty swallowing the tablet whole please discuss with your pharmacist how to take the medication safely.  Take Eliquis exactly as prescribed by your doctor and DO NOT stop taking Eliquis without talking to the doctor who prescribed the medication.  Stopping may increase your risk of developing a stroke.  Refill your prescription before you run out.  After discharge, you should have regular check-up appointments with your healthcare provider that is prescribing your Eliquis.  In the future your dose may need to be changed if your kidney function or weight changes by a significant amount or as you get older.  What  do you do if you miss a dose? If you miss a dose, take it as soon as you remember on the same day and resume taking twice daily.  Do not take more than one dose of ELIQUIS at the same time to make up a missed dose.  Important Safety Information A possible side effect of Eliquis is bleeding. You should call your healthcare provider right away if you experience any of the following: ? Bleeding from an injury or your nose that does not stop. ? Unusual colored urine (red or dark brown) or unusual colored stools (red or black). ? Unusual bruising for unknown reasons. ? A serious fall or if you hit your head (even if there is no bleeding).  Some medicines may interact with Eliquis and might increase your risk of bleeding or clotting while on Eliquis. To help avoid this, consult your healthcare provider or pharmacist prior to using any new prescription or non-prescription medications, including herbals, vitamins, non-steroidal anti-inflammatory drugs (NSAIDs) and supplements.  This website has more information on Eliquis (apixaban): http://www.eliquis.com/eliquis/home

## 2019-09-27 NOTE — Progress Notes (Signed)
Pt is discharged home in good condition at this time, accompanied by spouse.  Personal belongings sent with pt.

## 2019-09-30 ENCOUNTER — Encounter: Payer: Self-pay | Admitting: *Deleted

## 2019-10-14 ENCOUNTER — Other Ambulatory Visit (HOSPITAL_COMMUNITY): Payer: Self-pay | Admitting: Neurological Surgery

## 2019-10-14 ENCOUNTER — Other Ambulatory Visit: Payer: Self-pay

## 2019-10-14 ENCOUNTER — Ambulatory Visit (HOSPITAL_COMMUNITY)
Admission: RE | Admit: 2019-10-14 | Discharge: 2019-10-14 | Disposition: A | Discharge status: Home / Self Care | Payer: BC Managed Care – PPO | Source: Ambulatory Visit | Attending: Neurological Surgery | Admitting: Neurological Surgery

## 2019-10-14 DIAGNOSIS — M48062 Spinal stenosis, lumbar region with neurogenic claudication: Secondary | ICD-10-CM | POA: Diagnosis not present

## 2019-10-14 DIAGNOSIS — R52 Pain, unspecified: Secondary | ICD-10-CM

## 2019-10-17 ENCOUNTER — Ambulatory Visit: Payer: BC Managed Care – PPO

## 2019-10-17 DIAGNOSIS — L4050 Arthropathic psoriasis, unspecified: Secondary | ICD-10-CM | POA: Diagnosis not present

## 2019-10-17 DIAGNOSIS — Z79899 Other long term (current) drug therapy: Secondary | ICD-10-CM | POA: Diagnosis not present

## 2019-10-17 DIAGNOSIS — Z111 Encounter for screening for respiratory tuberculosis: Secondary | ICD-10-CM | POA: Diagnosis not present

## 2019-10-22 DIAGNOSIS — M48062 Spinal stenosis, lumbar region with neurogenic claudication: Secondary | ICD-10-CM | POA: Diagnosis not present

## 2019-10-22 DIAGNOSIS — R269 Unspecified abnormalities of gait and mobility: Secondary | ICD-10-CM | POA: Diagnosis not present

## 2019-10-24 ENCOUNTER — Other Ambulatory Visit (HOSPITAL_COMMUNITY): Payer: Self-pay | Admitting: *Deleted

## 2019-10-24 MED ORDER — POTASSIUM CHLORIDE CRYS ER 20 MEQ PO TBCR: 40.0000 meq | EXTENDED_RELEASE_TABLET | ORAL | 6 refills | Status: DC

## 2019-10-24 MED ORDER — POTASSIUM CHLORIDE CRYS ER 20 MEQ PO TBCR: 40.0000 meq | EXTENDED_RELEASE_TABLET | ORAL | 3 refills | Status: DC

## 2019-10-25 DIAGNOSIS — M48062 Spinal stenosis, lumbar region with neurogenic claudication: Secondary | ICD-10-CM | POA: Diagnosis not present

## 2019-10-25 DIAGNOSIS — R269 Unspecified abnormalities of gait and mobility: Secondary | ICD-10-CM | POA: Diagnosis not present

## 2019-10-28 ENCOUNTER — Emergency Department (HOSPITAL_COMMUNITY)
Admission: EM | Admit: 2019-10-28 | Discharge: 2019-10-28 | Disposition: A | Discharge status: Home / Self Care | Payer: BC Managed Care – PPO | Attending: Emergency Medicine | Admitting: Emergency Medicine

## 2019-10-28 ENCOUNTER — Other Ambulatory Visit: Payer: Self-pay

## 2019-10-28 ENCOUNTER — Emergency Department (HOSPITAL_COMMUNITY): Payer: BC Managed Care – PPO

## 2019-10-28 DIAGNOSIS — M545 Low back pain: Secondary | ICD-10-CM | POA: Diagnosis not present

## 2019-10-28 DIAGNOSIS — M25562 Pain in left knee: Secondary | ICD-10-CM | POA: Diagnosis not present

## 2019-10-28 DIAGNOSIS — S8992XA Unspecified injury of left lower leg, initial encounter: Secondary | ICD-10-CM | POA: Diagnosis not present

## 2019-10-28 DIAGNOSIS — M5489 Other dorsalgia: Secondary | ICD-10-CM | POA: Diagnosis not present

## 2019-10-28 DIAGNOSIS — S80211A Abrasion, right knee, initial encounter: Secondary | ICD-10-CM | POA: Diagnosis not present

## 2019-10-28 DIAGNOSIS — W1830XA Fall on same level, unspecified, initial encounter: Secondary | ICD-10-CM | POA: Insufficient documentation

## 2019-10-28 DIAGNOSIS — S50311A Abrasion of right elbow, initial encounter: Secondary | ICD-10-CM | POA: Diagnosis not present

## 2019-10-28 DIAGNOSIS — S80212A Abrasion, left knee, initial encounter: Secondary | ICD-10-CM | POA: Insufficient documentation

## 2019-10-28 DIAGNOSIS — Y999 Unspecified external cause status: Secondary | ICD-10-CM | POA: Insufficient documentation

## 2019-10-28 DIAGNOSIS — W19XXXA Unspecified fall, initial encounter: Secondary | ICD-10-CM

## 2019-10-28 DIAGNOSIS — Y92017 Garden or yard in single-family (private) house as the place of occurrence of the external cause: Secondary | ICD-10-CM | POA: Diagnosis not present

## 2019-10-28 DIAGNOSIS — Y9301 Activity, walking, marching and hiking: Secondary | ICD-10-CM | POA: Diagnosis not present

## 2019-10-28 DIAGNOSIS — R52 Pain, unspecified: Secondary | ICD-10-CM | POA: Diagnosis not present

## 2019-10-28 DIAGNOSIS — T07XXXA Unspecified multiple injuries, initial encounter: Secondary | ICD-10-CM | POA: Diagnosis not present

## 2019-10-28 DIAGNOSIS — Z9889 Other specified postprocedural states: Secondary | ICD-10-CM | POA: Diagnosis not present

## 2019-10-28 NOTE — Discharge Instructions (Addendum)
The x-rays that we performed here in the ED showed no evidence of malfunction of your hardware noted that show any fracture of your knee.  At this point, your safe to return home continue your normal physical therapy.  If you do begin to experience difficulty holding your urine or your stool or experience numbness in your pelvis, please seek help immediately.

## 2019-10-28 NOTE — ED Provider Notes (Signed)
[facility] EMERGENCY DEPARTMENT Provider Note   CSN: [id number] Arrival date & time: 10/28/19  1258     History Chief Complaint  Patient presents with  . Fall    Mark Newsham [name] is a 59 y.o. male.  Mark Gonzalez is a 59 year old man who presents to the ED today after falling to his hands and knees.  He has a previous medical history significant for recent spinal surgery on 09/24/2019 and he wants to ensure that he has not dislodged his hardware.  Mark Gonzalez reports that he has been progressing well with physical therapy and recently progressed from using a walker at to using walking poles.  He was on a walk today with his wife when he tripped over his left foot and fell forward onto his hands and knees.  He has noticed that he has had some foot drop on the left side ever since he woke up following his surgery.  After the incident, he feels some lower back tenderness and is concerned that he may have damaged the hardware put in during his recent operation.  He denies any new numbness, tingling, shooting pain.  He denies urinary or fecal incontinence.  He denies any numbness in his pelvic area.        No past medical history on file.  There are no problems to display for this patient.        No family history on file.  Social History   Tobacco Use  . Smoking status: Not on file  Substance Use Topics  . Alcohol use: Not on file  . Drug use: Not on file    Home Medications Prior to Admission medications   Not on File    Allergies    Patient has no allergy information on record.  Review of Systems   Review of Systems  Constitutional: Negative for fever.  Eyes: Negative for visual disturbance.  Musculoskeletal: Positive for myalgias.  Neurological: Negative for weakness and numbness.  All other systems reviewed and are negative.   Physical Exam Updated Vital Signs BP 132/90 (BP Location: Right Arm)   Pulse (!) 50   Temp 98.6 F (37 C) (Oral)    Resp 16   SpO2 99%   Physical Exam Constitutional:      Appearance: Normal appearance. He is obese.  HENT:     Head: Normocephalic and atraumatic.     Nose: Nose normal.     Mouth/Throat:     Mouth: Mucous membranes are moist.     Pharynx: Oropharynx is clear.  Eyes:     Extraocular Movements: Extraocular movements intact.     Conjunctiva/sclera: Conjunctivae normal.     Pupils: Pupils are equal, round, and reactive to light.  Cardiovascular:     Rate and Rhythm: Normal rate and regular rhythm.     Pulses: Normal pulses.     Heart sounds: Normal heart sounds. No murmur.  Pulmonary:     Effort: Pulmonary effort is normal. No respiratory distress.     Breath sounds: Normal breath sounds. No wheezing or rales.  Abdominal:     General: Bowel sounds are normal. There is no distension.     Palpations: Abdomen is soft.     Tenderness: There is no abdominal tenderness.  Musculoskeletal:        General: No tenderness.     Cervical back: Normal range of motion and neck supple.     Right lower leg: No edema.  Left lower leg: No edema.     Comments: Abrasions over knees bilaterally and right elbow.  No significant tenderness over knees or elbows.  5/5 strength for plantar flexion bilaterally.  3/5 strength for dorsiflexion of the left foot (this is been persistent since his surgery).  Neurological:     Mental Status: He is alert.     ED Results / Procedures / Treatments   Labs (all labs ordered are listed, but only abnormal results are displayed) Labs Reviewed - No data to display  EKG None  Radiology No results found.  Procedures Procedures (including critical care time)  Medications Ordered in ED Medications - No data to display  ED Course  I have reviewed the triage vital signs and the nursing notes.  Pertinent labs & imaging results that were available during my care of the patient were reviewed by me and considered in my medical decision making (see chart for  details).    MDM Rules/Calculators/A&P                      Mark Gonzalez is a 59 year old man who presents to the ED with concerns about his spinal hardware after a fall.  Is mild tenderness to the lumbar region on physical exam but all else is otherwise unremarkable.  We will move forward with an x-ray of the lumbar spine and knees for now.  X-ray of the lumbar spine shows no evidence of damage to the hardware or complication of previous surgery.  No evidence of knee fracture.  Final Clinical Impression(s) / ED Diagnoses Final diagnoses:  None    Rx / DC Orders ED Discharge Orders    None       Matilde Haymaker, MD 10/28/19 [place], [place], DO 10/28/19 1433

## 2019-10-28 NOTE — Progress Notes (Signed)
Orthopedic Tech Progress Note Patient Details:  Mark Gonzalez 1961-02-02 [id number] Level 2 trauma downgraded  Patient ID: [name], male   DOB: 1961/04/25, 59 y.o.   MRN: [id number]   [name] 10/28/2019, 1:29 PM

## 2019-10-28 NOTE — ED Triage Notes (Signed)
Pt here from home where he was walking in his yard, using the canes he normally does, when he tripped fell onto his hands and knees, and then lowered himself to the ground. No loc, did not hit head. Abrasion to knee. On eliquis. Recent back surgery, and patient is concerned about rods and screws being displaced.

## 2019-10-29 DIAGNOSIS — M48062 Spinal stenosis, lumbar region with neurogenic claudication: Secondary | ICD-10-CM | POA: Diagnosis not present

## 2019-10-29 DIAGNOSIS — R269 Unspecified abnormalities of gait and mobility: Secondary | ICD-10-CM | POA: Diagnosis not present

## 2019-10-31 DIAGNOSIS — R269 Unspecified abnormalities of gait and mobility: Secondary | ICD-10-CM | POA: Diagnosis not present

## 2019-10-31 DIAGNOSIS — M48062 Spinal stenosis, lumbar region with neurogenic claudication: Secondary | ICD-10-CM | POA: Diagnosis not present

## 2019-11-06 DIAGNOSIS — R269 Unspecified abnormalities of gait and mobility: Secondary | ICD-10-CM | POA: Diagnosis not present

## 2019-11-06 DIAGNOSIS — M48062 Spinal stenosis, lumbar region with neurogenic claudication: Secondary | ICD-10-CM | POA: Diagnosis not present

## 2019-11-07 DIAGNOSIS — I32 Pericarditis in diseases classified elsewhere: Secondary | ICD-10-CM | POA: Diagnosis not present

## 2019-11-07 DIAGNOSIS — Z125 Encounter for screening for malignant neoplasm of prostate: Secondary | ICD-10-CM | POA: Diagnosis not present

## 2019-11-07 DIAGNOSIS — L401 Generalized pustular psoriasis: Secondary | ICD-10-CM | POA: Diagnosis not present

## 2019-11-07 DIAGNOSIS — L03115 Cellulitis of right lower limb: Secondary | ICD-10-CM | POA: Diagnosis not present

## 2019-11-07 DIAGNOSIS — L4050 Arthropathic psoriasis, unspecified: Secondary | ICD-10-CM | POA: Diagnosis not present

## 2019-11-07 DIAGNOSIS — Z131 Encounter for screening for diabetes mellitus: Secondary | ICD-10-CM | POA: Diagnosis not present

## 2019-11-07 DIAGNOSIS — Z0000 Encounter for general adult medical examination without abnormal findings: Secondary | ICD-10-CM | POA: Diagnosis not present

## 2019-11-07 DIAGNOSIS — E7800 Pure hypercholesterolemia, unspecified: Secondary | ICD-10-CM | POA: Diagnosis not present

## 2019-11-07 DIAGNOSIS — I10 Essential (primary) hypertension: Secondary | ICD-10-CM | POA: Diagnosis not present

## 2019-11-08 DIAGNOSIS — M48062 Spinal stenosis, lumbar region with neurogenic claudication: Secondary | ICD-10-CM | POA: Diagnosis not present

## 2019-11-08 DIAGNOSIS — R269 Unspecified abnormalities of gait and mobility: Secondary | ICD-10-CM | POA: Diagnosis not present

## 2019-11-13 DIAGNOSIS — M48062 Spinal stenosis, lumbar region with neurogenic claudication: Secondary | ICD-10-CM | POA: Diagnosis not present

## 2019-11-13 DIAGNOSIS — R269 Unspecified abnormalities of gait and mobility: Secondary | ICD-10-CM | POA: Diagnosis not present

## 2019-11-15 DIAGNOSIS — M48062 Spinal stenosis, lumbar region with neurogenic claudication: Secondary | ICD-10-CM | POA: Diagnosis not present

## 2019-11-15 DIAGNOSIS — R269 Unspecified abnormalities of gait and mobility: Secondary | ICD-10-CM | POA: Diagnosis not present

## 2019-11-19 DIAGNOSIS — G5732 Lesion of lateral popliteal nerve, left lower limb: Secondary | ICD-10-CM | POA: Diagnosis not present

## 2019-11-20 DIAGNOSIS — R269 Unspecified abnormalities of gait and mobility: Secondary | ICD-10-CM | POA: Diagnosis not present

## 2019-11-20 DIAGNOSIS — M48062 Spinal stenosis, lumbar region with neurogenic claudication: Secondary | ICD-10-CM | POA: Diagnosis not present

## 2019-11-22 DIAGNOSIS — R269 Unspecified abnormalities of gait and mobility: Secondary | ICD-10-CM | POA: Diagnosis not present

## 2019-11-22 DIAGNOSIS — M48062 Spinal stenosis, lumbar region with neurogenic claudication: Secondary | ICD-10-CM | POA: Diagnosis not present

## 2019-11-26 DIAGNOSIS — R269 Unspecified abnormalities of gait and mobility: Secondary | ICD-10-CM | POA: Diagnosis not present

## 2019-11-26 DIAGNOSIS — M48062 Spinal stenosis, lumbar region with neurogenic claudication: Secondary | ICD-10-CM | POA: Diagnosis not present

## 2019-11-27 ENCOUNTER — Ambulatory Visit (HOSPITAL_COMMUNITY)
Admission: RE | Admit: 2019-11-27 | Discharge: 2019-11-27 | Disposition: A | Discharge status: Home / Self Care | Payer: BC Managed Care – PPO | Source: Ambulatory Visit | Attending: Internal Medicine | Admitting: Internal Medicine

## 2019-11-27 ENCOUNTER — Encounter (HOSPITAL_COMMUNITY): Payer: Self-pay | Admitting: Internal Medicine

## 2019-11-27 ENCOUNTER — Other Ambulatory Visit: Payer: Self-pay

## 2019-11-27 VITALS — BP 134/82 | HR 67 | Wt 342.8 lb

## 2019-11-27 DIAGNOSIS — E785 Hyperlipidemia, unspecified: Secondary | ICD-10-CM | POA: Diagnosis not present

## 2019-11-27 DIAGNOSIS — I480 Paroxysmal atrial fibrillation: Secondary | ICD-10-CM | POA: Diagnosis not present

## 2019-11-27 DIAGNOSIS — Z79899 Other long term (current) drug therapy: Secondary | ICD-10-CM | POA: Diagnosis not present

## 2019-11-27 DIAGNOSIS — Z7901 Long term (current) use of anticoagulants: Secondary | ICD-10-CM | POA: Insufficient documentation

## 2019-11-27 DIAGNOSIS — I10 Essential (primary) hypertension: Secondary | ICD-10-CM | POA: Diagnosis not present

## 2019-11-27 DIAGNOSIS — M21372 Foot drop, left foot: Secondary | ICD-10-CM | POA: Diagnosis not present

## 2019-11-27 DIAGNOSIS — G629 Polyneuropathy, unspecified: Secondary | ICD-10-CM | POA: Diagnosis not present

## 2019-11-27 DIAGNOSIS — Z6841 Body Mass Index (BMI) 40.0 and over, adult: Secondary | ICD-10-CM | POA: Insufficient documentation

## 2019-11-27 DIAGNOSIS — R600 Localized edema: Secondary | ICD-10-CM | POA: Insufficient documentation

## 2019-11-27 DIAGNOSIS — L4050 Arthropathic psoriasis, unspecified: Secondary | ICD-10-CM | POA: Diagnosis not present

## 2019-11-27 DIAGNOSIS — Z791 Long term (current) use of non-steroidal anti-inflammatories (NSAID): Secondary | ICD-10-CM | POA: Insufficient documentation

## 2019-11-27 DIAGNOSIS — Z01810 Encounter for preprocedural cardiovascular examination: Secondary | ICD-10-CM

## 2019-11-27 MED ORDER — POTASSIUM CHLORIDE CRYS ER 20 MEQ PO TBCR: 40.0000 meq | EXTENDED_RELEASE_TABLET | Freq: Every day | ORAL | 3 refills | Status: DC

## 2019-11-27 MED ORDER — FUROSEMIDE 40 MG PO TABS: 40.0000 mg | ORAL_TABLET | Freq: Every day | ORAL | 5 refills | Status: DC

## 2019-11-27 NOTE — Patient Instructions (Signed)
Please wear compression hose  A prescription have been sent to your pharmacy for the following medications  Lasix 40mg  (1 tab) daily  Potassium 40 meq (2 tabs) daily  Your physician recommends that you schedule a follow-up appointment in: 3-4 months with Dr  Please call office at [phone] option 2 if you have any questions or concerns.   At the [facility], you and your health needs are our priority. As part of our continuing mission to provide you with exceptional heart care, we have created designated Provider Care Teams. These Care Teams include your primary Cardiologist (physician) and Advanced Practice Providers (APPs- Physician Assistants and Nurse Practitioners) who all work together to provide you with the care you need, when you need it.   You may see any of the following providers on your designated Care Team at your next follow up: [phone] Dr Marland Kitchen . Dr Arvilla Meres . Marca Ancona, NP . [name], PA . Robbie Lis, PharmD   Please be sure to bring in all your medications bottles to every appointment.

## 2019-11-27 NOTE — Addendum Note (Signed)
Encounter addended by: Marisa Hua, RN on: 11/27/2019 12:15 PM  Actions taken: Clinical Note Signed, Order list changed

## 2019-11-27 NOTE — Progress Notes (Signed)
Patient ID: Mark Born., male   DOB: 1960-10-09, 59 y.o.   MRN: [id number] . Advanced Heart Failure Clinic Note   Subjective:    Mark Gonzalez is a very pleasant 59 y/o male with a history of psoriatic arthritis, obesity, hypertension, and atrial fibrillation. In 2009 he was admitted for Tikosyn load followed by successful DC-CV. He continued on 500 mcg Tikosyn bid but later went back into A fib. He was referred to [facility] for an ablation. He had AF ablation at [facility] and was maintained in NSR on flecainide but went back into A fib.  Eventually failed Flecainide with multiple recurrences of AF. We re-started Tikosyn in 06/2013. He had an early recurrence of AF in about 2 weeks. Had 4 day episode of AF around Xmas but then went back into NSR and has held for the most part  Admitted several times in March 2015 with acute pericarditis. Treated with colchicine and steroids. Had recurrence and readmitted. Echo with moderate pericardial effusion. Started on prednisone 60 daily. Effusion resolved after 1 week. Remicade stopped as it was thought that it might be causative. Has not recurred.  Previously was working with [facility] and lost a lot of weight. Was riding his bike 150-200 miles per week. On 09/24/19 had spinal surgery which went well except for LE edema. Now had developed peroneal neuropathy with left foot drop. Still struggling with LE edema. Walking 1.5-2 miles per day will Rollator for stability. No CP or SOB. Remains in NSR. On Eliquis. No bleeding. Taking lasix 40mg  Mon & Thursday has not increased.    Current Outpatient Medications on File Prior to Encounter  Medication Sig Dispense Refill  . apixaban (ELIQUIS) 5 MG TABS tablet TAKE 1 TABLET(5 MG) BY MOUTH TWICE DAILY (Patient taking differently: Take 5 mg by mouth 2 (two) times daily. ) 60 tablet 5  . b complex vitamins tablet Take 1 tablet by mouth daily.    . Cholecalciferol (VITAMIN D-3) 125 MCG (5000 UT) TABS Take 5,000 Units by mouth  daily.    . diclofenac (VOLTAREN) 75 MG EC tablet Take 75 mg by mouth daily.   0  . dofetilide (TIKOSYN) 500 MCG capsule Take 1 capsule (500 mcg total) by mouth 2 (two) times daily. 60 capsule 5  . fluticasone (FLONASE) 50 MCG/ACT nasal spray Place 2 sprays into both nostrils daily. 16 g 0  . furosemide (LASIX) 20 MG tablet Take 40 mg by mouth 2 (two) times a week. Every Monday and Thursday     . golimumab (SIMPONI ARIA) 50 MG/4ML SOLN injection Inject into the vein every 8 (eight) weeks.     . irbesartan (AVAPRO) 150 MG tablet Take 1 tablet (150 mg total) by mouth daily. (Patient taking differently: Take 150 mg by mouth every evening. ) 30 tablet 11  . loratadine (CLARITIN) 10 MG tablet Take 10 mg by mouth daily.    Marland Kitchen lovastatin (MEVACOR) 20 MG tablet TAKE 1 TABLET(20 MG) BY MOUTH AT BEDTIME (Patient taking differently: Take 20 mg by mouth at bedtime. TAKE 1 TABLET(20 MG) BY MOUTH AT BEDTIME) 30 tablet 5  . Magnesium 400 MG CAPS Take 400 mg by mouth every evening.     . methocarbamol (ROBAXIN) 500 MG tablet Take 1 tablet (500 mg total) by mouth every 6 (six) hours as needed for muscle spasms. 40 tablet 3  . Milk Thistle 250 MG CAPS Take 250 mg by mouth every evening.    . Multiple Vitamin (MULTIVITAMIN WITH  MINERALS) TABS tablet Take 1 tablet by mouth daily.    . Multiple Vitamins-Minerals (PRESERVISION AREDS 2 PO) Take 1 tablet by mouth in the morning and at bedtime.    . Omega-3 Fatty Acids (FISH OIL) 1200 MG CAPS Take 1,200 mg by mouth in the morning and at bedtime.    Marland Kitchen oxyCODONE-acetaminophen (PERCOCET/ROXICET) 5-325 MG tablet Take 1-2 tablets by mouth every 4 (four) hours as needed for moderate pain or severe pain. 60 tablet 0  . potassium chloride SA (KLOR-CON) 20 MEQ tablet Take 2 tablets (40 mEq total) by mouth 2 (two) times a week. Every Monday and Thursday 60 tablet 3  . Probiotic Product (PROBIOTIC PO) Take 1 capsule by mouth daily.     . traMADol (ULTRAM) 50 MG tablet Take 50 mg by  mouth every 6 (six) hours as needed (for pain.).     No current facility-administered medications on file prior to encounter.    Objective:       Weight change: Vitals:   11/27/19 1110  BP: 134/82  Pulse: 67  SpO2: 97%  Weight: (!) 155.5 kg (342 lb 12.8 oz)   Filed Weights   11/27/19 1110  Weight: (!) 155.5 kg (342 lb 12.8 oz)   Wt Readings from Last 3 Encounters:  11/27/19 (!) 155.5 kg (342 lb 12.8 oz)  10/28/19 (!) 154.2 kg (340 lb)  09/24/19 (!) 153 kg (337 lb 4.9 oz)    Physical Exam: General:  Well appearing. No resp difficulty HEENT: normal Neck: supple. no JVD. Carotids 2+ bilat; no bruits. No lymphadenopathy or thryomegaly appreciated. Cor: PMI nondisplaced. Regular rate & rhythm. No rubs, gallops or murmurs. Lungs: clear Abdomen: soft, nontender, nondistended. No hepatosplenomegaly. No bruits or masses. Good bowel sounds. Extremities: no cyanosis, clubbing, rash, 2+ ankle edema Neuro: alert & orientedx3, cranial nerves grossly intact. moves all 4 extremities w/o difficulty. Affect pleasant    Imaging: No results found.  EKG: Sinus 68 bpm. QTC 442. Personally reviewed    Assessment/Plan:   1. LE edema.  - mostly venous stasis. Will need compression hose (which he now has) - can take extra lasix for a few days to catch back up  - in future may be able to switch to spiro to limit need for potassium supplementation 2. PAF in NSR on Tikosyn - intermittent breakthroughs  -s/p previous AF ablation at [facility]   - AF well controlled on dofetilide. Remains in NSR  -Continue eliquis.  3. Morbid obesity    --Had lost a lot of weight with [employer] and now has gained some back - Body mass index is 46.49 kg/m. Much improved.  4. Psoriatic Arthritis  - Follows with Dr. Dierdre Forth 5. HTN  - Blood pressure well controlled. Continue current regimen. 6. H/o pericarditis felt to be related to Remicade 2015 7. Hyperlipidemia - Recheck fasting lipids. Will stop Niaspan  given lack of data. Consider Vascepa 8. Pre-op CV risk assessment.  - should be low risk for surgery with local or a block  Arvilla Meres, MD  11:47 AM

## 2019-11-29 DIAGNOSIS — R269 Unspecified abnormalities of gait and mobility: Secondary | ICD-10-CM | POA: Diagnosis not present

## 2019-11-29 DIAGNOSIS — M48062 Spinal stenosis, lumbar region with neurogenic claudication: Secondary | ICD-10-CM | POA: Diagnosis not present

## 2019-12-03 ENCOUNTER — Other Ambulatory Visit: Payer: Self-pay | Admitting: Neurological Surgery

## 2019-12-04 ENCOUNTER — Other Ambulatory Visit (HOSPITAL_COMMUNITY)
Admission: RE | Admit: 2019-12-04 | Discharge: 2019-12-04 | Disposition: A | Discharge status: Home / Self Care | Payer: BC Managed Care – PPO | Source: Ambulatory Visit | Attending: Neurological Surgery | Admitting: Neurological Surgery

## 2019-12-04 ENCOUNTER — Encounter (HOSPITAL_COMMUNITY): Payer: Self-pay | Admitting: Neurological Surgery

## 2019-12-04 ENCOUNTER — Other Ambulatory Visit: Payer: Self-pay

## 2019-12-04 DIAGNOSIS — Z01812 Encounter for preprocedural laboratory examination: Secondary | ICD-10-CM | POA: Diagnosis not present

## 2019-12-04 DIAGNOSIS — Z20822 Contact with and (suspected) exposure to covid-19: Secondary | ICD-10-CM | POA: Insufficient documentation

## 2019-12-04 DIAGNOSIS — R269 Unspecified abnormalities of gait and mobility: Secondary | ICD-10-CM | POA: Diagnosis not present

## 2019-12-04 DIAGNOSIS — M48062 Spinal stenosis, lumbar region with neurogenic claudication: Secondary | ICD-10-CM | POA: Diagnosis not present

## 2019-12-04 LAB — SARS CORONAVIRUS 2 (TAT 6-24 HRS): SARS Coronavirus 2: NEGATIVE

## 2019-12-04 NOTE — Progress Notes (Addendum)
PCP - Dr. Carilyn Goodpasture Koirala Cardiologist - Dr. Arvilla Meres  PPM/ICD - Denies  Chest x-ray - N/A EKG - 11/27/19 Stress Test - Over 10 years ago ECHO - 10/15/13 Cardiac Cath - 11/2011  Sleep Study - Denies   Blood Thinner Instructions: Eliquis LD: 12/02/19 Aspirin Instructions: N/A  ERAS Protcol - No  COVID TEST- 12/04/19   Anesthesia review:  Patient denies shortness of breath, fever, cough and chest pain.   All instructions explained to the patient, with a verbal understanding of the instructions. Patient also instructed to self quarantine after being tested for COVID-19. The opportunity to ask questions was provided.    Report to [facility] Main Entrance "A" at 9:30 A.M., and check in at the Admitting office.  Call this number if you have problems the morning of surgery:  [phone]    Remember:  Do not eat after midnight the night before your surgery    Take these medicines the morning of surgery with A SIP OF WATER:  dofetilide (TIKOSYN)  loratadine (CLARITIN)   If needed: fluticasone (FLONASE)  methocarbamol (ROBAXIN) traMADol (ULTRAM)   Follow your surgeon's instructions on when to stop Eliquis.  If no instructions were given by your surgeon then you will need to call the office to get those instructions.     STOP taking any Aspirin containing products such as: Aleve, Naproxen, Ibuprofen, Motrin, Advil, Goody's, BC's, all herbal medications, fish oil, and all vitamins.    The Morning of Surgery  Do not wear jewelry.  Do not wear lotions, powders, or colognes, or deodorant  Men may shave face and neck. Please shower the morning of surgery. Please wear clean clothes to the hospital/surgery center.   Remember to brush your teeth WITH YOUR REGULAR TOOTHPASTE.  Do not bring valuables to the hospital.  [facility] is not responsible for any belongings or valuables.  If you are a smoker, DO NOT Smoke 24 hours prior to surgery   Remember that you must have  someone to transport you home after your surgery, and remain with you for 24 hours if you are discharged the same day.

## 2019-12-04 NOTE — Anesthesia Preprocedure Evaluation (Addendum)
Anesthesia Evaluation  Patient identified by MRN, date of birth, ID band Patient awake    Reviewed: Allergy & Precautions, NPO status , Patient's Chart, lab work & pertinent test results  Airway Mallampati: II  TM Distance: >3 FB Neck ROM: Full    Dental no notable dental hx.    Pulmonary neg pulmonary ROS, former smoker,    Pulmonary exam normal breath sounds clear to auscultation       Cardiovascular hypertension, Normal cardiovascular exam+ dysrhythmias Atrial Fibrillation  Rhythm:Regular Rate:Normal     Neuro/Psych negative neurological ROS  negative psych ROS   GI/Hepatic negative GI ROS, Neg liver ROS,   Endo/Other  Morbid obesity  Renal/GU negative Renal ROS  negative genitourinary   Musculoskeletal negative musculoskeletal ROS (+)   Abdominal (+) + obese,   Peds negative pediatric ROS (+)  Hematology negative hematology ROS (+)   Anesthesia Other Findings   Reproductive/Obstetrics negative OB ROS                            Anesthesia Physical Anesthesia Plan  ASA: III  Anesthesia Plan: General   Post-op Pain Management:    Induction: Intravenous  PONV Risk Score and Plan: 2 and Ondansetron, Dexamethasone and Treatment may vary due to age or medical condition  Airway Management Planned: Oral ETT  Additional Equipment:   Intra-op Plan:   Post-operative Plan: Extubation in OR  Informed Consent: I have reviewed the patients History and Physical, chart, labs and discussed the procedure including the risks, benefits and alternatives for the proposed anesthesia with the patient or authorized representative who has indicated his/her understanding and acceptance.     Dental advisory given  Plan Discussed with: CRNA and Surgeon  Anesthesia Plan Comments: (Follows with cardiology for history of PAF in NSR on Tikosyn - intermittent breakthroughs, H/o pericarditis felt to be  related to Remicade 2015. Last seen by Dr. Gala Romney 11/27/19. and cleared for surgery at that time. Per note, "should be low risk for surgery with local or a block." Previous clearance on 08/19/19 prior to lumbar fusion stated "Pre-op CV risk assessment. - mild to moderate risk for peri-op CV complications due mainly to weight (coronary artereis ok) - Ok to proceed with surgery without further testing."  Pt recently underwent L3-5 fusion 09/24/19 without complication. Dr. Gala Romney did note some increase LE edema since surgery and recommended compression hose to help with venous stasis. He did also develop some peroneal neuritis with foot drop which has necessitated re intervention.   Psoriatic arthritis followed by rheumatology, maintained on golimumab.   Will need DOS labs and eval.  EKG 11/27/19: NSR. Rate 68.  Cath 2013: Assessment: 1) Normal coronary arteries 2) Normal LV function   Plan/Discussion: Continue risk factor modification. Consider PPI for chest pain.  TTE 2011: - Left ventricle: The cavity size was normal. Wall thickness was   increased in a pattern of mild LVH. Systolic function was normal.   The estimated ejection fraction was in the range of 55% to 60%.   Wall motion was normal; there were no regional wall motion   abnormalities.  - Mitral valve: Mild regurgitation.  - Left atrium: The atrium was moderately dilated. )       Anesthesia Quick Evaluation

## 2019-12-04 NOTE — Progress Notes (Signed)
Anesthesia Chart Review: Same day workup  Follows with cardiology for history of PAF in NSR on Tikosyn - intermittent breakthroughs, H/o pericarditis felt to be related to Remicade 2015. Last seen by Dr. Gala Romney 11/27/19. and cleared for surgery at that time. Per note, "should be low risk for surgery with local or a block." Previous clearance on 08/19/19 prior to lumbar fusion stated "Pre-op CV risk assessment. - mild to moderate risk for peri-op CV complications due mainly to weight (coronary artereis ok) - Ok to proceed with surgery without further testing."  Pt recently underwent L3-5 fusion 09/24/19 without complication. Dr. Gala Romney did note some increase LE edema since surgery and recommended compression hose to help with venous stasis. He did also develop some peroneal neuritis with foot drop which has necessitated re intervention.   Psoriatic arthritis followed by rheumatology, maintained on golimumab.   Will need DOS labs and eval.  EKG 11/27/19: NSR. Rate 68.  Cath 2013: Assessment: 1) Normal coronary arteries 2) Normal LV function   Plan/Discussion: Continue risk factor modification. Consider PPI for chest pain.  TTE 2011: - Left ventricle: The cavity size was normal. Wall thickness was   increased in a pattern of mild LVH. Systolic function was normal.   The estimated ejection fraction was in the range of 55% to 60%.   Wall motion was normal; there were no regional wall motion   abnormalities.  - Mitral valve: Mild regurgitation.  - Left atrium: The atrium was moderately dilated.    [facility] Short Stay Center/Anesthesiology Phone [phone] 12/04/2019 11:40 AM

## 2019-12-05 ENCOUNTER — Encounter (HOSPITAL_COMMUNITY)
Admission: RE | Disposition: A | Discharge status: Home / Self Care | Payer: Self-pay | Source: Home / Self Care | Attending: Neurological Surgery

## 2019-12-05 ENCOUNTER — Encounter (HOSPITAL_COMMUNITY): Payer: Self-pay | Admitting: Neurological Surgery

## 2019-12-05 ENCOUNTER — Ambulatory Visit (HOSPITAL_COMMUNITY): Payer: BC Managed Care – PPO | Admitting: Physician Assistant

## 2019-12-05 ENCOUNTER — Other Ambulatory Visit: Payer: Self-pay

## 2019-12-05 ENCOUNTER — Ambulatory Visit (HOSPITAL_COMMUNITY)
Admission: RE | Admit: 2019-12-05 | Discharge: 2019-12-05 | Disposition: A | Discharge status: Home / Self Care | Payer: BC Managed Care – PPO | Attending: Neurological Surgery | Admitting: Neurological Surgery

## 2019-12-05 DIAGNOSIS — Z6841 Body Mass Index (BMI) 40.0 and over, adult: Secondary | ICD-10-CM | POA: Diagnosis not present

## 2019-12-05 DIAGNOSIS — L4050 Arthropathic psoriasis, unspecified: Secondary | ICD-10-CM | POA: Insufficient documentation

## 2019-12-05 DIAGNOSIS — E7800 Pure hypercholesterolemia, unspecified: Secondary | ICD-10-CM | POA: Insufficient documentation

## 2019-12-05 DIAGNOSIS — Y9389 Activity, other specified: Secondary | ICD-10-CM | POA: Insufficient documentation

## 2019-12-05 DIAGNOSIS — Z7901 Long term (current) use of anticoagulants: Secondary | ICD-10-CM | POA: Diagnosis not present

## 2019-12-05 DIAGNOSIS — M21372 Foot drop, left foot: Secondary | ICD-10-CM | POA: Insufficient documentation

## 2019-12-05 DIAGNOSIS — I10 Essential (primary) hypertension: Secondary | ICD-10-CM | POA: Diagnosis not present

## 2019-12-05 DIAGNOSIS — E782 Mixed hyperlipidemia: Secondary | ICD-10-CM | POA: Diagnosis not present

## 2019-12-05 DIAGNOSIS — X58XXXA Exposure to other specified factors, initial encounter: Secondary | ICD-10-CM | POA: Diagnosis not present

## 2019-12-05 DIAGNOSIS — Z888 Allergy status to other drugs, medicaments and biological substances status: Secondary | ICD-10-CM | POA: Insufficient documentation

## 2019-12-05 DIAGNOSIS — K219 Gastro-esophageal reflux disease without esophagitis: Secondary | ICD-10-CM | POA: Diagnosis not present

## 2019-12-05 DIAGNOSIS — I4891 Unspecified atrial fibrillation: Secondary | ICD-10-CM | POA: Diagnosis not present

## 2019-12-05 DIAGNOSIS — T8189XA Other complications of procedures, not elsewhere classified, initial encounter: Secondary | ICD-10-CM | POA: Diagnosis not present

## 2019-12-05 DIAGNOSIS — Z79899 Other long term (current) drug therapy: Secondary | ICD-10-CM | POA: Diagnosis not present

## 2019-12-05 DIAGNOSIS — Z981 Arthrodesis status: Secondary | ICD-10-CM | POA: Insufficient documentation

## 2019-12-05 DIAGNOSIS — G5732 Lesion of lateral popliteal nerve, left lower limb: Secondary | ICD-10-CM | POA: Diagnosis not present

## 2019-12-05 DIAGNOSIS — I480 Paroxysmal atrial fibrillation: Secondary | ICD-10-CM | POA: Insufficient documentation

## 2019-12-05 DIAGNOSIS — Z86718 Personal history of other venous thrombosis and embolism: Secondary | ICD-10-CM | POA: Diagnosis not present

## 2019-12-05 DIAGNOSIS — Z87891 Personal history of nicotine dependence: Secondary | ICD-10-CM | POA: Diagnosis not present

## 2019-12-05 DIAGNOSIS — Z791 Long term (current) use of non-steroidal anti-inflammatories (NSAID): Secondary | ICD-10-CM | POA: Diagnosis not present

## 2019-12-05 HISTORY — PX: PERONEAL NERVE DECOMPRESSION: SHX2226

## 2019-12-05 HISTORY — DX: Gastro-esophageal reflux disease without esophagitis: K21.9

## 2019-12-05 LAB — CBC
HCT: 46 % (ref 39.0–52.0)
Hemoglobin: 15.3 g/dL (ref 13.0–17.0)
MCH: 31.5 pg (ref 26.0–34.0)
MCHC: 33.3 g/dL (ref 30.0–36.0)
MCV: 94.7 fL (ref 80.0–100.0)
Platelets: 131 10*3/uL — ABNORMAL LOW (ref 150–400)
RBC: 4.86 MIL/uL (ref 4.22–5.81)
RDW: 12.8 % (ref 11.5–15.5)
WBC: 3.3 10*3/uL — ABNORMAL LOW (ref 4.0–10.5)
nRBC: 0 % (ref 0.0–0.2)

## 2019-12-05 LAB — COMPREHENSIVE METABOLIC PANEL
ALT: 26 U/L (ref 0–44)
AST: 23 U/L (ref 15–41)
Albumin: 3.7 g/dL (ref 3.5–5.0)
Alkaline Phosphatase: 55 U/L (ref 38–126)
Anion gap: 8 (ref 5–15)
BUN: 17 mg/dL (ref 6–20)
CO2: 22 mmol/L (ref 22–32)
Calcium: 8.8 mg/dL — ABNORMAL LOW (ref 8.9–10.3)
Chloride: 110 mmol/L (ref 98–111)
Creatinine, Ser: 0.6 mg/dL — ABNORMAL LOW (ref 0.61–1.24)
GFR calc Af Amer: >60 mL/min (ref >60)
GFR calc non Af Amer: >60 mL/min (ref >60)
Glucose, Bld: 114 mg/dL — ABNORMAL HIGH (ref 70–99)
Potassium: 4 mmol/L (ref 3.5–5.1)
Sodium: 140 mmol/L (ref 135–145)
Total Bilirubin: 0.7 mg/dL (ref 0.3–1.2)
Total Protein: 6.6 g/dL (ref 6.5–8.1)

## 2019-12-05 LAB — PROTIME-INR
INR: 0.8 (ref 0.8–1.2)
Prothrombin Time: 11.1 seconds — ABNORMAL LOW (ref 11.4–15.2)

## 2019-12-05 SURGERY — PERONEAL NERVE DECOMPRESSION
Anesthesia: General | Laterality: Left

## 2019-12-05 MED ORDER — PROPOFOL 10 MG/ML IV BOLUS
INTRAVENOUS | Status: DC | PRN
  Administered 2019-12-05: 300 mg via INTRAVENOUS

## 2019-12-05 MED ORDER — EPHEDRINE SULFATE-NACL 50-0.9 MG/10ML-% IV SOSY
PREFILLED_SYRINGE | INTRAVENOUS | Status: DC | PRN
  Administered 2019-12-05: 5 mg via INTRAVENOUS
  Administered 2019-12-05: 10 mg via INTRAVENOUS
  Administered 2019-12-05: 7.5 mg via INTRAVENOUS

## 2019-12-05 MED ORDER — PROPOFOL 10 MG/ML IV BOLUS
INTRAVENOUS | Status: AC
  Filled 2019-12-05: qty 20

## 2019-12-05 MED ORDER — FENTANYL CITRATE (PF) 250 MCG/5ML IJ SOLN
INTRAMUSCULAR | Status: AC
  Filled 2019-12-05: qty 5

## 2019-12-05 MED ORDER — LACTATED RINGERS IV SOLN: INTRAVENOUS | Status: DC

## 2019-12-05 MED ORDER — FENTANYL CITRATE (PF) 100 MCG/2ML IJ SOLN
INTRAMUSCULAR | Status: AC
  Filled 2019-12-05: qty 2

## 2019-12-05 MED ORDER — MIDAZOLAM HCL 5 MG/5ML IJ SOLN
INTRAMUSCULAR | Status: DC | PRN
  Administered 2019-12-05: 2 mg via INTRAVENOUS

## 2019-12-05 MED ORDER — DEXAMETHASONE SODIUM PHOSPHATE 10 MG/ML IJ SOLN
INTRAMUSCULAR | Status: DC | PRN
  Administered 2019-12-05: 10 mg via INTRAVENOUS

## 2019-12-05 MED ORDER — KETOROLAC TROMETHAMINE 30 MG/ML IJ SOLN: 30.0000 mg | Freq: Once | INTRAMUSCULAR | Status: DC | PRN

## 2019-12-05 MED ORDER — MIDAZOLAM HCL 2 MG/2ML IJ SOLN
INTRAMUSCULAR | Status: AC
  Filled 2019-12-05: qty 2

## 2019-12-05 MED ORDER — FENTANYL CITRATE (PF) 100 MCG/2ML IJ SOLN: 25.0000 ug | INTRAMUSCULAR | Status: DC | PRN

## 2019-12-05 MED ORDER — BUPIVACAINE HCL (PF) 0.5 % IJ SOLN
INTRAMUSCULAR | Status: AC
  Filled 2019-12-05: qty 30

## 2019-12-05 MED ORDER — LIDOCAINE-EPINEPHRINE 1 %-1:100000 IJ SOLN
INTRAMUSCULAR | Status: DC | PRN
  Administered 2019-12-05: 5 mL

## 2019-12-05 MED ORDER — ONDANSETRON HCL 4 MG/2ML IJ SOLN
INTRAMUSCULAR | Status: DC | PRN
  Administered 2019-12-05: 4 mg via INTRAVENOUS

## 2019-12-05 MED ORDER — LIDOCAINE HCL (CARDIAC) PF 100 MG/5ML IV SOSY
PREFILLED_SYRINGE | INTRAVENOUS | Status: DC | PRN
  Administered 2019-12-05: 80 mg via INTRAVENOUS

## 2019-12-05 MED ORDER — SODIUM CHLORIDE 0.9 % IV SOLN
INTRAVENOUS | Status: DC | PRN
  Administered 2019-12-05: 500 mL

## 2019-12-05 MED ORDER — DEXTROSE 5 % IV SOLN
INTRAVENOUS | Status: DC | PRN
  Administered 2019-12-05: 3 g via INTRAVENOUS

## 2019-12-05 MED ORDER — FENTANYL CITRATE (PF) 250 MCG/5ML IJ SOLN
INTRAMUSCULAR | Status: DC | PRN
  Administered 2019-12-05: 50 ug via INTRAVENOUS
  Administered 2019-12-05: 150 ug via INTRAVENOUS

## 2019-12-05 MED ORDER — LIDOCAINE-EPINEPHRINE 1 %-1:100000 IJ SOLN
INTRAMUSCULAR | Status: AC
  Filled 2019-12-05: qty 1

## 2019-12-05 MED ORDER — BUPIVACAINE HCL (PF) 0.5 % IJ SOLN
INTRAMUSCULAR | Status: DC | PRN
  Administered 2019-12-05: 5 mL

## 2019-12-05 SURGICAL SUPPLY — 59 items
ADH SKN CLS APL DERMABOND .7 (GAUZE/BANDAGES/DRESSINGS) ×1
APL SKNCLS STERI-STRIP NONHPOA (GAUZE/BANDAGES/DRESSINGS) ×1
BAG DECANTER FOR FLEXI CONT (MISCELLANEOUS) ×3 IMPLANT
BANDAGE ESMARK 6X9 LF (GAUZE/BANDAGES/DRESSINGS) ×1 IMPLANT
BENZOIN TINCTURE PRP APPL 2/3 (GAUZE/BANDAGES/DRESSINGS) ×3 IMPLANT
BNDG CMPR 9X6 STRL LF SNTH (GAUZE/BANDAGES/DRESSINGS) ×1
BNDG CMPR MED 15X6 ELC VLCR LF (GAUZE/BANDAGES/DRESSINGS) ×1
BNDG ELASTIC 4X5.8 VLCR STR LF (GAUZE/BANDAGES/DRESSINGS) ×3 IMPLANT
BNDG ELASTIC 6X15 VLCR STRL LF (GAUZE/BANDAGES/DRESSINGS) ×2 IMPLANT
BNDG ESMARK 6X9 LF (GAUZE/BANDAGES/DRESSINGS) ×3
BNDG GAUZE ELAST 4 BULKY (GAUZE/BANDAGES/DRESSINGS) ×3 IMPLANT
CABLE BIPOLOR RESECTION CORD (MISCELLANEOUS) ×3 IMPLANT
CANISTER SUCT 3000ML PPV (MISCELLANEOUS) ×3 IMPLANT
CARTRIDGE OIL MAESTRO DRILL (MISCELLANEOUS) IMPLANT
CLOSURE WOUND 1/2 X4 (GAUZE/BANDAGES/DRESSINGS)
COVER WAND RF STERILE (DRAPES) ×3 IMPLANT
CUFF TOURN SGL QUICK 42 (TOURNIQUET CUFF) ×2 IMPLANT
DERMABOND ADVANCED (GAUZE/BANDAGES/DRESSINGS) ×2
DERMABOND ADVANCED .7 DNX12 (GAUZE/BANDAGES/DRESSINGS) ×1 IMPLANT
DIFFUSER DRILL AIR PNEUMATIC (MISCELLANEOUS) ×3 IMPLANT
DRAPE LAPAROTOMY 100X72X124 (DRAPES) IMPLANT
DURAPREP 26ML APPLICATOR (WOUND CARE) ×3 IMPLANT
ELECT REM PT RETURN 9FT ADLT (ELECTROSURGICAL) ×3
ELECTRODE REM PT RTRN 9FT ADLT (ELECTROSURGICAL) ×1 IMPLANT
GAUZE SPONGE 4X4 12PLY STRL (GAUZE/BANDAGES/DRESSINGS) ×3 IMPLANT
GLOVE BIO SURGEON STRL SZ 6.5 (GLOVE) ×2 IMPLANT
GLOVE BIO SURGEONS STRL SZ 6.5 (GLOVE) ×1
GLOVE BIOGEL PI IND STRL 6.5 (GLOVE) ×1 IMPLANT
GLOVE BIOGEL PI IND STRL 7.5 (GLOVE) ×2 IMPLANT
GLOVE BIOGEL PI IND STRL 8.5 (GLOVE) ×1 IMPLANT
GLOVE BIOGEL PI INDICATOR 6.5 (GLOVE) ×2
GLOVE BIOGEL PI INDICATOR 7.5 (GLOVE) ×4
GLOVE BIOGEL PI INDICATOR 8.5 (GLOVE) ×2
GLOVE ECLIPSE 6.5 STRL STRAW (GLOVE) ×3 IMPLANT
GLOVE ECLIPSE 8.5 STRL (GLOVE) ×3 IMPLANT
GLOVE EXAM NITRILE XL STR (GLOVE) IMPLANT
GOWN STRL REUS W/ TWL LRG LVL3 (GOWN DISPOSABLE) IMPLANT
GOWN STRL REUS W/ TWL XL LVL3 (GOWN DISPOSABLE) ×1 IMPLANT
GOWN STRL REUS W/TWL 2XL LVL3 (GOWN DISPOSABLE) ×3 IMPLANT
GOWN STRL REUS W/TWL LRG LVL3 (GOWN DISPOSABLE)
GOWN STRL REUS W/TWL XL LVL3 (GOWN DISPOSABLE) ×3
KIT BASIN OR (CUSTOM PROCEDURE TRAY) ×3 IMPLANT
KIT TURNOVER KIT B (KITS) ×3 IMPLANT
LOOP VESSEL MAXI BLUE (MISCELLANEOUS) ×3 IMPLANT
NEEDLE HYPO 25X1 1.5 SAFETY (NEEDLE) ×3 IMPLANT
NS IRRIG 1000ML POUR BTL (IV SOLUTION) ×3 IMPLANT
OIL CARTRIDGE MAESTRO DRILL (MISCELLANEOUS)
PACK LAMINECTOMY NEURO (CUSTOM PROCEDURE TRAY) ×3 IMPLANT
PAD ARMBOARD 7.5X6 YLW CONV (MISCELLANEOUS) IMPLANT
STOCKINETTE 4X48 STRL (DRAPES) ×3 IMPLANT
STRIP CLOSURE SKIN 1/2X4 (GAUZE/BANDAGES/DRESSINGS) IMPLANT
SUT VIC AB 2-0 CP2 18 (SUTURE) IMPLANT
SUT VIC AB 3-0 SH 8-18 (SUTURE) ×3 IMPLANT
SUT VIC AB 4-0 RB1 18 (SUTURE) ×2 IMPLANT
SYR CONTROL 10ML LL (SYRINGE) ×3 IMPLANT
TOWEL GREEN STERILE (TOWEL DISPOSABLE) ×3 IMPLANT
TOWEL GREEN STERILE FF (TOWEL DISPOSABLE) ×3 IMPLANT
UNDERPAD 30X30 (UNDERPADS AND DIAPERS) IMPLANT
WATER STERILE IRR 1000ML POUR (IV SOLUTION) ×3 IMPLANT

## 2019-12-05 NOTE — Transfer of Care (Signed)
Immediate Anesthesia Transfer of Care Note  Patient: Mark Gonzalez.  Procedure(s) Performed: Left PERONEAL NERVE DECOMPRESSION (Left )  Patient Location: PACU  Anesthesia Type:General  Level of Consciousness: awake, alert  and oriented  Airway & Oxygen Therapy: Patient Spontanous Breathing and Patient connected to nasal cannula oxygen  Post-op Assessment: Report given to RN, Post -op Vital signs reviewed and stable and Patient moving all extremities X 4  Post vital signs: Reviewed and stable  Last Vitals:  Vitals Value Taken Time  BP 123/75 12/05/19 1431  Temp 36.2 C 12/05/19 1430  Pulse 71 12/05/19 1432  Resp 25 12/05/19 1432  SpO2 100 % 12/05/19 1432  Vitals shown include unvalidated device data.  Last Pain:  Vitals:   12/05/19 1010  TempSrc:   PainSc: 0-No pain      Patients Stated Pain Goal: 0 (12/05/19 1010)  Complications: No apparent anesthesia complications

## 2019-12-05 NOTE — Anesthesia Postprocedure Evaluation (Signed)
Anesthesia Post Note  Patient: Mark Gonzalez.  Procedure(s) Performed: Left PERONEAL NERVE DECOMPRESSION (Left )     Patient location during evaluation: PACU Anesthesia Type: General Level of consciousness: awake and alert Pain management: pain level controlled Vital Signs Assessment: post-procedure vital signs reviewed and stable Respiratory status: spontaneous breathing, nonlabored ventilation, respiratory function stable and patient connected to nasal cannula oxygen Cardiovascular status: blood pressure returned to baseline and stable Postop Assessment: no apparent nausea or vomiting Anesthetic complications: no    Last Vitals:  Vitals:   12/05/19 1430 12/05/19 1445  BP: 123/75 123/70  Pulse: 65 70  Resp: 12 12  Temp: (!) 36.2 C   SpO2: 100% 98%    Last Pain:  Vitals:   12/05/19 1430  TempSrc:   PainSc: 0-No pain                 Quintara Bost S

## 2019-12-05 NOTE — Op Note (Signed)
Date of surgery: 12/05/2019 Preoperative diagnosis: Left peroneal nerve entrapment at the fibular head Postoperative diagnosis: Same Procedure: Left peroneal nerve decompression at the fibular head Surgeon: Barnett Abu Anesthesia: General endotracheal Indications: Mr. Milus Fritze is a 59 year old individual who in February underwent surgical decompression stabilization from L3-L5.  Postoperatively he woke with weakness in the tibialis anterior on the left side.  This has not improved the pain despite the passage of time and EMG nerve conduction studies demonstrate that the patient has a severe slowing of the peroneal nerve function at the fibular head.  Because of this he has been advised regarding surgical decompression of the peroneal nerve.  Is now being completed.  Procedure: Patient was brought to the operating room and placed on the table in supine position after the smooth induction of general endotracheal anesthesia using an LMA tube the left leg was marked with an incision just below the fibular head on the lateral aspect of the shin and the skin was cleansed with alcohol DuraPrep and draped in a sterile fashion with a cuff being applied.  The leg was exsanguinated and the cuff was inflated.  Then 10 cc of local anesthetic were instilled into the subcutaneous tissues and a linear incision was made in the chosen area just below the fibular head.  Dissection was carried down through several superficial fatty layers and ultimately the lateral aspect of the tibialis anterior muscle was identified in the posterior lateral tunnel for the peroneal nerve was identified.  Then by dissecting carefully the peroneal nerve was identified as it emerged from the popliteal fossa.  Was carefully dissected laterally until it entered under the tibialis anterior fascia there is a tight band of tissue in this region and by carefully dissecting we released the superficial tibialis fascia and then gradually the posterior  tibialis fascia was released along the path of the nerve.  The nerve was traced under the tibialis fascia for a distance and was found to be free and clear.  The acute tightness in this region of travel of the peroneal nerve was unprecedented and once released the nerve was much more relaxed in this area.  The soft tissues were checked carefully and a small vessels were cauterized and then the fascia was closed with 3-0 Vicryl in interrupted fashion 3-0 Vicryl was used in the subcuticular tissues 4-0 Vicryl for the superficial subcuticular layer Dermabond was placed on the skin and a dry dressing with an Ace wrap was placed over the incision and the cuff was relaxed.  Patient was returned to recovery room in stable condition.

## 2019-12-05 NOTE — H&P (Signed)
Mark Gonzalez. is an 59 y.o. male.   Chief Complaint: Peroneal nerve entrapment on the left HPI: Mark Gonzalez is a 59 year old individual who has had significant lumbar spinal surgery in February of this year.  This included decompression of L3-4 and L4-5 with posterior stabilization postoperatively the patient awoke with a foot drop on the left side that he did not have preoperatively we observe the situation and noted that during the time of surgery his left-sided L5 nerve root was indeed well decompressed and it was suspected that he may have had some trauma during the surgery that created the foot drop after approximately 6 weeks we obtain an EMG nerve conduction study that showed that he had severe fallout of nerve function at the fibular head.  After allowing about 3 months the past the patient is having continued problems with a foot drop and he is now taken to the operating room to undergo peroneal nerve decompression around the fibular head.    Past Medical History:  Diagnosis Date  . Atrial flutter (HCC)    a. s/p DCCV 08/16/2012.  . Cellulitis   . Cellulitis of leg, left 09/24/2015  . DVT (deep venous thrombosis) (HCC) 09/2014   "may have just been cellulitis"   . Dysrhythmia    afib  . GERD (gastroesophageal reflux disease)   . Hypercholesterolemia   . Hypertension   . Morbid obesity (HCC)   . NICM (nonischemic cardiomyopathy) (HCC)    a. EF 45-50% in setting of AF in past; b. Neg MV;  c. 04/2010 Echo: EF 55-60%;  d. 11/2011 Cath: nl cors.  . Paroxysmal atrial fibrillation (HCC)    a. s/p DCCV 09/2007, 11/2007, 04/2013 --> flecainide and chronic coumadin.  . Pneumonia ~ 1995  . Psoriasis    "leasions multiple places on my body; under control w/RX now" (09/25/2015)  . Psoriatic arthritis (HCC)    "primarily in my hands" (09/25/2015)    Past Surgical History:  Procedure Laterality Date  . ANTERIOR LAT LUMBAR FUSION Right 09/24/2019   Procedure: Right Lumbar Three-Four Lumbar  Four-Five Anterolateral decompression/fusion/percutaneous pedicle screw fixation;  Surgeon: Barnett Abu, MD;  Location: [facility] OR;  Service: Neurosurgery;  Laterality: Right;  anterolateral  . APPENDECTOMY    . APPLICATION OF ROBOTIC ASSISTANCE FOR SPINAL PROCEDURE N/A 09/24/2019   Procedure: APPLICATION OF ROBOTIC ASSISTANCE FOR SPINAL PROCEDURE;  Surgeon: Barnett Abu, MD;  Location: [facility] OR;  Service: Neurosurgery;  Laterality: N/A;  . ATRIAL FIBRILLATION ABLATION  07/2011  . BACK SURGERY    . CARDIAC CATHETERIZATION   11/2011  . CARDIOVERSION     "I've probably had 10" (09/25/2015)  . CARDIOVERSION  08/16/2012   Procedure: CARDIOVERSION;  Surgeon: Dolores Patty, MD;  Location: [facility] ENDOSCOPY;  Service: Cardiovascular;  Laterality: N/A;  . CARDIOVERSION N/A 06/05/2013   Procedure: CARDIOVERSION;  Surgeon: Dolores Patty, MD;  Location: [facility] OR;  Service: Cardiovascular;  Laterality: N/A;  . CARDIOVERSION N/A 07/11/2014   Procedure: CARDIOVERSION;  Surgeon: Dolores Patty, MD;  Location: [facility] ENDOSCOPY;  Service: Cardiovascular;  Laterality: N/A;  . COLONOSCOPY    . FRACTURE SURGERY    . LEFT HEART CATHETERIZATION WITH CORONARY ANGIOGRAM N/A 12/22/2011   Procedure: LEFT HEART CATHETERIZATION WITH CORONARY ANGIOGRAM;  Surgeon: Dolores Patty, MD;  Location: [facility] CATH LAB;  Service: Cardiovascular;  Laterality: N/A;  . LUMBAR PERCUTANEOUS PEDICLE SCREW 2 LEVEL N/A 09/24/2019   Procedure: Introlateral Decompression Lumbar Three-Four with XLIFT Spacer, Arthrodesis with Allograft, Posterior  Fixation Lumbar Three-Five with Percutaneous screws with Robotic Assistance. Laminectomy Lumbar Four-Five Posterior Lumbar Interbody Arthrodesis Unilateral Lumbar four-Five On Left, Posterior Lateral Arthrodesis Lumbar Four-Five Bilaterally.;  Surgeon: Barnett Abu, MD;  Location: [facility] OR  . ORIF METATARSAL FRACTURE  ~ 2001   "5th"  . TONSILLECTOMY      Family History  Problem Relation Age of Onset  . Heart  disease Neg Hx    Social History:  reports that he quit smoking about 29 years ago. His smoking use included cigarettes. He has a 8.00 pack-year smoking history. He has never used smokeless tobacco. He reports current alcohol use of about 10.0 standard drinks of alcohol per week. He reports current drug use. Drug: Marijuana.  Allergies:  Allergies  Allergen Reactions  . Lisinopril Cough    Medications Prior to Admission  Medication Sig Dispense Refill  . apixaban (ELIQUIS) 5 MG TABS tablet TAKE 1 TABLET(5 MG) BY MOUTH TWICE DAILY (Patient taking differently: Take 5 mg by mouth 2 (two) times daily. ) 60 tablet 5  . b complex vitamins tablet Take 1 tablet by mouth daily.    . calcium carbonate (TUMS - DOSED IN MG ELEMENTAL CALCIUM) 500 MG chewable tablet Chew 1 tablet by mouth daily as needed for indigestion or heartburn.    . Cholecalciferol (VITAMIN D-3) 125 MCG (5000 UT) TABS Take 5,000 Units by mouth daily.    . diclofenac (VOLTAREN) 75 MG EC tablet Take 75 mg by mouth daily.   0  . dofetilide (TIKOSYN) 500 MCG capsule Take 1 capsule (500 mcg total) by mouth 2 (two) times daily. 60 capsule 5  . fluticasone (FLONASE) 50 MCG/ACT nasal spray Place 2 sprays into both nostrils daily. (Patient taking differently: Place 2 sprays into both nostrils daily as needed for allergies. ) 16 g 0  . furosemide (LASIX) 40 MG tablet Take 1 tablet (40 mg total) by mouth daily. 30 tablet 5  . irbesartan (AVAPRO) 150 MG tablet Take 1 tablet (150 mg total) by mouth daily. (Patient taking differently: Take 150 mg by mouth every evening. ) 30 tablet 11  . loratadine (CLARITIN) 10 MG tablet Take 10 mg by mouth daily.    Marland Kitchen lovastatin (MEVACOR) 20 MG tablet TAKE 1 TABLET(20 MG) BY MOUTH AT BEDTIME (Patient taking differently: Take 20 mg by mouth at bedtime. ) 30 tablet 5  . Magnesium 400 MG CAPS Take 400 mg by mouth every evening.     . methocarbamol (ROBAXIN) 500 MG tablet Take 1 tablet (500 mg total) by mouth every  6 (six) hours as needed for muscle spasms. 40 tablet 3  . Milk Thistle 250 MG CAPS Take 250 mg by mouth every evening.    . Multiple Vitamin (MULTIVITAMIN WITH MINERALS) TABS tablet Take 1 tablet by mouth daily.    . Multiple Vitamins-Minerals (PRESERVISION AREDS 2 PO) Take 1 tablet by mouth in the morning and at bedtime.    . Omega-3 Fatty Acids (FISH OIL) 1200 MG CAPS Take 1,200 mg by mouth in the morning and at bedtime.    . potassium chloride SA (KLOR-CON) 20 MEQ tablet Take 2 tablets (40 mEq total) by mouth daily. (Patient taking differently: Take 40 mEq by mouth at bedtime. ) 60 tablet 3  . Probiotic Product (PROBIOTIC PO) Take 1 capsule by mouth daily.     . traMADol (ULTRAM) 50 MG tablet Take 50 mg by mouth every 6 (six) hours as needed (for pain.).    Marland Kitchen golimumab (SIMPONI ARIA)  50 MG/4ML SOLN injection Inject into the vein every 8 (eight) weeks.     Marland Kitchen oxyCODONE-acetaminophen (PERCOCET/ROXICET) 5-325 MG tablet Take 1-2 tablets by mouth every 4 (four) hours as needed for moderate pain or severe pain. (Patient not taking: Reported on 12/03/2019) 60 tablet 0    Results for orders placed or performed during the hospital encounter of 12/05/19 (from the past 48 hour(s))  CBC     Status: Abnormal   Collection Time: 12/05/19  9:56 AM  Result Value Ref Range   WBC 3.3 (L) 4.0 - 10.5 K/uL   RBC 4.86 4.22 - 5.81 MIL/uL   Hemoglobin 15.3 13.0 - 17.0 g/dL   HCT 14.9 70.2 - 63.7 %   MCV 94.7 80.0 - 100.0 fL   MCH 31.5 26.0 - 34.0 pg   MCHC 33.3 30.0 - 36.0 g/dL   RDW 85.8 85.0 - 27.7 %   Platelets 131 (L) 150 - 400 K/uL    Comment: REPEATED TO VERIFY   nRBC 0.0 0.0 - 0.2 %    Comment: Performed at [facility], [address] [place], [place] [address]  Comprehensive metabolic panel     Status: Abnormal   Collection Time: 12/05/19  9:56 AM  Result Value Ref Range   Sodium 140 135 - 145 mmol/L   Potassium 4.0 3.5 - 5.1 mmol/L   Chloride 110 98 - 111 mmol/L   CO2 22 22 - 32 mmol/L    Glucose, Bld 114 (H) 70 - 99 mg/dL    Comment: Glucose reference range applies only to samples taken after fasting for at least 8 hours.   BUN 17 6 - 20 mg/dL   Creatinine, Ser 8.67 (L) 0.61 - 1.24 mg/dL   Calcium 8.8 (L) 8.9 - 10.3 mg/dL   Total Protein 6.6 6.5 - 8.1 g/dL   Albumin 3.7 3.5 - 5.0 g/dL   AST 23 15 - 41 U/L   ALT 26 0 - 44 U/L   Alkaline Phosphatase 55 38 - 126 U/L   Total Bilirubin 0.7 0.3 - 1.2 mg/dL   GFR calc non Af Amer >60 >60 mL/min   GFR calc Af Amer >60 >60 mL/min   Anion gap 8 5 - 15    Comment: Performed at [facility], [address], [place], [place] [address]  Protime-INR     Status: Abnormal   Collection Time: 12/05/19  9:56 AM  Result Value Ref Range   Prothrombin Time 11.1 (L) 11.4 - 15.2 seconds   INR 0.8 0.8 - 1.2    Comment: (NOTE) INR goal varies based on device and disease states. Performed at [facility], [address] [place], [place] [address]    No results found.  Review of Systems  Constitutional: Negative.   HENT: Negative.   Eyes: Negative.   Respiratory: Negative.   Gastrointestinal: Negative.   Endocrine: Negative.   Genitourinary: Negative.   Musculoskeletal: Positive for gait problem.  Allergic/Immunologic: Negative.   Neurological: Positive for weakness.  Hematological: Negative.   Psychiatric/Behavioral: Negative.     Blood pressure (!) 150/90, pulse 65, temperature 98.4 F (36.9 C), temperature source Oral, resp. rate 17, height 6' (1.829 m), weight (!) 155.1 kg, SpO2 98 %. Physical Exam  Constitutional: He is oriented to person, place, and time. He appears well-developed and well-nourished.  HENT:  Head: Normocephalic and atraumatic.  Eyes: Pupils are equal, round, and reactive to light. Conjunctivae and EOM are normal.  Cardiovascular:  Normal rate and regular rhythm.  Respiratory: Effort normal and breath sounds normal.  GI: Soft. Bowel sounds are normal.  Musculoskeletal:     Cervical back:  Normal range of motion and neck supple.     Comments: Patrick's maneuver is negative bilaterally.  Back is nontender to palpation well-healed incisions in the midline and paramedian regions  Neurological: He is alert and oriented to person, place, and time.  Absent tibialis anterior function and absent extensor hallucis longus function on the left side.  Normal strength on the right side.  Straight leg raising is negative to 60 degrees bilaterally.  Patrick's maneuver is negative bilaterally.  Cranial nerve examination is within limits of normal.  Skin: Skin is warm and dry.  Psychiatric: He has a normal mood and affect. His behavior is normal. Judgment and thought content normal.     Assessment/Plan Peroneal nerve entrapment at the fibular head on the left.  Plan: Decompression of the peroneal nerve at the fibular head on the left  Earleen Newport, MD 12/05/2019, 12:41 PM

## 2019-12-05 NOTE — Anesthesia Procedure Notes (Signed)
Procedure Name: LMA Insertion Date/Time: 12/05/2019 1:16 PM Performed by: Lanell Matar, CRNA Pre-anesthesia Checklist: Patient identified, Emergency Drugs available, Suction available and Patient being monitored Patient Re-evaluated:Patient Re-evaluated prior to induction Oxygen Delivery Method: Circle System Utilized Preoxygenation: Pre-oxygenation with 100% oxygen Induction Type: IV induction LMA: LMA inserted LMA Size: 5.0 Number of attempts: 1 Airway Equipment and Method: Bite block Placement Confirmation: positive ETCO2 Tube secured with: Tape Dental Injury: Teeth and Oropharynx as per pre-operative assessment

## 2019-12-11 DIAGNOSIS — M48062 Spinal stenosis, lumbar region with neurogenic claudication: Secondary | ICD-10-CM | POA: Diagnosis not present

## 2019-12-11 DIAGNOSIS — R269 Unspecified abnormalities of gait and mobility: Secondary | ICD-10-CM | POA: Diagnosis not present

## 2019-12-12 DIAGNOSIS — L4050 Arthropathic psoriasis, unspecified: Secondary | ICD-10-CM | POA: Diagnosis not present

## 2019-12-13 DIAGNOSIS — M21372 Foot drop, left foot: Secondary | ICD-10-CM | POA: Diagnosis not present

## 2019-12-25 DIAGNOSIS — M48062 Spinal stenosis, lumbar region with neurogenic claudication: Secondary | ICD-10-CM | POA: Diagnosis not present

## 2019-12-25 DIAGNOSIS — R269 Unspecified abnormalities of gait and mobility: Secondary | ICD-10-CM | POA: Diagnosis not present

## 2019-12-27 DIAGNOSIS — M48062 Spinal stenosis, lumbar region with neurogenic claudication: Secondary | ICD-10-CM | POA: Diagnosis not present

## 2019-12-27 DIAGNOSIS — R269 Unspecified abnormalities of gait and mobility: Secondary | ICD-10-CM | POA: Diagnosis not present

## 2020-01-01 DIAGNOSIS — M48062 Spinal stenosis, lumbar region with neurogenic claudication: Secondary | ICD-10-CM | POA: Diagnosis not present

## 2020-01-01 DIAGNOSIS — R269 Unspecified abnormalities of gait and mobility: Secondary | ICD-10-CM | POA: Diagnosis not present

## 2020-01-03 DIAGNOSIS — M48062 Spinal stenosis, lumbar region with neurogenic claudication: Secondary | ICD-10-CM | POA: Diagnosis not present

## 2020-01-03 DIAGNOSIS — R269 Unspecified abnormalities of gait and mobility: Secondary | ICD-10-CM | POA: Diagnosis not present

## 2020-01-08 DIAGNOSIS — R269 Unspecified abnormalities of gait and mobility: Secondary | ICD-10-CM | POA: Diagnosis not present

## 2020-01-08 DIAGNOSIS — M48062 Spinal stenosis, lumbar region with neurogenic claudication: Secondary | ICD-10-CM | POA: Diagnosis not present

## 2020-01-10 DIAGNOSIS — M48062 Spinal stenosis, lumbar region with neurogenic claudication: Secondary | ICD-10-CM | POA: Diagnosis not present

## 2020-01-10 DIAGNOSIS — R269 Unspecified abnormalities of gait and mobility: Secondary | ICD-10-CM | POA: Diagnosis not present

## 2020-01-13 DIAGNOSIS — R269 Unspecified abnormalities of gait and mobility: Secondary | ICD-10-CM | POA: Diagnosis not present

## 2020-01-13 DIAGNOSIS — M48062 Spinal stenosis, lumbar region with neurogenic claudication: Secondary | ICD-10-CM | POA: Diagnosis not present

## 2020-01-17 DIAGNOSIS — M48062 Spinal stenosis, lumbar region with neurogenic claudication: Secondary | ICD-10-CM | POA: Diagnosis not present

## 2020-01-17 DIAGNOSIS — R269 Unspecified abnormalities of gait and mobility: Secondary | ICD-10-CM | POA: Diagnosis not present

## 2020-01-22 DIAGNOSIS — M48062 Spinal stenosis, lumbar region with neurogenic claudication: Secondary | ICD-10-CM | POA: Diagnosis not present

## 2020-01-22 DIAGNOSIS — R269 Unspecified abnormalities of gait and mobility: Secondary | ICD-10-CM | POA: Diagnosis not present

## 2020-01-24 DIAGNOSIS — M48062 Spinal stenosis, lumbar region with neurogenic claudication: Secondary | ICD-10-CM | POA: Diagnosis not present

## 2020-01-24 DIAGNOSIS — R269 Unspecified abnormalities of gait and mobility: Secondary | ICD-10-CM | POA: Diagnosis not present

## 2020-01-28 DIAGNOSIS — M48062 Spinal stenosis, lumbar region with neurogenic claudication: Secondary | ICD-10-CM | POA: Diagnosis not present

## 2020-01-28 DIAGNOSIS — R269 Unspecified abnormalities of gait and mobility: Secondary | ICD-10-CM | POA: Diagnosis not present

## 2020-02-05 DIAGNOSIS — R269 Unspecified abnormalities of gait and mobility: Secondary | ICD-10-CM | POA: Diagnosis not present

## 2020-02-05 DIAGNOSIS — M48062 Spinal stenosis, lumbar region with neurogenic claudication: Secondary | ICD-10-CM | POA: Diagnosis not present

## 2020-02-06 DIAGNOSIS — L4050 Arthropathic psoriasis, unspecified: Secondary | ICD-10-CM | POA: Diagnosis not present

## 2020-02-13 DIAGNOSIS — M48062 Spinal stenosis, lumbar region with neurogenic claudication: Secondary | ICD-10-CM | POA: Diagnosis not present

## 2020-02-13 DIAGNOSIS — R269 Unspecified abnormalities of gait and mobility: Secondary | ICD-10-CM | POA: Diagnosis not present

## 2020-02-15 ENCOUNTER — Encounter (HOSPITAL_COMMUNITY): Payer: Self-pay | Admitting: *Deleted

## 2020-02-15 ENCOUNTER — Ambulatory Visit (HOSPITAL_COMMUNITY)
Admission: EM | Admit: 2020-02-15 | Discharge: 2020-02-15 | Disposition: A | Discharge status: Home / Self Care | Payer: BC Managed Care – PPO | Attending: Emergency Medicine | Admitting: Emergency Medicine

## 2020-02-15 ENCOUNTER — Ambulatory Visit (INDEPENDENT_AMBULATORY_CARE_PROVIDER_SITE_OTHER): Payer: BC Managed Care – PPO

## 2020-02-15 DIAGNOSIS — S93602A Unspecified sprain of left foot, initial encounter: Secondary | ICD-10-CM

## 2020-02-15 DIAGNOSIS — M79672 Pain in left foot: Secondary | ICD-10-CM

## 2020-02-15 DIAGNOSIS — M21372 Foot drop, left foot: Secondary | ICD-10-CM

## 2020-02-15 DIAGNOSIS — M19072 Primary osteoarthritis, left ankle and foot: Secondary | ICD-10-CM | POA: Diagnosis not present

## 2020-02-15 DIAGNOSIS — M7732 Calcaneal spur, left foot: Secondary | ICD-10-CM | POA: Diagnosis not present

## 2020-02-15 NOTE — Discharge Instructions (Addendum)
X-ray shows no fracture of left foot.  You likely have a sprain of the ligaments and intrinsic muscles of the left foot, from vigorous physical therapy recently, after year perineal/foot drop surgery few months ago. Tylenol if needed for pain. (I know you declined any other pain Rx) continue wearing the foot drop brace/boot that you have.  Follow-up with your physical therapist, orthopedist and neurosurgeon.  Also follow-up with Dr.Koriala,your PCP. - No sign of any acute left ankle injury on physical exam.  No sign of DVT on physical exam today.

## 2020-02-15 NOTE — ED Triage Notes (Signed)
Pt reports hx of back surgery Feb, had LLE nerve surgery in May for foot-drop and has been wearing a brace since, along with doing PT.  Over past 7-10 days, has been having left dorsal foot pain with progressive worsening.

## 2020-02-15 NOTE — ED Provider Notes (Signed)
[facility]    CSN: [id number] Arrival date & time: 02/15/20  1631      History   Chief Complaint Chief Complaint  Patient presents with  . Foot Pain    HPI Mark Gonzalez. is a 59 y.o. male.   HPI Pt reports hx of back surgery Feb, had LLE nerve surgery in May for foot-drop and has been wearing a brace since, along with doing PT.   Over past 7-10 days, has been having left dorsal-lateral foot pain with progressive worsening.  He feels he may have over-exerted it while doing physical therapy, especially standing on a soft surface to help with his balance during PT. denies any other known trauma.  The left foot pain is worse with movement or standing or walking, improves with rest.  Denies any new leg swelling as he has mild bilateral chronic lower extremity edema associated with his morbid obesity.  Denies any redness of the lower extremity or any cords or knots felt in lower extremities.  Denies calf pain.  No fever or chills or nausea or vomiting or abdominal pain.  Denies chest pain or shortness of breath.  Denies cough or URI symptoms. Denies any urinary symptoms. Pertinent items noted in HPI and remainder of comprehensive ROS otherwise negative.  Past Medical History:  Diagnosis Date  . Atrial flutter (HCC)    a. s/p DCCV 08/16/2012.  . Cellulitis   . Cellulitis of leg, left 09/24/2015  . DVT (deep venous thrombosis) (HCC) 09/2014   "may have just been cellulitis"   . Dysrhythmia    afib  . GERD (gastroesophageal reflux disease)   . Hypercholesterolemia   . Hypertension   . Morbid obesity (HCC)   . NICM (nonischemic cardiomyopathy) (HCC)    a. EF 45-50% in setting of AF in past; b. Neg MV;  c. 04/2010 Echo: EF 55-60%;  d. 11/2011 Cath: nl cors.  . Paroxysmal atrial fibrillation (HCC)    a. s/p DCCV 09/2007, 11/2007, 04/2013 --> flecainide and chronic coumadin.  . Pneumonia ~ 1995  . Psoriasis    "leasions multiple places on my body; under control w/RX now"  (09/25/2015)  . Psoriatic arthritis (HCC)    "primarily in my hands" (09/25/2015)    Patient Active Problem List   Diagnosis Date Noted  . Lumbar radiculopathy, chronic 09/24/2019  . Lumbar spinal stenosis 07/09/2019  . AF (atrial fibrillation) (HCC) 09/25/2015  . Cellulitis of leg 09/24/2015  . Pericardial effusion 10/15/2013  . Pericarditis 10/11/2013  . Acute pericarditis, unspecified 10/01/2013  . Atrial flutter (HCC) 08/16/2012  . Wound, open, buttock with complication 08/17/2011  . HYPERLIPIDEMIA-MIXED 04/22/2010  . Morbid obesity (HCC) 05/15/2008  . Essential hypertension, benign 05/15/2008  . Atrial fibrillation (HCC) 05/15/2008    Past Surgical History:  Procedure Laterality Date  . ANTERIOR LAT LUMBAR FUSION Right 09/24/2019   Procedure: Right Lumbar Three-Four Lumbar Four-Five Anterolateral decompression/fusion/percutaneous pedicle screw fixation;  Surgeon: Barnett Abu, MD;  Location: [facility] OR;  Service: Neurosurgery;  Laterality: Right;  anterolateral  . APPENDECTOMY    . APPLICATION OF ROBOTIC ASSISTANCE FOR SPINAL PROCEDURE N/A 09/24/2019   Procedure: APPLICATION OF ROBOTIC ASSISTANCE FOR SPINAL PROCEDURE;  Surgeon: Barnett Abu, MD;  Location: [facility];  Service: Neurosurgery;  Laterality: N/A;  . ATRIAL FIBRILLATION ABLATION  07/2011  . BACK SURGERY    . CARDIAC CATHETERIZATION   11/2011  . CARDIOVERSION     "I've probably had 10" (09/25/2015)  . CARDIOVERSION  08/16/2012  Procedure: CARDIOVERSION;  Surgeon: Dolores Patty, MD;  Location: [facility] ENDOSCOPY;  Service: Cardiovascular;  Laterality: N/A;  . CARDIOVERSION N/A 06/05/2013   Procedure: CARDIOVERSION;  Surgeon: Dolores Patty, MD;  Location: [facility] OR;  Service: Cardiovascular;  Laterality: N/A;  . CARDIOVERSION N/A 07/11/2014   Procedure: CARDIOVERSION;  Surgeon: Dolores Patty, MD;  Location: [facility] ENDOSCOPY;  Service: Cardiovascular;  Laterality: N/A;  . COLONOSCOPY    . FRACTURE SURGERY    . LEFT HEART  CATHETERIZATION WITH CORONARY ANGIOGRAM N/A 12/22/2011   Procedure: LEFT HEART CATHETERIZATION WITH CORONARY ANGIOGRAM;  Surgeon: Dolores Patty, MD;  Location: [facility] CATH LAB;  Service: Cardiovascular;  Laterality: N/A;  . LUMBAR PERCUTANEOUS PEDICLE SCREW 2 LEVEL N/A 09/24/2019   Procedure: Introlateral Decompression Lumbar Three-Four with XLIFT Spacer, Arthrodesis with Allograft, Posterior Fixation Lumbar Three-Five with Percutaneous screws with Robotic Assistance. Laminectomy Lumbar Four-Five Posterior Lumbar Interbody Arthrodesis Unilateral Lumbar four-Five On Left, Posterior Lateral Arthrodesis Lumbar Four-Five Bilaterally.;  Surgeon: Barnett Abu, MD;  Location: [facility] OR  . ORIF METATARSAL FRACTURE  ~ 2001   "5th"  . PERONEAL NERVE DECOMPRESSION Left 12/05/2019   Procedure: Left PERONEAL NERVE DECOMPRESSION;  Surgeon: Barnett Abu, MD;  Location: [facility] OR;  Service: Neurosurgery;  Laterality: Left;  3C  . TONSILLECTOMY     Left peroneal nerve decompression 12/05/2019    Home Medications    Prior to Admission medications   Medication Sig Start Date End Date Taking? Authorizing Provider  apixaban (ELIQUIS) 5 MG TABS tablet TAKE 1 TABLET(5 MG) BY MOUTH TWICE DAILY Patient taking differently: Take 5 mg by mouth 2 (two) times daily.  08/26/19  Yes Bensimhon, Bevelyn Buckles, MD  b complex vitamins tablet Take 1 tablet by mouth daily.   Yes [provider]  calcium carbonate (TUMS - DOSED IN MG ELEMENTAL CALCIUM) 500 MG chewable tablet Chew 1 tablet by mouth daily as needed for indigestion or heartburn.   Yes [provider]  Cholecalciferol (VITAMIN D-3) 125 MCG (5000 UT) TABS Take 5,000 Units by mouth daily.   Yes [provider]  diclofenac (VOLTAREN) 75 MG EC tablet Take 75 mg by mouth daily.  09/17/15  Yes [provider]  dofetilide (TIKOSYN) 500 MCG capsule Take 1 capsule (500 mcg total) by mouth 2 (two) times daily. 08/26/19  Yes Bensimhon, Bevelyn Buckles, MD  furosemide  (LASIX) 40 MG tablet Take 1 tablet (40 mg total) by mouth daily. 11/27/19  Yes Bensimhon, Bevelyn Buckles, MD  golimumab (SIMPONI ARIA) 50 MG/4ML SOLN injection Inject into the vein every 8 (eight) weeks.    Yes [provider]  irbesartan (AVAPRO) 150 MG tablet Take 1 tablet (150 mg total) by mouth daily. Patient taking differently: Take 150 mg by mouth every evening.  08/19/19  Yes Bensimhon, Bevelyn Buckles, MD  loratadine (CLARITIN) 10 MG tablet Take 10 mg by mouth daily.   Yes [provider]  lovastatin (MEVACOR) 20 MG tablet TAKE 1 TABLET(20 MG) BY MOUTH AT BEDTIME Patient taking differently: Take 20 mg by mouth at bedtime.  08/26/19  Yes Bensimhon, Bevelyn Buckles, MD  Magnesium 400 MG CAPS Take 400 mg by mouth every evening.    Yes [provider]  Milk Thistle 250 MG CAPS Take 250 mg by mouth every evening.   Yes [provider]  Multiple Vitamin (MULTIVITAMIN WITH MINERALS) TABS tablet Take 1 tablet by mouth daily.   Yes [provider]  Omega-3 Fatty Acids (FISH OIL) 1200 MG CAPS Take  1,200 mg by mouth in the morning and at bedtime.   Yes [provider]  potassium chloride SA (KLOR-CON) 20 MEQ tablet Take 2 tablets (40 mEq total) by mouth daily. Patient taking differently: Take 40 mEq by mouth at bedtime.  11/27/19  Yes Bensimhon, Bevelyn Buckles, MD  Probiotic Product (PROBIOTIC PO) Take 1 capsule by mouth daily.    Yes [provider]  fluticasone (FLONASE) 50 MCG/ACT nasal spray Place 2 sprays into both nostrils daily. Patient taking differently: Place 2 sprays into both nostrils daily as needed for allergies.  05/28/19   Couture, Cortni S, PA-C  methocarbamol (ROBAXIN) 500 MG tablet Take 1 tablet (500 mg total) by mouth every 6 (six) hours as needed for muscle spasms. 09/27/19   Barnett Abu, MD  Multiple Vitamins-Minerals (PRESERVISION AREDS 2 PO) Take 1 tablet by mouth in the morning and at bedtime.    [provider]  traMADol (ULTRAM) 50  MG tablet Take 50 mg by mouth every 6 (six) hours as needed (for pain.).    [provider]    Family History Family History  Problem Relation Age of Onset  . Cancer Mother   . Cancer Father   . Heart disease Neg Hx     Social History Social History   Tobacco Use  . Smoking status: Former Smoker    Packs/day: 1.00    Years: 8.00    Pack years: 8.00    Types: Cigarettes    Quit date: 08/01/1990    Years since quitting: 29.5  . Smokeless tobacco: Never Used  Vaping Use  . Vaping Use: Some days  Substance Use Topics  . Alcohol use: Yes    Alcohol/week: 10.0 standard drinks    Types: 10 Shots of liquor per week    Comment: 10 per week  . Drug use: Yes    Types: Marijuana    Comment: Uses Delta 9 only     Allergies   Lisinopril   Review of Systems Review of Systems   Physical Exam Triage Vital Signs ED Triage Vitals  Enc Vitals Group     BP      Pulse      Resp      Temp      Temp src      SpO2      Weight      Height      Head Circumference      Peak Flow      Pain Score      Pain Loc      Pain Edu?      Excl. in GC?    No data found.  Updated Vital Signs BP (!) 143/84   Pulse (!) 55   Temp 97.9 F (36.6 C)   Resp 18   SpO2 99%    Physical Exam Vitals reviewed.  Constitutional: Pleasant, overweight male, cooperative.    General: He is not in acute distress.     He is well-developed.  HENT:     Head: Normocephalic and atraumatic.  Eyes:     General: No scleral icterus.    Pupils: Pupils are equal, round, and reactive to light.  Cardiovascular:     Regular rate Pulmonary:     Effort: Pulmonary effort is normal.  Lungs clear.  Equal expansion bilaterally. Abdominal:     General: There is no distension.  Musculoskeletal:     Cervical back: Normal range of motion and neck supple.  Left lower  extremities: Bilaterally, no calf tenderness or cords or heat.  Trace pretibial edema bilaterally.  Nontender over fib/tib and ankles  bilaterally. Left foot:  Partial left foot drop.  He wears a brace for the left foot drop. No deformity.  Tenderness over fifth metatarsal.  No open wound. Capillary refill distally intact bilaterally. Skin:    General: Skin is warm and dry.  No rash Neurological:     Mental Status: He is alert and oriented to person, place, and time.     Cranial Nerves: No cranial nerve deficit.  Psychiatric:        Behavior: Behavior normal.    UC Treatments / Results  Labs (all labs ordered are listed, but only abnormal results are displayed) Labs Reviewed - No data to display  EKG   Radiology DG Foot Complete Left  Result Date: 02/15/2020 CLINICAL DATA:  Left foot pain laterally, increased over the past 2 days. Currently in rehab for back surgery. The patient has foot drop. EXAM: LEFT FOOT - COMPLETE 3+ VIEW COMPARISON:  None. FINDINGS: Mild 1st MTP joint degenerative spur formation. Mild dorsal tarsal spur formation. Moderate inferior and mild posterior calcaneal spur formation. There is mild flattening of the normal plantar arch. No fracture or dislocation seen. IMPRESSION: Degenerative changes, as described above.  No fracture. Electronically Signed   By: Beckie Salts M.D.   On: 02/15/2020 19:06    Procedures Procedures (including critical care time)  Medications Ordered in UC Medications - No data to display  Initial Impression / Assessment and Plan / UC Course  I have reviewed the triage vital signs and the nursing notes.  Pertinent labs & imaging results that were available during my care of the patient were reviewed by me and considered in my medical decision making (see chart for details).      Final Clinical Impressions(s) / UC Diagnoses   Final diagnoses:  Foot pain, left  Sprain of left foot, initial encounter     Discharge Instructions     X-ray shows no fracture of left foot.  You likely have a sprain of the ligaments and intrinsic muscles of the left foot, from  vigorous physical therapy recently, after year perineal/foot drop surgery few months ago. Tylenol if needed for pain. (I know you declined any other pain Rx) continue wearing the foot drop brace/boot that you have.  Follow-up with your physical therapist, orthopedist and neurosurgeon.  Also follow-up with Dr.Koriala,your PCP. - No sign of any acute left ankle injury on physical exam.  No sign of DVT on physical exam today.    Follow-up with your doctor in 5-7 days if not improving, or sooner if symptoms become worse. Precautions discussed. Red flags discussed. Questions invited and answered. Patient voiced understanding and agreement.    Lajean Manes, MD 02/17/20 [phone]

## 2020-02-21 DIAGNOSIS — M48062 Spinal stenosis, lumbar region with neurogenic claudication: Secondary | ICD-10-CM | POA: Diagnosis not present

## 2020-02-21 DIAGNOSIS — R269 Unspecified abnormalities of gait and mobility: Secondary | ICD-10-CM | POA: Diagnosis not present

## 2020-02-22 ENCOUNTER — Other Ambulatory Visit (HOSPITAL_COMMUNITY): Payer: Self-pay | Admitting: Internal Medicine

## 2020-02-26 ENCOUNTER — Other Ambulatory Visit (HOSPITAL_COMMUNITY): Payer: Self-pay

## 2020-02-26 ENCOUNTER — Encounter (HOSPITAL_COMMUNITY): Payer: BC Managed Care – PPO | Admitting: Internal Medicine

## 2020-02-26 MED ORDER — DOFETILIDE 500 MCG PO CAPS: 500.0000 ug | ORAL_CAPSULE | Freq: Two times a day (BID) | ORAL | 3 refills | Status: DC

## 2020-02-27 DIAGNOSIS — R269 Unspecified abnormalities of gait and mobility: Secondary | ICD-10-CM | POA: Diagnosis not present

## 2020-02-27 DIAGNOSIS — M48062 Spinal stenosis, lumbar region with neurogenic claudication: Secondary | ICD-10-CM | POA: Diagnosis not present

## 2020-02-28 DIAGNOSIS — M21372 Foot drop, left foot: Secondary | ICD-10-CM | POA: Diagnosis not present

## 2020-03-06 DIAGNOSIS — R269 Unspecified abnormalities of gait and mobility: Secondary | ICD-10-CM | POA: Diagnosis not present

## 2020-03-06 DIAGNOSIS — M48062 Spinal stenosis, lumbar region with neurogenic claudication: Secondary | ICD-10-CM | POA: Diagnosis not present

## 2020-03-13 DIAGNOSIS — R269 Unspecified abnormalities of gait and mobility: Secondary | ICD-10-CM | POA: Diagnosis not present

## 2020-03-13 DIAGNOSIS — M48062 Spinal stenosis, lumbar region with neurogenic claudication: Secondary | ICD-10-CM | POA: Diagnosis not present

## 2020-03-26 DIAGNOSIS — M48062 Spinal stenosis, lumbar region with neurogenic claudication: Secondary | ICD-10-CM | POA: Diagnosis not present

## 2020-03-26 DIAGNOSIS — R269 Unspecified abnormalities of gait and mobility: Secondary | ICD-10-CM | POA: Diagnosis not present

## 2020-04-02 ENCOUNTER — Other Ambulatory Visit (HOSPITAL_COMMUNITY): Payer: Self-pay | Admitting: Internal Medicine

## 2020-04-02 DIAGNOSIS — L4050 Arthropathic psoriasis, unspecified: Secondary | ICD-10-CM | POA: Diagnosis not present

## 2020-04-07 ENCOUNTER — Ambulatory Visit: Payer: BC Managed Care – PPO | Admitting: Sports Medicine

## 2020-04-07 ENCOUNTER — Other Ambulatory Visit: Payer: Self-pay

## 2020-04-07 VITALS — BP 122/74 | Ht 72.0 in | Wt 341.0 lb

## 2020-04-07 DIAGNOSIS — M25559 Pain in unspecified hip: Secondary | ICD-10-CM | POA: Diagnosis not present

## 2020-04-08 ENCOUNTER — Encounter: Payer: Self-pay | Admitting: Sports Medicine

## 2020-04-08 NOTE — Progress Notes (Signed)
   Subjective:    Patient ID: Mark Gonzalez., male    DOB: 05-Mar-1961, 59 y.o.   MRN: [id number]  HPI    Review of Systems     Objective:   Physical Exam        Assessment & Plan:

## 2020-04-08 NOTE — Progress Notes (Signed)
   Subjective:    Patient ID: Mark Gonzalez., male    DOB: [birth year], 59 y.o.   MRN: [id number]  HPI CC: Left hip pain  Pt with 2 weeks of posterior lateral left hip pain. No injury.S/P Lumbar spine fusion and left peroneal nerve decompression earlier this year with resultant foot drop. Pain most noticeable with getting in and out of his truck. He also experiences occasional sharp pain with walking. No groin pain. He does have pain at night when sleeping on his left side.    Review of Systems    as above Objective:   Physical Exam  Left Hip: Full, painless ROM. TTP at the priformis as well as at the insertion of gluteus medius. Markedly positive trendelenberg. AFO in place distally for his foot drop      Assessment & Plan:   Left hip pain secondary to pelvis stabilizer weakness and possible gluteus medius tendinopathy  Altered gait secondary to foot drop is likely a contributing factor.  PT with Ellamae Sia RTC in 4 weeks if still symptomatic

## 2020-04-14 DIAGNOSIS — R269 Unspecified abnormalities of gait and mobility: Secondary | ICD-10-CM | POA: Diagnosis not present

## 2020-04-14 DIAGNOSIS — M48062 Spinal stenosis, lumbar region with neurogenic claudication: Secondary | ICD-10-CM | POA: Diagnosis not present

## 2020-04-14 DIAGNOSIS — M25552 Pain in left hip: Secondary | ICD-10-CM | POA: Diagnosis not present

## 2020-05-01 ENCOUNTER — Ambulatory Visit (HOSPITAL_COMMUNITY)
Admission: RE | Admit: 2020-05-01 | Discharge: 2020-05-01 | Disposition: A | Discharge status: Home / Self Care | Payer: BC Managed Care – PPO | Source: Ambulatory Visit | Attending: Internal Medicine | Admitting: Internal Medicine

## 2020-05-01 ENCOUNTER — Other Ambulatory Visit: Payer: Self-pay

## 2020-05-01 ENCOUNTER — Encounter (HOSPITAL_COMMUNITY): Payer: Self-pay | Admitting: Internal Medicine

## 2020-05-01 VITALS — BP 138/82 | HR 62 | Ht 72.0 in | Wt 337.6 lb

## 2020-05-01 DIAGNOSIS — I10 Essential (primary) hypertension: Secondary | ICD-10-CM | POA: Insufficient documentation

## 2020-05-01 DIAGNOSIS — I872 Venous insufficiency (chronic) (peripheral): Secondary | ICD-10-CM | POA: Insufficient documentation

## 2020-05-01 DIAGNOSIS — L4050 Arthropathic psoriasis, unspecified: Secondary | ICD-10-CM | POA: Diagnosis not present

## 2020-05-01 DIAGNOSIS — Z791 Long term (current) use of non-steroidal anti-inflammatories (NSAID): Secondary | ICD-10-CM | POA: Insufficient documentation

## 2020-05-01 DIAGNOSIS — I5032 Chronic diastolic (congestive) heart failure: Secondary | ICD-10-CM | POA: Diagnosis not present

## 2020-05-01 DIAGNOSIS — R600 Localized edema: Secondary | ICD-10-CM | POA: Diagnosis not present

## 2020-05-01 DIAGNOSIS — Z79899 Other long term (current) drug therapy: Secondary | ICD-10-CM | POA: Insufficient documentation

## 2020-05-01 DIAGNOSIS — I480 Paroxysmal atrial fibrillation: Secondary | ICD-10-CM | POA: Insufficient documentation

## 2020-05-01 DIAGNOSIS — M21372 Foot drop, left foot: Secondary | ICD-10-CM | POA: Insufficient documentation

## 2020-05-01 DIAGNOSIS — Z7901 Long term (current) use of anticoagulants: Secondary | ICD-10-CM | POA: Insufficient documentation

## 2020-05-01 DIAGNOSIS — E785 Hyperlipidemia, unspecified: Secondary | ICD-10-CM | POA: Insufficient documentation

## 2020-05-01 NOTE — Addendum Note (Signed)
Encounter addended by: Samara Snide, RN on: 05/01/2020 3:35 PM  Actions taken: Clinical Note Signed

## 2020-05-01 NOTE — Patient Instructions (Signed)
It was good to see you!  Please call our office in June 2022 to schedule your follow up appointment  If you have any questions or concerns before your next appointment please send [place] a message through [place] or call our office at [phone].    TO LEAVE A MESSAGE FOR THE NURSE SELECT OPTION 2, PLEASE LEAVE A MESSAGE INCLUDING: . YOUR NAME . DATE OF BIRTH . CALL BACK NUMBER . REASON FOR CALL**this is important as we prioritize the call backs  YOU WILL RECEIVE A CALL BACK THE SAME DAY AS LONG AS YOU CALL BEFORE 4:00 PM

## 2020-05-01 NOTE — Progress Notes (Signed)
Patient ID: Mark Gonzalez., male   DOB: February 18, 1961, 59 y.o.   MRN: [id number] . Advanced Heart Failure Clinic Note  PCP: Koirala, Dibas, MD  Subjective:    Mark Gonzalez is a very pleasant 59 y/o male with a history of psoriatic arthritis, obesity, hypertension, and atrial fibrillation.   Failed flecainide and now maintained in NSR on Tikosyn with intermittent breakthrough.   Admitted several times in March 2015 with acute pericarditis. Treated with colchicine and steroids. Had recurrence and readmitted. Echo with moderate pericardial effusion. Started on prednisone 60 daily. Effusion resolved after 1 week. Remicade stopped as it was thought that it might be causative. Has not recurred.  Previously was working with [facility] and lost a lot of weight. On 09/24/19 had spinal surgery which went well except for LE edema. Now had developed peroneal neuropathy with left foot drop. Had peroneal nerve release in 5/21. Developed RLE edema subsequently.    Here for f/u. Feels good. Denies SOB, palpitations, orthopnea or PND. No bleeding on Eliquis. Wears compression socks occasionally. Was walking a couple of miles per day. But developed ankle and hip issues due to compensation issues. Now using seated elliptical machine. Trying to get back on his bike.    Current Outpatient Medications on File Prior to Encounter  Medication Sig Dispense Refill  . b complex vitamins tablet Take 1 tablet by mouth daily.    . Cholecalciferol (VITAMIN D-3) 125 MCG (5000 UT) TABS Take 5,000 Units by mouth daily.    . diclofenac (VOLTAREN) 75 MG EC tablet Take 75 mg by mouth daily.   0  . dofetilide (TIKOSYN) 500 MCG capsule Take 1 capsule (500 mcg total) by mouth 2 (two) times daily. 60 capsule 3  . ELIQUIS 5 MG TABS tablet TAKE 1 TABLET(5 MG) BY MOUTH TWICE DAILY 60 tablet 5  . furosemide (LASIX) 40 MG tablet Take 1 tablet (40 mg total) by mouth daily. 30 tablet 5  . golimumab (SIMPONI ARIA) 50 MG/4ML SOLN injection Inject  into the vein every 8 (eight) weeks.     . irbesartan (AVAPRO) 150 MG tablet Take 1 tablet (150 mg total) by mouth daily. (Patient taking differently: Take 150 mg by mouth every evening. ) 30 tablet 11  . loratadine (CLARITIN) 10 MG tablet Take 10 mg by mouth daily.    Marland Kitchen lovastatin (MEVACOR) 20 MG tablet TAKE 1 TABLET(20 MG) BY MOUTH AT BEDTIME 30 tablet 5  . Magnesium 400 MG CAPS Take 400 mg by mouth every evening.     . Milk Thistle 250 MG CAPS Take 250 mg by mouth every evening.    . Multiple Vitamin (MULTIVITAMIN WITH MINERALS) TABS tablet Take 1 tablet by mouth daily.    . Multiple Vitamins-Minerals (PRESERVISION AREDS 2 PO) Take 1 tablet by mouth in the morning and at bedtime.    . Omega-3 Fatty Acids (FISH OIL) 1200 MG CAPS Take 1,200 mg by mouth in the morning and at bedtime.    . potassium chloride SA (KLOR-CON) 20 MEQ tablet TAKE 2 TABLETS(40 MEQ) BY MOUTH DAILY 60 tablet 3  . Probiotic Product (PROBIOTIC PO) Take 1 capsule by mouth daily.     . calcium carbonate (TUMS - DOSED IN MG ELEMENTAL CALCIUM) 500 MG chewable tablet Chew 1 tablet by mouth daily as needed for indigestion or heartburn. (Patient not taking: Reported on 05/01/2020)    . fluticasone (FLONASE) 50 MCG/ACT nasal spray Place 2 sprays into both nostrils daily. (Patient not taking:  Reported on 05/01/2020) 16 g 0  . methocarbamol (ROBAXIN) 500 MG tablet Take 1 tablet (500 mg total) by mouth every 6 (six) hours as needed for muscle spasms. (Patient not taking: Reported on 05/01/2020) 40 tablet 3  . traMADol (ULTRAM) 50 MG tablet Take 50 mg by mouth every 6 (six) hours as needed (for pain.). (Patient not taking: Reported on 05/01/2020)     No current facility-administered medications on file prior to encounter.    Objective:       Weight change: Vitals:   05/01/20 1439  BP: 138/82  Pulse: 62  SpO2: 97%  Weight: (!) 153.1 kg (337 lb 9.6 oz)  Height: 6' (1.829 m)   Filed Weights   05/01/20 1439  Weight: (!) 153.1 kg  (337 lb 9.6 oz)   Wt Readings from Last 3 Encounters:  05/01/20 (!) 153.1 kg (337 lb 9.6 oz)  04/07/20 (!) 154.7 kg (341 lb)  12/05/19 (!) 155.1 kg (342 lb)    Physical Exam: General:  Well appearing. No resp difficulty HEENT: normal Neck: supple. no JVD. Carotids 2+ bilat; no bruits. No lymphadenopathy or thryomegaly appreciated. Cor: PMI nondisplaced. Regular rate & rhythm. No rubs, gallops or murmurs. Lungs: clear Abdomen: obese soft, nontender, nondistended. No hepatosplenomegaly. No bruits or masses. Good bowel sounds. Extremities: no cyanosis, clubbing, rash, 1+ edema LLE brace Neuro: alert & orientedx3, cranial nerves grossly intact. moves all 4 extremities w/o difficulty. Affect pleasant   Assessment/Plan:    1. PAF in NSR on Tikosyn - intermittent breakthroughs  -s/p previous AF ablation at [facility]   - AF well controlled on dofetilide. Remains in NSR  -Continue eliquis. No bleeding 2. Morbid obesity    --Had lost a lot of weight with [employer] and now has gained some back 3. Psoriatic Arthritis  - Follows with Dr. Dierdre Forth 4. HTN  - Blood pressure well controlled. Continue current regimen. 5. H/o pericarditis felt to be related to Remicade 2015 6. Hyperlipidemia - Followed by PCP 7. LE edema - due to venous insufficiency - wear compression hose as needed   Arvilla Meres, MD  3:13 PM

## 2020-05-07 ENCOUNTER — Ambulatory Visit: Payer: BC Managed Care – PPO | Admitting: Sports Medicine

## 2020-05-13 DIAGNOSIS — M21372 Foot drop, left foot: Secondary | ICD-10-CM | POA: Diagnosis not present

## 2020-05-21 ENCOUNTER — Other Ambulatory Visit (HOSPITAL_COMMUNITY): Payer: Self-pay | Admitting: Internal Medicine

## 2020-05-28 ENCOUNTER — Telehealth (HOSPITAL_COMMUNITY): Payer: Self-pay | Admitting: *Deleted

## 2020-05-28 DIAGNOSIS — L4050 Arthropathic psoriasis, unspecified: Secondary | ICD-10-CM | POA: Diagnosis not present

## 2020-05-28 DIAGNOSIS — Z79899 Other long term (current) drug therapy: Secondary | ICD-10-CM | POA: Diagnosis not present

## 2020-05-28 NOTE — Telephone Encounter (Signed)
Received fax from [facility], pt needs clearance for lumbar myelogram w/post myelogram ct and clearance to hold eliquis  Per Dr Gala Romney: "Low Risk, ok to hold eliquis 3 days before surgery"  Note faxed back to them at [phone] atten Shanda Bumps

## 2020-05-29 ENCOUNTER — Other Ambulatory Visit: Payer: Self-pay | Admitting: Neurological Surgery

## 2020-05-29 DIAGNOSIS — M21372 Foot drop, left foot: Secondary | ICD-10-CM

## 2020-06-02 DIAGNOSIS — I32 Pericarditis in diseases classified elsewhere: Secondary | ICD-10-CM | POA: Diagnosis not present

## 2020-06-02 DIAGNOSIS — L401 Generalized pustular psoriasis: Secondary | ICD-10-CM | POA: Diagnosis not present

## 2020-06-02 DIAGNOSIS — L4050 Arthropathic psoriasis, unspecified: Secondary | ICD-10-CM | POA: Diagnosis not present

## 2020-06-02 DIAGNOSIS — L03115 Cellulitis of right lower limb: Secondary | ICD-10-CM | POA: Diagnosis not present

## 2020-06-21 ENCOUNTER — Other Ambulatory Visit (HOSPITAL_COMMUNITY): Payer: Self-pay | Admitting: Internal Medicine

## 2020-06-22 ENCOUNTER — Ambulatory Visit (HOSPITAL_COMMUNITY)
Admission: RE | Admit: 2020-06-22 | Discharge: 2020-06-22 | Disposition: A | Discharge status: Home / Self Care | Payer: BC Managed Care – PPO | Source: Ambulatory Visit | Attending: Neurological Surgery | Admitting: Neurological Surgery

## 2020-06-22 ENCOUNTER — Other Ambulatory Visit: Payer: Self-pay

## 2020-06-22 DIAGNOSIS — M21372 Foot drop, left foot: Secondary | ICD-10-CM

## 2020-06-22 DIAGNOSIS — S33140A Subluxation of L4/L5 lumbar vertebra, initial encounter: Secondary | ICD-10-CM | POA: Diagnosis not present

## 2020-06-22 DIAGNOSIS — I700 Atherosclerosis of aorta: Secondary | ICD-10-CM | POA: Insufficient documentation

## 2020-06-22 DIAGNOSIS — K8020 Calculus of gallbladder without cholecystitis without obstruction: Secondary | ICD-10-CM | POA: Diagnosis not present

## 2020-06-22 DIAGNOSIS — M5416 Radiculopathy, lumbar region: Secondary | ICD-10-CM | POA: Diagnosis not present

## 2020-06-22 DIAGNOSIS — M4316 Spondylolisthesis, lumbar region: Secondary | ICD-10-CM | POA: Diagnosis not present

## 2020-06-22 DIAGNOSIS — M48061 Spinal stenosis, lumbar region without neurogenic claudication: Secondary | ICD-10-CM | POA: Diagnosis not present

## 2020-06-22 DIAGNOSIS — Z981 Arthrodesis status: Secondary | ICD-10-CM | POA: Diagnosis not present

## 2020-06-22 DIAGNOSIS — M4807 Spinal stenosis, lumbosacral region: Secondary | ICD-10-CM | POA: Diagnosis not present

## 2020-06-22 MED ORDER — DIAZEPAM 5 MG PO TABS
ORAL_TABLET | ORAL | Status: AC
  Administered 2020-06-22: 10 mg
  Filled 2020-06-22: qty 2

## 2020-06-22 MED ORDER — LIDOCAINE HCL (PF) 1 % IJ SOLN: 5.0000 mL | Freq: Once | INTRAMUSCULAR | Status: DC

## 2020-06-22 MED ORDER — ONDANSETRON HCL 4 MG/2ML IJ SOLN: 4.0000 mg | Freq: Four times a day (QID) | INTRAMUSCULAR | Status: DC | PRN

## 2020-06-22 MED ORDER — OXYCODONE-ACETAMINOPHEN 5-325 MG PO TABS
1.0000 | ORAL_TABLET | ORAL | Status: DC | PRN
  Administered 2020-06-22: 1 via ORAL

## 2020-06-22 MED ORDER — OXYCODONE-ACETAMINOPHEN 5-325 MG PO TABS
ORAL_TABLET | ORAL | Status: AC
  Filled 2020-06-22: qty 1

## 2020-06-22 MED ORDER — IOHEXOL 180 MG/ML  SOLN
20.0000 mL | Freq: Once | INTRAMUSCULAR | Status: AC | PRN
  Administered 2020-06-22: 9 mL via INTRATHECAL

## 2020-06-22 NOTE — Procedures (Signed)
[name] is a 59 year old individual who underwent surgical decompression and stabilization using an indirect technique a couple of years ago.  He developed a left lumbar radiculopathy with a foot drop this has persisted.  Further evaluation is now being performed with a myelogram and a post myelogram CAT scan as he has had continued radicular symptoms in addition to worsening difficulty with walking on his feet.  Pre op Dx: Lumbar radiculopathy, neurogenic claudication, history of lumbar fusion. Post op Dx: Same Procedure: Lumbar myelogram Surgeon: Rileyann Florance Puncture level: L2-L3 Fluid color: Clear colorless Injection: Isovue 180, 9 mL Findings: Persistent stenosis L3-4 to lesser extent L4-5, root cut off L5 extraforaminal likely, further evaluation with CT scanning.

## 2020-06-22 NOTE — Discharge Instructions (Signed)
Myelogram  A myelogram is an imaging test. This test checks for problems in the spinal cord and the places where nerves attach to the spinal cord (nerve roots). A dye (contrast material) is put into your spine before the X-ray. This provides a clearer image for your doctor to see. You may need this test if you have a spinal cord problem that cannot be diagnosed with other imaging tests. You may also have this test to check your spine after surgery. Tell a doctor about:  Any allergies you have, especially to iodine.  All medicines you are taking, including vitamins, herbs, eye drops, creams, and over-the-counter medicines.  Any problems you or family members have had with anesthetic medicines or dye.  Any blood disorders you have.  Any surgeries you have had.  Any medical conditions you have or have had, including asthma.  Whether you are pregnant or may be pregnant. What are the risks? Generally, this is a safe procedure. However, problems may occur, including:  Infection.  Bleeding.  Allergic reaction to medicines or dyes.  Damage to your spinal cord or nerves.  Leaking of spinal fluid. This can cause a headache.  Damage to kidneys.  Seizures. This is rare. What happens before the procedure?  Follow instructions from your doctor about what you cannot eat or drink. You may be asked to drink more fluids.  Ask your doctor about changing or stopping your normal medicines. This is important if you take diabetes medicines or blood thinners.  Plan to have someone take you home from the hospital or clinic.  If you will be going home right after the procedure, plan to have someone with you for 24 hours. What happens during the procedure?  You will lie face down on a table.  Your doctor will find the best injection site on your spine. This is most often in the lower back.  This area will be washed with soap.  You will be given a medicine to numb the area (local  anesthetic).  Your doctor will place a long needle into the space around your spinal cord.  A sample of spinal fluid may be taken. This may be sent to the lab for testing.  The dye will be injected into the space around your spinal cord.  The exam table may be tilted. This helps the dye flow up or down your spine.  The X-ray will take images of your spinal cord.  A bandage (dressing) may be placed over the area where the dye was injected. The procedure may vary among doctors and hospitals. What can I expect after the procedure?  You may be monitored until you leave the hospital or clinic. This includes checking your blood pressure, heart rate, breathing rate, and blood oxygen level.  You may feel sore at the injection site. You may have a mild headache.  You will be told to lie flat with your head raised (elevated). This lowers the risk of a headache.  It is up to you to get the results of your procedure. Ask your doctor, or the department that is doing the procedure, when your results will be ready. Follow these instructions at home:   Rest as told by your doctor. Lie flat with your head slightly elevated.  Do not bend, lift, or do hard work for 24-48 hours, or as told by your doctor.  Take over-the-counter and prescription medicines only as told by your doctor.  Take care of your bandage as told by your   doctor.  Drink enough fluid to keep your pee (urine) pale yellow.  Bathe or shower as told by your doctor. Contact a doctor if:  You have a fever.  You have a headache that lasts longer than 24 hours.  You feel sick to your stomach (nauseous).  You vomit.  Your neck is stiff.  Your legs feel numb.  You cannot pee.  You cannot poop (no bowel movement).  You have a rash.  You are itchy or sneezing. Get help right away if:  You have new symptoms or your symptoms get worse.  You have a seizure.  You have trouble breathing. Summary  A myelogram is an  imaging test that checks for problems in the spinal cord and the places where nerves attach to the spinal cord (nerve roots).  Before the procedure, follow instructions from your doctor. You will be told what not to eat or drink, or what medicines to change or stop.  After the procedure, you will be told to lie flat with your head raised (elevated). This will lower your risk of a headache.  Do not bend, lift, or do any hard work for 24-48 hours, or as told by your doctor.  Contact a doctor if you have a stiff neck or numb legs. Get help right away if your symptoms get worse, or you have a seizure or trouble breathing. This information is not intended to replace advice given to you by your health care provider. Make sure you discuss any questions you have with your health care provider. Document Revised: 09/26/2018 Document Reviewed: 09/27/2018 Elsevier Patient Education  2020 Elsevier Inc.  

## 2020-06-22 NOTE — Progress Notes (Signed)
Patient was given discharge instructions. He verbalized understanding. 

## 2020-06-22 NOTE — Progress Notes (Signed)
Patient given pain medication for 5/10 pain in the lower back, bilateral buttocks and radiating pain to left leg. Describes as pressure in nature.

## 2020-07-01 DIAGNOSIS — Z6841 Body Mass Index (BMI) 40.0 and over, adult: Secondary | ICD-10-CM | POA: Diagnosis not present

## 2020-07-01 DIAGNOSIS — M21372 Foot drop, left foot: Secondary | ICD-10-CM | POA: Diagnosis not present

## 2020-07-01 DIAGNOSIS — I10 Essential (primary) hypertension: Secondary | ICD-10-CM | POA: Diagnosis not present

## 2020-07-29 DIAGNOSIS — L4050 Arthropathic psoriasis, unspecified: Secondary | ICD-10-CM | POA: Diagnosis not present

## 2020-08-08 ENCOUNTER — Other Ambulatory Visit (HOSPITAL_COMMUNITY): Payer: Self-pay | Admitting: Internal Medicine

## 2020-08-27 ENCOUNTER — Other Ambulatory Visit (HOSPITAL_COMMUNITY): Payer: Self-pay | Admitting: Internal Medicine

## 2020-09-08 ENCOUNTER — Telehealth: Payer: BC Managed Care – PPO | Admitting: Physician Assistant

## 2020-09-08 DIAGNOSIS — J019 Acute sinusitis, unspecified: Secondary | ICD-10-CM

## 2020-09-08 DIAGNOSIS — B9689 Other specified bacterial agents as the cause of diseases classified elsewhere: Secondary | ICD-10-CM | POA: Diagnosis not present

## 2020-09-08 DIAGNOSIS — U099 Post covid-19 condition, unspecified: Secondary | ICD-10-CM | POA: Diagnosis not present

## 2020-09-08 MED ORDER — AMOXICILLIN-POT CLAVULANATE 875-125 MG PO TABS: 1.0000 | ORAL_TABLET | Freq: Two times a day (BID) | ORAL | 0 refills | Status: DC

## 2020-09-08 NOTE — Progress Notes (Signed)

## 2020-09-08 NOTE — Progress Notes (Signed)
I have spent 5 minutes in review of e-visit questionnaire, review and updating patient chart, medical decision making and response to patient.   Travonne Schowalter Cody Jesten Cappuccio, PA-C    

## 2020-09-23 DIAGNOSIS — L4050 Arthropathic psoriasis, unspecified: Secondary | ICD-10-CM | POA: Diagnosis not present

## 2020-09-30 DIAGNOSIS — Z6841 Body Mass Index (BMI) 40.0 and over, adult: Secondary | ICD-10-CM | POA: Diagnosis not present

## 2020-09-30 DIAGNOSIS — M48062 Spinal stenosis, lumbar region with neurogenic claudication: Secondary | ICD-10-CM | POA: Diagnosis not present

## 2020-09-30 DIAGNOSIS — I10 Essential (primary) hypertension: Secondary | ICD-10-CM | POA: Diagnosis not present

## 2020-10-26 ENCOUNTER — Other Ambulatory Visit (HOSPITAL_COMMUNITY): Payer: Self-pay | Admitting: Internal Medicine

## 2020-11-05 DIAGNOSIS — M25552 Pain in left hip: Secondary | ICD-10-CM | POA: Diagnosis not present

## 2020-11-05 DIAGNOSIS — R269 Unspecified abnormalities of gait and mobility: Secondary | ICD-10-CM | POA: Diagnosis not present

## 2020-11-05 DIAGNOSIS — M48062 Spinal stenosis, lumbar region with neurogenic claudication: Secondary | ICD-10-CM | POA: Diagnosis not present

## 2020-11-16 DIAGNOSIS — M25552 Pain in left hip: Secondary | ICD-10-CM | POA: Diagnosis not present

## 2020-11-16 DIAGNOSIS — R269 Unspecified abnormalities of gait and mobility: Secondary | ICD-10-CM | POA: Diagnosis not present

## 2020-11-16 DIAGNOSIS — M48062 Spinal stenosis, lumbar region with neurogenic claudication: Secondary | ICD-10-CM | POA: Diagnosis not present

## 2020-11-18 DIAGNOSIS — Z79899 Other long term (current) drug therapy: Secondary | ICD-10-CM | POA: Diagnosis not present

## 2020-11-18 DIAGNOSIS — L4050 Arthropathic psoriasis, unspecified: Secondary | ICD-10-CM | POA: Diagnosis not present

## 2020-11-20 DIAGNOSIS — M170 Bilateral primary osteoarthritis of knee: Secondary | ICD-10-CM | POA: Diagnosis not present

## 2020-11-25 ENCOUNTER — Telehealth (HOSPITAL_COMMUNITY): Payer: Self-pay | Admitting: *Deleted

## 2020-11-25 NOTE — Telephone Encounter (Addendum)
Pt left VM stating he has been in afib since Monday and wondered if he needed another cardioversion or office visit. Dinah Beers aware and will follow up with Dr.Bensimhon.

## 2020-11-25 NOTE — Telephone Encounter (Signed)
Per Dr.Bensimhon schedule pt for DCCV next Friday. I called pt and pt declined DCCV. Pt said he is back in rhythm and will continue to monitor. Pt said he feels much better. I offered pt a nurse visit to check ekg. Pt declined and will continue to monitor at home. Heather Schub,RN aware.

## 2020-11-30 DIAGNOSIS — L401 Generalized pustular psoriasis: Secondary | ICD-10-CM | POA: Diagnosis not present

## 2020-11-30 DIAGNOSIS — L4050 Arthropathic psoriasis, unspecified: Secondary | ICD-10-CM | POA: Diagnosis not present

## 2020-11-30 DIAGNOSIS — I32 Pericarditis in diseases classified elsewhere: Secondary | ICD-10-CM | POA: Diagnosis not present

## 2020-11-30 DIAGNOSIS — L03115 Cellulitis of right lower limb: Secondary | ICD-10-CM | POA: Diagnosis not present

## 2020-12-02 DIAGNOSIS — M48062 Spinal stenosis, lumbar region with neurogenic claudication: Secondary | ICD-10-CM | POA: Diagnosis not present

## 2020-12-02 DIAGNOSIS — R269 Unspecified abnormalities of gait and mobility: Secondary | ICD-10-CM | POA: Diagnosis not present

## 2020-12-02 DIAGNOSIS — M25552 Pain in left hip: Secondary | ICD-10-CM | POA: Diagnosis not present

## 2020-12-21 DIAGNOSIS — R269 Unspecified abnormalities of gait and mobility: Secondary | ICD-10-CM | POA: Diagnosis not present

## 2020-12-21 DIAGNOSIS — M25552 Pain in left hip: Secondary | ICD-10-CM | POA: Diagnosis not present

## 2020-12-21 DIAGNOSIS — M48062 Spinal stenosis, lumbar region with neurogenic claudication: Secondary | ICD-10-CM | POA: Diagnosis not present

## 2020-12-25 ENCOUNTER — Telehealth (HOSPITAL_COMMUNITY): Payer: Self-pay | Admitting: *Deleted

## 2020-12-25 NOTE — Telephone Encounter (Signed)
Pt called stating his resting heart rate has been lower upper 40's lower 50's.  Pt said he stopped taking nexium and had really bad heart burn he restarted nexium and feels better but since then hes experienced a "dull feeling" near his left clavicle. Pt said the feeling is similar to when he had pericarditis. Pt said its not chest pain but it is concerning. Pt denies shortness of breath or dizziness but said hes been more fatigued the last few days with the lower heart rate. Pt asked that I send all of this to Dr.Bensimhon and get his advice.  Routed to Dr.Bensimhon

## 2020-12-31 DIAGNOSIS — R269 Unspecified abnormalities of gait and mobility: Secondary | ICD-10-CM | POA: Diagnosis not present

## 2020-12-31 DIAGNOSIS — M25552 Pain in left hip: Secondary | ICD-10-CM | POA: Diagnosis not present

## 2020-12-31 DIAGNOSIS — M48062 Spinal stenosis, lumbar region with neurogenic claudication: Secondary | ICD-10-CM | POA: Diagnosis not present

## 2021-01-13 DIAGNOSIS — L4050 Arthropathic psoriasis, unspecified: Secondary | ICD-10-CM | POA: Diagnosis not present

## 2021-02-20 ENCOUNTER — Other Ambulatory Visit (HOSPITAL_COMMUNITY): Payer: Self-pay | Admitting: Internal Medicine

## 2021-02-21 ENCOUNTER — Other Ambulatory Visit (HOSPITAL_COMMUNITY): Payer: Self-pay | Admitting: Internal Medicine

## 2021-03-01 ENCOUNTER — Ambulatory Visit (HOSPITAL_COMMUNITY)
Admission: RE | Admit: 2021-03-01 | Discharge: 2021-03-01 | Disposition: A | Discharge status: Home / Self Care | Payer: BC Managed Care – PPO | Source: Ambulatory Visit | Attending: Cardiology | Admitting: Cardiology

## 2021-03-01 ENCOUNTER — Other Ambulatory Visit: Payer: Self-pay

## 2021-03-01 ENCOUNTER — Encounter (HOSPITAL_COMMUNITY): Payer: Self-pay | Admitting: Internal Medicine

## 2021-03-01 VITALS — BP 100/68 | HR 57 | Wt 334.6 lb

## 2021-03-01 DIAGNOSIS — E785 Hyperlipidemia, unspecified: Secondary | ICD-10-CM | POA: Insufficient documentation

## 2021-03-01 DIAGNOSIS — Z7901 Long term (current) use of anticoagulants: Secondary | ICD-10-CM | POA: Diagnosis not present

## 2021-03-01 DIAGNOSIS — L4050 Arthropathic psoriasis, unspecified: Secondary | ICD-10-CM | POA: Insufficient documentation

## 2021-03-01 DIAGNOSIS — Z791 Long term (current) use of non-steroidal anti-inflammatories (NSAID): Secondary | ICD-10-CM | POA: Diagnosis not present

## 2021-03-01 DIAGNOSIS — R600 Localized edema: Secondary | ICD-10-CM | POA: Insufficient documentation

## 2021-03-01 DIAGNOSIS — Z79899 Other long term (current) drug therapy: Secondary | ICD-10-CM | POA: Insufficient documentation

## 2021-03-01 DIAGNOSIS — I480 Paroxysmal atrial fibrillation: Secondary | ICD-10-CM | POA: Insufficient documentation

## 2021-03-01 DIAGNOSIS — I10 Essential (primary) hypertension: Secondary | ICD-10-CM | POA: Diagnosis not present

## 2021-03-01 LAB — COMPREHENSIVE METABOLIC PANEL
ALT: 36 U/L (ref 0–44)
AST: 27 U/L (ref 15–41)
Albumin: 3.9 g/dL (ref 3.5–5.0)
Alkaline Phosphatase: 43 U/L (ref 38–126)
Anion gap: 10 (ref 5–15)
BUN: 14 mg/dL (ref 6–20)
CO2: 24 mmol/L (ref 22–32)
Calcium: 9.2 mg/dL (ref 8.9–10.3)
Chloride: 105 mmol/L (ref 98–111)
Creatinine, Ser: 0.76 mg/dL (ref 0.61–1.24)
GFR, Estimated: >60 mL/min (ref >60)
Glucose, Bld: 102 mg/dL — ABNORMAL HIGH (ref 70–99)
Potassium: 4.2 mmol/L (ref 3.5–5.1)
Sodium: 139 mmol/L (ref 135–145)
Total Bilirubin: 1.2 mg/dL (ref 0.3–1.2)
Total Protein: 6.6 g/dL (ref 6.5–8.1)

## 2021-03-01 LAB — MAGNESIUM: Magnesium: 2.2 mg/dL (ref 1.7–2.4)

## 2021-03-01 LAB — LIPID PANEL
Cholesterol: 136 mg/dL (ref 0–200)
HDL: 33 mg/dL — ABNORMAL LOW (ref >40)
LDL Cholesterol: 92 mg/dL (ref 0–99)
Total CHOL/HDL Ratio: 4.1 RATIO
Triglycerides: 55 mg/dL (ref <150)
VLDL: 11 mg/dL (ref 0–40)

## 2021-03-01 LAB — HEMOGLOBIN A1C
Hgb A1c MFr Bld: 4.9 % (ref 4.8–5.6)
Mean Plasma Glucose: 93.93 mg/dL

## 2021-03-01 LAB — T4, FREE: Free T4: 0.96 ng/dL (ref 0.61–1.12)

## 2021-03-01 LAB — CBC
HCT: 45.2 % (ref 39.0–52.0)
Hemoglobin: 15.5 g/dL (ref 13.0–17.0)
MCH: 32 pg (ref 26.0–34.0)
MCHC: 34.3 g/dL (ref 30.0–36.0)
MCV: 93.2 fL (ref 80.0–100.0)
Platelets: 136 10*3/uL — ABNORMAL LOW (ref 150–400)
RBC: 4.85 MIL/uL (ref 4.22–5.81)
RDW: 12.3 % (ref 11.5–15.5)
WBC: 3.4 10*3/uL — ABNORMAL LOW (ref 4.0–10.5)
nRBC: 0 % (ref 0.0–0.2)

## 2021-03-01 LAB — TSH: TSH: 2.034 u[IU]/mL (ref 0.350–4.500)

## 2021-03-01 NOTE — Addendum Note (Signed)
Encounter addended by: Chinita Pester, CMA on: 03/01/2021 12:32 PM  Actions taken: Order list changed, Diagnosis association updated, Charge Capture section accepted, Clinical Note Signed

## 2021-03-01 NOTE — Patient Instructions (Signed)
EKG done today.   Labs done today. We will contact you only if your labs are abnormal.  No medication changes were made. Please continue all current medications as prescribed.  Your physician recommends that you schedule a follow-up appointment in: 6 months. Please contact our office in January 2023 to schedule a February appointment.   If you have any questions or concerns before your next appointment please send [place] a message through [place] or call our office at [phone].    TO LEAVE A MESSAGE FOR THE NURSE SELECT OPTION 2, PLEASE LEAVE A MESSAGE INCLUDING: YOUR NAME DATE OF BIRTH CALL BACK NUMBER REASON FOR CALL**this is important as we prioritize the call backs  YOU WILL RECEIVE A CALL BACK THE SAME DAY AS LONG AS YOU CALL BEFORE 4:00 PM   Do the following things EVERYDAY: Weigh yourself in the morning before breakfast. Write it down and keep it in a log. Take your medicines as prescribed Eat low salt foods--Limit salt (sodium) to 2000 mg per day.  Stay as active as you can everyday Limit all fluids for the day to less than 2 liters   At the [facility], you and your health needs are our priority. As part of our continuing mission to provide you with exceptional heart care, we have created designated Provider Care Teams. These Care Teams include your primary Cardiologist (physician) and Advanced Practice Providers (APPs- Physician Assistants and Nurse Practitioners) who all work together to provide you with the care you need, when you need it.   You may see any of the following providers on your designated Care Team at your next follow up: Dr Arvilla Meres Dr Carron Curie, NP Robbie Lis, [place] Karle Plumber, PharmD   Please be sure to bring in all your medications bottles to every appointment.

## 2021-03-01 NOTE — Progress Notes (Signed)
Patient ID: Mark Alvine., male   DOB: March 29, 1961, 60 y.o.   MRN: [id number] . [facility] Note  PCP: Koirala, Dibas, MD  Subjective:    Mark Gonzalez is a very pleasant 60 y/o male with a history of psoriatic arthritis, obesity, hypertension, and atrial fibrillation.   Admitted several times in March 2015 with acute pericarditis. Treated with colchicine and steroids. Had recurrence and readmitted. Echo with moderate pericardial effusion. Started on prednisone 60 daily. Effusion resolved after 1 week. Remicade stopped as it was thought that it might be causative. Has not recurred.  Previously was working with [facility] and lost a lot of weight. On 09/24/19 had spinal surgery which went well except for LE edema. Now had developed peroneal neuropathy with left foot drop. Had peroneal nerve release in 5/21.    Failed flecainide and now maintained in NSR on Tikosyn with intermittent breakthrough.   Here for f/u. About a month ago had about of AF detected by his Apple Watch. Felt poorly. AF lasted between 24-48 hours and popped out. Now feels ok. No  HF symptoms. Also about 3 months ago had HRs in 40s. Tolerating Eliquis. No bleeding. Has lost about 45 pounds recently wit diet and exercise. Also avoiding ETOH (drinks Bourbon).    Current Outpatient Medications on File Prior to Encounter  Medication Sig Dispense Refill   b complex vitamins tablet Take 1 tablet by mouth daily.     Cholecalciferol (VITAMIN D-3) 125 MCG (5000 UT) TABS Take 5,000 Units by mouth daily.     diclofenac (VOLTAREN) 75 MG EC tablet Take 75 mg by mouth daily.   0   dofetilide (TIKOSYN) 500 MCG capsule TAKE 1 CAPSULE(500 MCG) BY MOUTH TWICE DAILY 60 capsule 11   ELIQUIS 5 MG TABS tablet TAKE 1 TABLET(5 MG) BY MOUTH TWICE DAILY 60 tablet 5   furosemide (LASIX) 40 MG tablet Take 40 mg by mouth 2 (two) times a week.     golimumab (SIMPONI ARIA) 50 MG/4ML SOLN injection Inject into the vein every 8 (eight) weeks.       irbesartan (AVAPRO) 150 MG tablet TAKE 1 TABLET(150 MG) BY MOUTH DAILY 30 tablet 11   loratadine (CLARITIN) 10 MG tablet Take 10 mg by mouth daily.     lovastatin (MEVACOR) 20 MG tablet TAKE 1 TABLET(20 MG) BY MOUTH AT BEDTIME 30 tablet 5   Magnesium 400 MG CAPS Take 400 mg by mouth every evening.      Milk Thistle 250 MG CAPS Take 250 mg by mouth every evening.     Multiple Vitamin (MULTIVITAMIN WITH MINERALS) TABS tablet Take 1 tablet by mouth daily.     Multiple Vitamins-Minerals (PRESERVISION AREDS 2 PO) Take 1 tablet by mouth in the morning and at bedtime.     Omega-3 Fatty Acids (FISH OIL ULTRA) 1400 MG CAPS Take 1,400 mg by mouth 2 (two) times daily.     potassium chloride SA (KLOR-CON) 20 MEQ tablet Take 40 mEq by mouth 2 (two) times a week.     PRESCRIPTION MEDICATION Take 9-18 mg by mouth as needed for erectile dysfunction. C-Tadalafil 9mg  Chew Tabs     Probiotic Product (PROBIOTIC PO) Take 1 capsule by mouth daily.      No current facility-administered medications on file prior to encounter.    Objective:       Weight change: Vitals:   03/01/21 1146  BP: 100/68  Pulse: (!) 57  SpO2: 96%  Weight: 05/01/21)  151.8 kg (334 lb 9.6 oz)   Filed Weights   03/01/21 1146  Weight: (!) 151.8 kg (334 lb 9.6 oz)   Wt Readings from Last 3 Encounters:  03/01/21 (!) 151.8 kg (334 lb 9.6 oz)  06/22/20 (!) 158.8 kg (350 lb)  05/01/20 (!) 153.1 kg (337 lb 9.6 oz)    Physical Exam: General:  Well appearing. No resp difficulty HEENT: normal Neck: supple. no JVD. Carotids 2+ bilat; no bruits. No lymphadenopathy or thryomegaly appreciated. Cor: PMI nondisplaced. Brady regular rate & rhythm. No rubs, gallops or murmurs. Lungs: clear Abdomen: obese soft, nontender, nondistended. No hepatosplenomegaly. No bruits or masses. Good bowel sounds. Extremities: no cyanosis, clubbing, rash, edema Neuro: alert & orientedx3, cranial nerves grossly intact. moves all 4 extremities w/o difficulty.  Affect pleasant   ECG: sinus brady 56 IVCD QTC 432 Personally reviewed   Assessment/Plan:    1. PAF in NSR on Tikosyn - stil with intermittent breakthroughs  -s/p previous AF ablation at [facility]   - AF well controlled on Tikosyn with intermittent breakthroughs. QTC ok. - check labs  - Continue Eliquis.   -Avoid ETOH 2. Morbid obesity  - now working on weight loss again 3. Psoriatic Arthritis  - Follows with Dr. Dierdre Forth 4. HTN  - Blood pressure well controlled. Continue current regimen. 5. H/o pericarditis felt to be related to Remicade 2015 6. Hyperlipidemia - Followed by PCP but hasn't seen in over 1 year. Will get labs  7. LE edema - resolved   Arvilla Meres, MD  12:11 PM

## 2021-03-02 LAB — T3, FREE: T3, Free: 2.8 pg/mL (ref 2.0–4.4)

## 2021-03-10 DIAGNOSIS — L4050 Arthropathic psoriasis, unspecified: Secondary | ICD-10-CM | POA: Diagnosis not present

## 2021-05-05 DIAGNOSIS — Z79899 Other long term (current) drug therapy: Secondary | ICD-10-CM | POA: Diagnosis not present

## 2021-05-05 DIAGNOSIS — L4050 Arthropathic psoriasis, unspecified: Secondary | ICD-10-CM | POA: Diagnosis not present

## 2021-05-25 ENCOUNTER — Other Ambulatory Visit (HOSPITAL_COMMUNITY): Payer: Self-pay | Admitting: Internal Medicine

## 2021-06-02 DIAGNOSIS — L401 Generalized pustular psoriasis: Secondary | ICD-10-CM | POA: Diagnosis not present

## 2021-06-02 DIAGNOSIS — L4050 Arthropathic psoriasis, unspecified: Secondary | ICD-10-CM | POA: Diagnosis not present

## 2021-06-02 DIAGNOSIS — L03115 Cellulitis of right lower limb: Secondary | ICD-10-CM | POA: Diagnosis not present

## 2021-06-02 DIAGNOSIS — I32 Pericarditis in diseases classified elsewhere: Secondary | ICD-10-CM | POA: Diagnosis not present

## 2021-06-15 DIAGNOSIS — F4321 Adjustment disorder with depressed mood: Secondary | ICD-10-CM | POA: Diagnosis not present

## 2021-06-23 ENCOUNTER — Other Ambulatory Visit (HOSPITAL_COMMUNITY): Payer: Self-pay | Admitting: Internal Medicine

## 2021-06-29 DIAGNOSIS — F4321 Adjustment disorder with depressed mood: Secondary | ICD-10-CM | POA: Diagnosis not present

## 2021-07-01 DIAGNOSIS — L4050 Arthropathic psoriasis, unspecified: Secondary | ICD-10-CM | POA: Diagnosis not present

## 2021-07-13 DIAGNOSIS — F4321 Adjustment disorder with depressed mood: Secondary | ICD-10-CM | POA: Diagnosis not present

## 2021-07-21 ENCOUNTER — Other Ambulatory Visit (HOSPITAL_COMMUNITY): Payer: Self-pay

## 2021-07-21 MED ORDER — IRBESARTAN 150 MG PO TABS
ORAL_TABLET | ORAL | 11 refills | Status: DC
Start: 2021-07-21 — End: 2021-08-30

## 2021-08-02 DIAGNOSIS — F4321 Adjustment disorder with depressed mood: Secondary | ICD-10-CM | POA: Diagnosis not present

## 2021-08-17 DIAGNOSIS — F4321 Adjustment disorder with depressed mood: Secondary | ICD-10-CM | POA: Diagnosis not present

## 2021-08-20 ENCOUNTER — Other Ambulatory Visit: Payer: Self-pay

## 2021-08-20 ENCOUNTER — Other Ambulatory Visit (HOSPITAL_COMMUNITY): Payer: Self-pay | Admitting: Internal Medicine

## 2021-08-20 MED ORDER — APIXABAN 5 MG PO TABS: ORAL_TABLET | ORAL | 5 refills | Status: DC

## 2021-08-20 NOTE — Telephone Encounter (Signed)
This is a CHF pt 

## 2021-08-26 ENCOUNTER — Other Ambulatory Visit (HOSPITAL_COMMUNITY): Payer: Self-pay | Admitting: Internal Medicine

## 2021-08-30 ENCOUNTER — Other Ambulatory Visit (HOSPITAL_COMMUNITY): Payer: Self-pay | Admitting: *Deleted

## 2021-08-30 MED ORDER — APIXABAN 5 MG PO TABS: ORAL_TABLET | ORAL | 1 refills | Status: DC

## 2021-08-30 MED ORDER — FUROSEMIDE 40 MG PO TABS: ORAL_TABLET | ORAL | 1 refills | Status: DC

## 2021-08-30 MED ORDER — IRBESARTAN 150 MG PO TABS: ORAL_TABLET | ORAL | 1 refills | Status: DC

## 2021-08-30 MED ORDER — LOVASTATIN 20 MG PO TABS: 20.0000 mg | ORAL_TABLET | Freq: Every day | ORAL | 1 refills | Status: DC

## 2021-08-30 MED ORDER — DOFETILIDE 500 MCG PO CAPS: ORAL_CAPSULE | ORAL | 1 refills | Status: DC

## 2021-08-30 MED ORDER — POTASSIUM CHLORIDE CRYS ER 20 MEQ PO TBCR: EXTENDED_RELEASE_TABLET | ORAL | 1 refills | Status: DC

## 2021-08-31 DIAGNOSIS — L4050 Arthropathic psoriasis, unspecified: Secondary | ICD-10-CM | POA: Diagnosis not present

## 2021-08-31 DIAGNOSIS — F4321 Adjustment disorder with depressed mood: Secondary | ICD-10-CM | POA: Diagnosis not present

## 2021-09-14 DIAGNOSIS — F4321 Adjustment disorder with depressed mood: Secondary | ICD-10-CM | POA: Diagnosis not present

## 2021-09-28 DIAGNOSIS — F4321 Adjustment disorder with depressed mood: Secondary | ICD-10-CM | POA: Diagnosis not present

## 2021-10-06 DIAGNOSIS — F4321 Adjustment disorder with depressed mood: Secondary | ICD-10-CM | POA: Diagnosis not present

## 2021-10-17 ENCOUNTER — Other Ambulatory Visit (HOSPITAL_COMMUNITY): Payer: Self-pay | Admitting: Internal Medicine

## 2021-10-22 DIAGNOSIS — F419 Anxiety disorder, unspecified: Secondary | ICD-10-CM | POA: Diagnosis not present

## 2021-10-27 DIAGNOSIS — N35011 Post-traumatic bulbous urethral stricture: Secondary | ICD-10-CM | POA: Diagnosis not present

## 2021-10-27 DIAGNOSIS — N4341 Spermatocele of epididymis, single: Secondary | ICD-10-CM | POA: Diagnosis not present

## 2021-10-27 DIAGNOSIS — L4050 Arthropathic psoriasis, unspecified: Secondary | ICD-10-CM | POA: Diagnosis not present

## 2021-10-28 DIAGNOSIS — F4321 Adjustment disorder with depressed mood: Secondary | ICD-10-CM | POA: Diagnosis not present

## 2021-11-08 ENCOUNTER — Ambulatory Visit (INDEPENDENT_AMBULATORY_CARE_PROVIDER_SITE_OTHER): Payer: BC Managed Care – PPO

## 2021-11-08 ENCOUNTER — Ambulatory Visit: Payer: BC Managed Care – PPO | Admitting: Podiatry

## 2021-11-08 DIAGNOSIS — M19071 Primary osteoarthritis, right ankle and foot: Secondary | ICD-10-CM | POA: Diagnosis not present

## 2021-11-08 DIAGNOSIS — M79674 Pain in right toe(s): Secondary | ICD-10-CM | POA: Diagnosis not present

## 2021-11-08 DIAGNOSIS — M79671 Pain in right foot: Secondary | ICD-10-CM

## 2021-11-08 DIAGNOSIS — M79675 Pain in left toe(s): Secondary | ICD-10-CM | POA: Diagnosis not present

## 2021-11-08 DIAGNOSIS — B351 Tinea unguium: Secondary | ICD-10-CM | POA: Diagnosis not present

## 2021-11-08 DIAGNOSIS — M19079 Primary osteoarthritis, unspecified ankle and foot: Secondary | ICD-10-CM

## 2021-11-08 DIAGNOSIS — M21372 Foot drop, left foot: Secondary | ICD-10-CM | POA: Diagnosis not present

## 2021-11-08 DIAGNOSIS — M2042 Other hammer toe(s) (acquired), left foot: Secondary | ICD-10-CM

## 2021-11-08 DIAGNOSIS — M19072 Primary osteoarthritis, left ankle and foot: Secondary | ICD-10-CM

## 2021-11-08 NOTE — Progress Notes (Signed)
Subjective:  ? ?Patient ID: Mark Born., male   DOB: 61 y.o.   MRN: [id number]  ? ?HPI ?61 year old male presents the office today for concerns of thick, elongated toenails that are discolored as well.  He also states he has dropfoot which started in 2021.  He does like to walk about 4 miles a day but recently did not stop walking because of the discomfort overall to his foot.  Pain is mostly to the toes.  He does state he pronates and the insert is starting to wear down on the left.  Denies any recent injuries. ? ? ?Review of Systems  ?All other systems reviewed and are negative. ? ?Past Medical History:  ?Diagnosis Date  ? Atrial flutter ([place])   ? a. s/p DCCV 08/16/2012.  ? Cellulitis   ? Cellulitis of leg, left 09/24/2015  ? DVT (deep venous thrombosis) ([place]) 09/2014  ? "may have just been cellulitis"   ? Dysrhythmia   ? afib  ? GERD (gastroesophageal reflux disease)   ? Hypercholesterolemia   ? Hypertension   ? Morbid obesity ([place])   ? NICM (nonischemic cardiomyopathy) ([place])   ? a. EF 45-50% in setting of AF in past; b. Neg MV;  c. 04/2010 Echo: EF 55-60%;  d. 11/2011 Cath: nl cors.  ? Paroxysmal atrial fibrillation ([place])   ? a. s/p DCCV 09/2007, 11/2007, 04/2013 --> flecainide and chronic coumadin.  ? Pneumonia ~ 1995  ? Psoriasis   ? "leasions multiple places on my body; under control w/RX now" (09/25/2015)  ? Psoriatic arthritis ([place])   ? "primarily in my hands" (09/25/2015)  ? ? ?Past Surgical History:  ?Procedure Laterality Date  ? ANTERIOR LAT LUMBAR FUSION Right 09/24/2019  ? Procedure: Right Lumbar Three-Four Lumbar Four-Five Anterolateral decompression/fusion/percutaneous pedicle screw fixation;  Surgeon: Kristeen Miss, MD;  Location: [place];  Service: Neurosurgery;  Laterality: Right;  anterolateral  ? APPENDECTOMY    ? APPLICATION OF ROBOTIC ASSISTANCE FOR SPINAL PROCEDURE N/A 09/24/2019  ? Procedure: APPLICATION OF ROBOTIC ASSISTANCE FOR SPINAL PROCEDURE;  Surgeon: Kristeen Miss, MD;  Location: [place];   Service: Neurosurgery;  Laterality: N/A;  ? ATRIAL FIBRILLATION ABLATION  07/2011  ? BACK SURGERY    ? CARDIAC CATHETERIZATION   11/2011  ? CARDIOVERSION    ? "I've probably had 10" (09/25/2015)  ? CARDIOVERSION  08/16/2012  ? Procedure: CARDIOVERSION;  Surgeon: Jolaine Artist, MD;  Location: [facility] ENDOSCOPY;  Service: Cardiovascular;  Laterality: N/A;  ? CARDIOVERSION N/A 06/05/2013  ? Procedure: CARDIOVERSION;  Surgeon: Jolaine Artist, MD;  Location: [place];  Service: Cardiovascular;  Laterality: N/A;  ? CARDIOVERSION N/A 07/11/2014  ? Procedure: CARDIOVERSION;  Surgeon: Jolaine Artist, MD;  Location: [facility] ENDOSCOPY;  Service: Cardiovascular;  Laterality: N/A;  ? COLONOSCOPY    ? FRACTURE SURGERY    ? LEFT HEART CATHETERIZATION WITH CORONARY ANGIOGRAM N/A 12/22/2011  ? Procedure: LEFT HEART CATHETERIZATION WITH CORONARY ANGIOGRAM;  Surgeon: Jolaine Artist, MD;  Location: [facility] CATH LAB;  Service: Cardiovascular;  Laterality: N/A;  ? LUMBAR PERCUTANEOUS PEDICLE SCREW 2 LEVEL N/A 09/24/2019  ? Procedure: Introlateral Decompression Lumbar Three-Four with XLIFT Spacer, Arthrodesis with Allograft, Posterior Fixation Lumbar Three-Five with Percutaneous screws with Robotic Assistance. Laminectomy Lumbar Four-Five Posterior Lumbar Interbody Arthrodesis Unilateral Lumbar four-Five On Left, Posterior Lateral Arthrodesis Lumbar Four-Five Bilaterally.;  Surgeon: Kristeen Miss, MD;  Location: [place]  ? ORIF METATARSAL FRACTURE  ~ 2001  ? "5th"  ? PERONEAL NERVE DECOMPRESSION Left  12/05/2019  ? Procedure: Left PERONEAL NERVE DECOMPRESSION;  Surgeon: Kristeen Miss, MD;  Location: [place];  Service: Neurosurgery;  Laterality: Left;  3C  ? TONSILLECTOMY    ? ? ? ?Current Outpatient Medications:  ?  apixaban (ELIQUIS) 5 MG TABS tablet, TAKE 1 TABLET(5 MG) BY MOUTH TWICE DAILY, Disp: 180 tablet, Rfl: 1 ?  b complex vitamins tablet, Take 1 tablet by mouth daily., Disp: , Rfl:  ?  Cholecalciferol (VITAMIN D-3) 125 MCG (5000 UT) TABS, Take  5,000 Units by mouth daily., Disp: , Rfl:  ?  Cholecalciferol 25 MCG (1000 UT) tablet, , Disp: , Rfl:  ?  clobetasol cream (TEMOVATE) [id number] %, 1 application to affected area, Disp: , Rfl:  ?  colchicine 0.6 MG tablet, 1 tablet, Disp: , Rfl:  ?  diclofenac (VOLTAREN) 50 MG EC tablet, , Disp: , Rfl:  ?  diclofenac (VOLTAREN) 75 MG EC tablet, Take 75 mg by mouth daily. , Disp: , Rfl: 0 ?  dofetilide (TIKOSYN) 500 MCG capsule, TAKE 1 CAPSULE(500 MCG) BY MOUTH TWICE DAILY NEEDS FOLLOW UP APPOINTMENT FOR ANYMORE REFILLS, Disp: 60 capsule, Rfl: 0 ?  furosemide (LASIX) 40 MG tablet, TAKE 1 TABLET(40 MG) BY MOUTH DAILY, Disp: 90 tablet, Rfl: 1 ?  golimumab (SIMPONI ARIA) 50 MG/4ML SOLN injection, Inject into the vein every 8 (eight) weeks. , Disp: , Rfl:  ?  irbesartan (AVAPRO) 150 MG tablet, TAKE 1 TABLET(150 MG) BY MOUTH DAILY, Disp: 90 tablet, Rfl: 1 ?  loratadine (CLARITIN) 10 MG tablet, Take 10 mg by mouth daily., Disp: , Rfl:  ?  lovastatin (MEVACOR) 20 MG tablet, Take 1 tablet (20 mg total) by mouth daily at 6 PM., Disp: 90 tablet, Rfl: 1 ?  lovastatin (MEVACOR) 20 MG tablet, 1 tablet with a meal, Disp: , Rfl:  ?  Magnesium 400 MG CAPS, Take 400 mg by mouth every evening. , Disp: , Rfl:  ?  Magnesium Gluconate 550 MG TABS, , Disp: , Rfl:  ?  Milk Thistle 250 MG CAPS, Take 250 mg by mouth every evening., Disp: , Rfl:  ?  Multiple Vitamin (MULTIVITAMIN WITH MINERALS) TABS tablet, Take 1 tablet by mouth daily., Disp: , Rfl:  ?  Multiple Vitamins-Minerals (PRESERVISION AREDS 2 PO), Take 1 tablet by mouth in the morning and at bedtime., Disp: , Rfl:  ?  niacin (NIASPAN) 500 MG CR tablet, 1 tablet at bedtime., Disp: , Rfl:  ?  Omega-3 Fatty Acids (FISH OIL ULTRA) 1400 MG CAPS, Take 1,400 mg by mouth 2 (two) times daily., Disp: , Rfl:  ?  potassium chloride SA (KLOR-CON M) 20 MEQ tablet, TAKE 2 TABLETS(40 MEQ) BY MOUTH DAILY, Disp: 180 tablet, Rfl: 1 ?  PRESCRIPTION MEDICATION, Take 9-18 mg by mouth as needed for erectile  dysfunction. C-Tadalafil 9mg  Chew Tabs, Disp: , Rfl:  ?  Probiotic Product (PROBIOTIC PO), Take 1 capsule by mouth daily. , Disp: , Rfl:  ? ?Allergies  ?Allergen Reactions  ? Lisinopril Cough  ?  Other reaction(s): Unknown  ? ? ? ? ? ?   ?Objective:  ?Physical Exam  ?General: AAO x3, NAD ? ?Dermatological: Nails particularly proximal nails are hypertrophic, dystrophic with yellow discoloration.  There is no edema, erythema or signs of infection of the toenails ? ?No open lesions. ? ?Vascular: Dorsalis Pedis artery and Posterior Tibial artery pedal pulses are 2/4 bilateral with immedate capillary fill time. There is no pain with calf compression, swelling, warmth, erythema.  ? ?Neruologic: Grossly  intact via light touch bilateral.  ? ?Musculoskeletal: Dropfoot present in the left foot.  Hammertoe contractures which are rigid most notably at the second, third, fourth digit.  Transverse plane deformity of the third digit on the left foot as well the third and fourth toes are rubbing.  Muscular strength 5/5 in all groups tested bilateral. ? ?Gait: Unassisted, Nonantalgic.  ? ? ?   ?Assessment:  ? ?Likely onychomycosis; dropfoot, hammertoe deformity ? ?   ?Plan:  ?-Treatment options discussed including all alternatives, risks, and complications ?-Etiology of symptoms were discussed ?-X-rays were obtained and reviewed with the patient.  3 views of bilateral feet were obtained.  The right foot there are significant arthritic changes present Lisfranc joint.  Previous fractures of the third, fourth, fifth metatarsals which are old.  Screw intact in the fifth metatarsal.  Decreased calcaneal inclination angle bilaterally.  No subacute fracture bilaterally. ?-Sharply debrided the nails I sent this for culture, pathology to [facility] labs.  I do think this is more of onychomycosis.  Also has psoriatic arthritis which could be contributing to this as well. ?-He did not have his inserts today, make an appointment to come in to see  Aaron Edelman our orthotist for evaluation of inserts to see if they can modify this versus may continue inserts for him. ?-Dispensed toe separators as well as toe crest for the hammertoes ? ?Trula Slade DPM ? ?

## 2021-11-17 DIAGNOSIS — F4321 Adjustment disorder with depressed mood: Secondary | ICD-10-CM | POA: Diagnosis not present

## 2021-11-23 ENCOUNTER — Encounter: Payer: Self-pay | Admitting: Podiatry

## 2021-12-01 DIAGNOSIS — L03115 Cellulitis of right lower limb: Secondary | ICD-10-CM | POA: Diagnosis not present

## 2021-12-01 DIAGNOSIS — I32 Pericarditis in diseases classified elsewhere: Secondary | ICD-10-CM | POA: Diagnosis not present

## 2021-12-01 DIAGNOSIS — L4050 Arthropathic psoriasis, unspecified: Secondary | ICD-10-CM | POA: Diagnosis not present

## 2021-12-01 DIAGNOSIS — L401 Generalized pustular psoriasis: Secondary | ICD-10-CM | POA: Diagnosis not present

## 2021-12-02 ENCOUNTER — Encounter: Payer: Self-pay | Admitting: Podiatry

## 2021-12-02 ENCOUNTER — Other Ambulatory Visit: Payer: Self-pay | Admitting: Podiatry

## 2021-12-02 ENCOUNTER — Telehealth: Payer: Self-pay

## 2021-12-02 DIAGNOSIS — Z79899 Other long term (current) drug therapy: Secondary | ICD-10-CM

## 2021-12-02 NOTE — Telephone Encounter (Signed)
Called patient at Dr. Gabriel Rung request and determined that the foot orthotics and brace he received from [facility] are under [employer]'s warranty and if he needs my services I am available, but [employer] should be able to make the repairs themselves free of charge. ?

## 2021-12-07 DIAGNOSIS — Z79899 Other long term (current) drug therapy: Secondary | ICD-10-CM | POA: Diagnosis not present

## 2021-12-07 DIAGNOSIS — F4321 Adjustment disorder with depressed mood: Secondary | ICD-10-CM | POA: Diagnosis not present

## 2021-12-08 DIAGNOSIS — N4341 Spermatocele of epididymis, single: Secondary | ICD-10-CM | POA: Diagnosis not present

## 2021-12-08 DIAGNOSIS — N35011 Post-traumatic bulbous urethral stricture: Secondary | ICD-10-CM | POA: Diagnosis not present

## 2021-12-08 LAB — HEPATIC FUNCTION PANEL
ALT: 28 IU/L (ref 0–44)
AST: 26 IU/L (ref 0–40)
Albumin: 4.2 g/dL (ref 3.8–4.8)
Alkaline Phosphatase: 63 IU/L (ref 44–121)
Bilirubin Total: 0.4 mg/dL (ref 0.0–1.2)
Bilirubin, Direct: 0.13 mg/dL (ref 0.00–0.40)
Total Protein: 6.2 g/dL (ref 6.0–8.5)

## 2021-12-08 LAB — CBC WITH DIFFERENTIAL/PLATELET
Basophils Absolute: 0 x10E3/uL (ref 0.0–0.2)
Basos: 0 %
EOS (ABSOLUTE): 0 x10E3/uL (ref 0.0–0.4)
Eos: 1 %
Hematocrit: 41.1 % (ref 37.5–51.0)
Hemoglobin: 14.1 g/dL (ref 13.0–17.7)
Immature Grans (Abs): 0 x10E3/uL (ref 0.0–0.1)
Immature Granulocytes: 0 %
Lymphocytes Absolute: 1.4 x10E3/uL (ref 0.7–3.1)
Lymphs: 45 %
MCH: 32.6 pg (ref 26.6–33.0)
MCHC: 34.3 g/dL (ref 31.5–35.7)
MCV: 95 fL (ref 79–97)
Monocytes Absolute: 0.3 x10E3/uL (ref 0.1–0.9)
Monocytes: 11 %
Neutrophils Absolute: 1.3 x10E3/uL — ABNORMAL LOW (ref 1.4–7.0)
Neutrophils: 43 %
Platelets: 122 x10E3/uL — ABNORMAL LOW (ref 150–450)
RBC: 4.33 x10E6/uL (ref 4.14–5.80)
RDW: 12.9 % (ref 11.6–15.4)
WBC: 3.1 x10E3/uL — ABNORMAL LOW (ref 3.4–10.8)

## 2021-12-09 ENCOUNTER — Other Ambulatory Visit: Payer: Self-pay | Admitting: Podiatry

## 2021-12-09 MED ORDER — EFINACONAZOLE 10 % EX SOLN: 1.0000 [drp] | Freq: Every day | CUTANEOUS | 11 refills | Status: DC

## 2021-12-13 ENCOUNTER — Telehealth (HOSPITAL_COMMUNITY): Payer: Self-pay | Admitting: *Deleted

## 2021-12-13 NOTE — Telephone Encounter (Signed)
Received fax from [facility], pt needs clearance for cysto balloon dilation of urethral stricture, hydrocelectomy, and to hold Eliquis 48 hrs prior ? ?Per Dr Haroldine Laws: Ok to proceed from cardiac perspective, ok to hold eliquis ? ?Form faxed back  to them at [phone] ?

## 2021-12-14 ENCOUNTER — Other Ambulatory Visit: Payer: Self-pay | Admitting: Urology

## 2021-12-21 ENCOUNTER — Ambulatory Visit: Payer: BC Managed Care – PPO

## 2021-12-21 ENCOUNTER — Ambulatory Visit: Payer: BC Managed Care – PPO | Admitting: Podiatry

## 2021-12-21 DIAGNOSIS — B351 Tinea unguium: Secondary | ICD-10-CM

## 2021-12-21 DIAGNOSIS — M21372 Foot drop, left foot: Secondary | ICD-10-CM

## 2021-12-21 NOTE — Progress Notes (Signed)
SITUATION Reason for Consult: Follow-up with [employer]-provided equipment Patient / Caregiver Report: Patient wanted a second opinion  OBJECTIVE DATA History / Diagnosis:    ICD-10-CM   1. Left foot drop  M21.372       Change in Pathology: None  ACTIONS PERFORMED Patient's equipment was checked for structural stability and fit. [employer] provided [employer] and custom foot orthotics were in working order and required no major adjustments. Offered advice on making foot orthotics thinner at the toes. Device(s) intact and fit is excellent. All questions answered and concerns addressed.  PLAN Follow-up as needed (PRN). Plan of care discussed with and agreed upon by patient / caregiver.

## 2021-12-22 DIAGNOSIS — R5383 Other fatigue: Secondary | ICD-10-CM | POA: Diagnosis not present

## 2021-12-22 DIAGNOSIS — Z111 Encounter for screening for respiratory tuberculosis: Secondary | ICD-10-CM | POA: Diagnosis not present

## 2021-12-22 DIAGNOSIS — L4050 Arthropathic psoriasis, unspecified: Secondary | ICD-10-CM | POA: Diagnosis not present

## 2021-12-22 DIAGNOSIS — Z79899 Other long term (current) drug therapy: Secondary | ICD-10-CM | POA: Diagnosis not present

## 2021-12-24 ENCOUNTER — Encounter (HOSPITAL_BASED_OUTPATIENT_CLINIC_OR_DEPARTMENT_OTHER): Payer: Self-pay | Admitting: Urology

## 2021-12-24 ENCOUNTER — Other Ambulatory Visit: Payer: Self-pay

## 2021-12-24 DIAGNOSIS — Z0000 Encounter for general adult medical examination without abnormal findings: Secondary | ICD-10-CM | POA: Diagnosis not present

## 2021-12-24 DIAGNOSIS — I10 Essential (primary) hypertension: Secondary | ICD-10-CM | POA: Diagnosis not present

## 2021-12-24 DIAGNOSIS — Z125 Encounter for screening for malignant neoplasm of prostate: Secondary | ICD-10-CM | POA: Diagnosis not present

## 2021-12-24 DIAGNOSIS — E7800 Pure hypercholesterolemia, unspecified: Secondary | ICD-10-CM | POA: Diagnosis not present

## 2021-12-24 NOTE — Progress Notes (Signed)
Spoke w/ via phone for pre-op interview--- pt Lab needs dos----   [facility] Rx results------ current ekg in epic/ chart COVID test -----patient states asymptomatic no test needed Arrive at ------- 0700 on 12-30-2021 NPO after MN NO Solid Food.  Clear liquids from MN until--- 0600 Med rec completed Medications to take morning of surgery ----- tikosyn Diabetic medication ----- n/a Patient instructed no nail polish to be worn day of surgery Patient instructed to bring photo id and insurance card day of surgery Patient aware to have Driver (ride ) / caregiver  for 24 hours after surgery -- wife, cathy Patient Special Instructions ----- n/a Pre-Op special Istructions ----- pt has cardiac clearance note by Dr Gala Romney Meredith Staggers RN) on 12-13-2021 in epic/ chart Patient verbalized understanding of instructions that were given at this phone interview. Patient denies shortness of breath, chest pain, fever, cough at this phone interview.    Anesthesia Review: HTN;  PAF;  chronic diastolic CHF;  NICM last ef 55-60%;  psoriatic arthritis  PCP: Dr Docia Chuck Cardiologist : Dr Gala Romney Theron Arista 03-01-2021) EKG : 03-01-2021 Echo : 10-15-2013 Stress test: no Cardiac Cath : 12-27-2011 epic Activity level: denies sob w/ any activity  Blood Thinner/ Instructions Maurice Small Dose: Eliquis ASA / Instructions/ Last Dose :  no Per pt was given instructions for cardiologist to stop 48 hours prior to surgery, pt stated last dose will be 12-27-2021

## 2021-12-28 IMAGING — DX DG LUMBAR SPINE COMPLETE 4+V
5 series · 5 of 5 positions shown · non-contrast
Comparison: 10/14/2019

CLINICAL DATA: Fell today with low back pain. Lumbar surgery 5
weeks ago.

EXAM:
LUMBAR SPINE - COMPLETE 4+ VIEW

[l-spine ap]
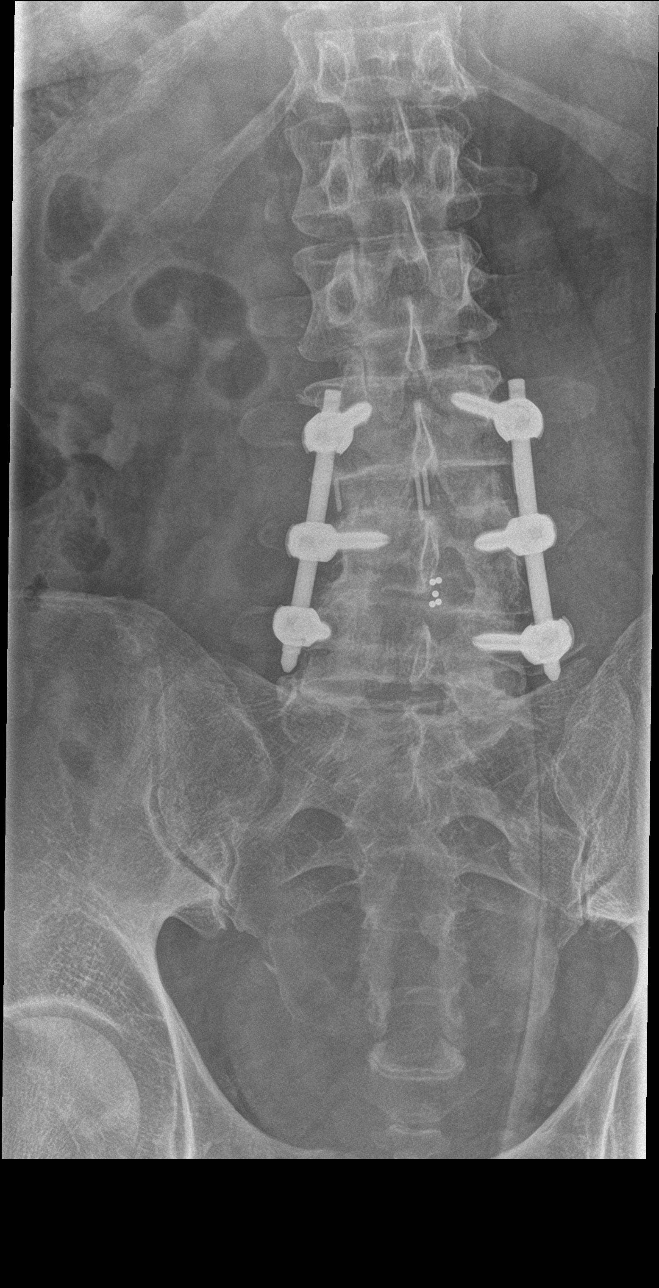

[l-spine obl (1 of 2)]
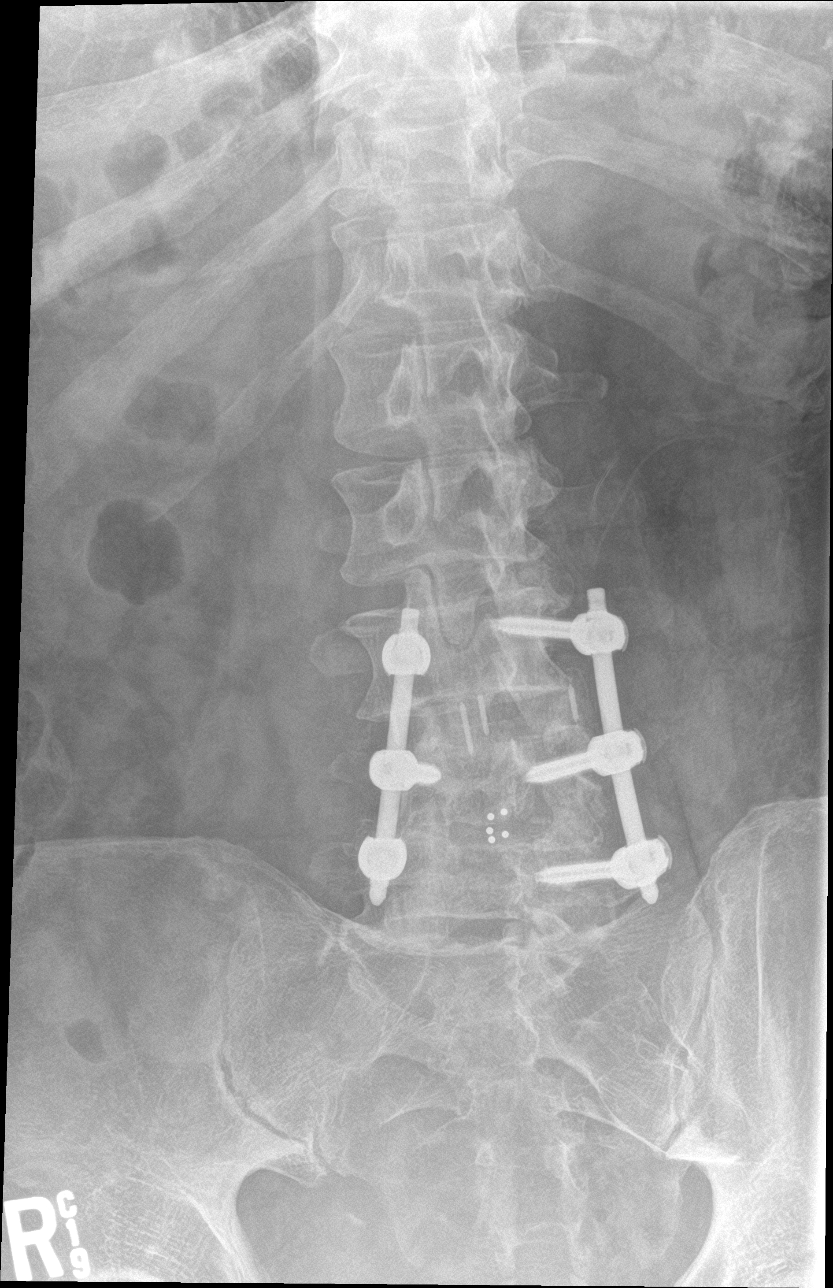

[l-spine obl (2 of 2)]
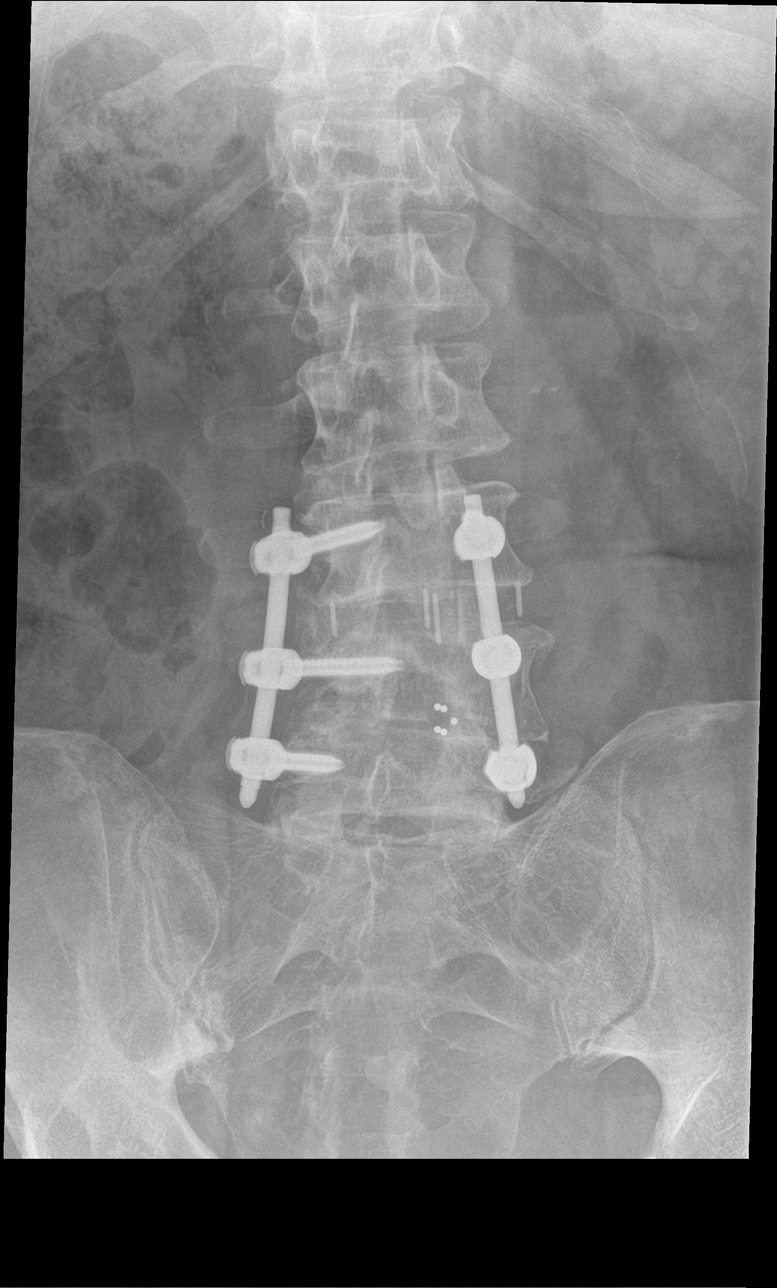

[l-spine lat]
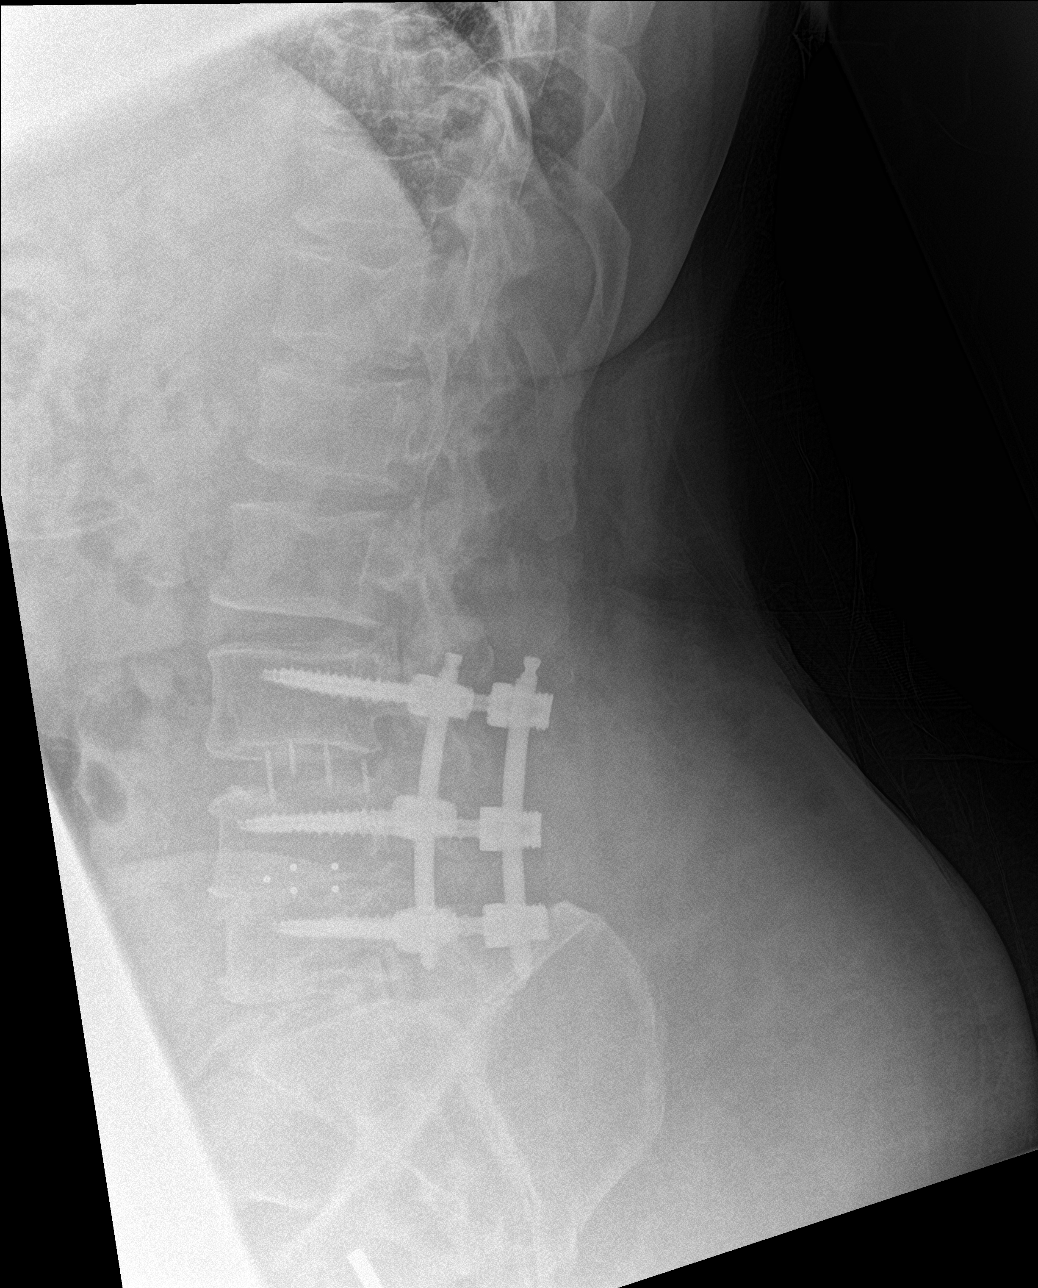

[l-spine spot]
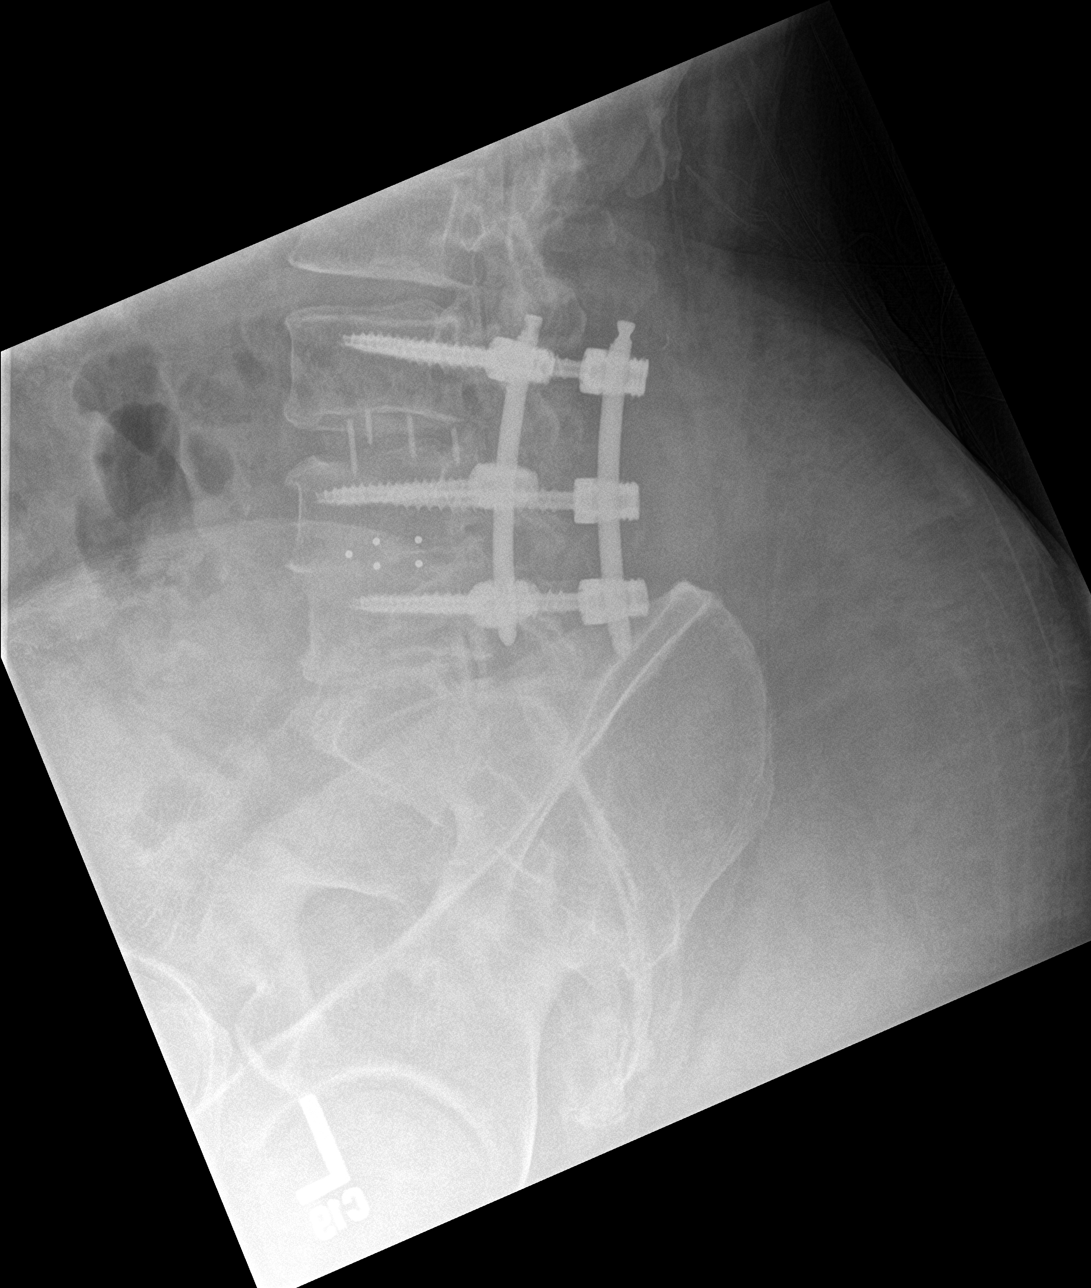

[5 of 5 positions shown; findings below may reference images not displayed]

FINDINGS: Previous discectomy and fusion procedures from L3 through L5. No
evidence of hardware failure or other postsurgical complication.
IMPRESSION: No discernible change. Previous discectomy and fusion from L3
through L5. No traumatic finding.

## 2021-12-29 ENCOUNTER — Encounter: Payer: Self-pay | Admitting: Podiatry

## 2021-12-29 NOTE — Progress Notes (Signed)
Subjective: 61 year old male presents the office today for evaluation and follow-up of nail fungus.  Given his blood work we had an oral Lamisil but he just recently picked up the [place] which she started using.  The nails are thick and he has difficulty trimming them he is asking for the trimmed today.  No edema, erythema or signs of infection.  He has left foot drop and he brought in the brace today for second opinion to make sure it is fitting appropriately.  He did not see Arlys John today, orthotist.  Objective: AAO x3, NAD DP/PT pulses palpable bilaterally, CRT less than 3 seconds Overall exam appears unchanged.  The nails continue be hypertrophic, dystrophic with yellow, brown discoloration.  No edema, erythema.  No signs of infection.  No open lesions. No pain with calf compression, swelling, warmth, erythema  Assessment: Symptomatic onychomycosis  Plan: -All treatment options discussed with the patient including all alternatives, risks, complications.  -For the nails x10 without any complications or bleeding as a courtesy.  Continue with the topical medication. -Patient encouraged to call the office with any questions, concerns, change in symptoms.   Mark Gonzalez DPM

## 2021-12-30 ENCOUNTER — Ambulatory Visit (HOSPITAL_BASED_OUTPATIENT_CLINIC_OR_DEPARTMENT_OTHER): Payer: BC Managed Care – PPO | Admitting: Anesthesiology

## 2021-12-30 ENCOUNTER — Other Ambulatory Visit: Payer: Self-pay

## 2021-12-30 ENCOUNTER — Encounter (HOSPITAL_BASED_OUTPATIENT_CLINIC_OR_DEPARTMENT_OTHER)
Admission: RE | Disposition: A | Discharge status: Home / Self Care | Payer: Self-pay | Source: Home / Self Care | Attending: Urology

## 2021-12-30 ENCOUNTER — Encounter (HOSPITAL_BASED_OUTPATIENT_CLINIC_OR_DEPARTMENT_OTHER): Payer: Self-pay | Admitting: Urology

## 2021-12-30 ENCOUNTER — Ambulatory Visit (HOSPITAL_BASED_OUTPATIENT_CLINIC_OR_DEPARTMENT_OTHER)
Admission: RE | Admit: 2021-12-30 | Discharge: 2021-12-30 | Disposition: A | Discharge status: Home / Self Care | Payer: BC Managed Care – PPO | Attending: Urology | Admitting: Urology

## 2021-12-30 DIAGNOSIS — K219 Gastro-esophageal reflux disease without esophagitis: Secondary | ICD-10-CM | POA: Diagnosis not present

## 2021-12-30 DIAGNOSIS — I480 Paroxysmal atrial fibrillation: Secondary | ICD-10-CM | POA: Insufficient documentation

## 2021-12-30 DIAGNOSIS — I4891 Unspecified atrial fibrillation: Secondary | ICD-10-CM | POA: Diagnosis not present

## 2021-12-30 DIAGNOSIS — Z7901 Long term (current) use of anticoagulants: Secondary | ICD-10-CM | POA: Insufficient documentation

## 2021-12-30 DIAGNOSIS — I5032 Chronic diastolic (congestive) heart failure: Secondary | ICD-10-CM | POA: Insufficient documentation

## 2021-12-30 DIAGNOSIS — F1290 Cannabis use, unspecified, uncomplicated: Secondary | ICD-10-CM | POA: Diagnosis not present

## 2021-12-30 DIAGNOSIS — I10 Essential (primary) hypertension: Secondary | ICD-10-CM | POA: Diagnosis not present

## 2021-12-30 DIAGNOSIS — N35919 Unspecified urethral stricture, male, unspecified site: Secondary | ICD-10-CM | POA: Diagnosis not present

## 2021-12-30 DIAGNOSIS — M1990 Unspecified osteoarthritis, unspecified site: Secondary | ICD-10-CM | POA: Diagnosis not present

## 2021-12-30 DIAGNOSIS — Z87891 Personal history of nicotine dependence: Secondary | ICD-10-CM | POA: Insufficient documentation

## 2021-12-30 DIAGNOSIS — Z79899 Other long term (current) drug therapy: Secondary | ICD-10-CM | POA: Diagnosis not present

## 2021-12-30 DIAGNOSIS — Z01818 Encounter for other preprocedural examination: Secondary | ICD-10-CM

## 2021-12-30 DIAGNOSIS — I110 Hypertensive heart disease with heart failure: Secondary | ICD-10-CM | POA: Insufficient documentation

## 2021-12-30 DIAGNOSIS — N433 Hydrocele, unspecified: Secondary | ICD-10-CM | POA: Diagnosis not present

## 2021-12-30 DIAGNOSIS — N35816 Other urethral stricture, male, overlapping sites: Secondary | ICD-10-CM | POA: Diagnosis not present

## 2021-12-30 HISTORY — DX: Presence of spectacles and contact lenses: Z97.3

## 2021-12-30 HISTORY — DX: Hydrocele, unspecified: N43.3

## 2021-12-30 HISTORY — DX: Unspecified symptoms and signs involving the genitourinary system: R39.9

## 2021-12-30 HISTORY — DX: Personal history of other diseases of urinary system: Z87.448

## 2021-12-30 HISTORY — DX: Long term (current) use of anticoagulants: Z79.01

## 2021-12-30 HISTORY — DX: Hyperlipidemia, unspecified: E78.5

## 2021-12-30 HISTORY — DX: Personal history of other diseases of the digestive system: Z87.19

## 2021-12-30 HISTORY — DX: Localized edema: R60.0

## 2021-12-30 HISTORY — PX: HYDROCELE EXCISION: SHX482

## 2021-12-30 HISTORY — DX: Male erectile dysfunction, unspecified: N52.9

## 2021-12-30 HISTORY — DX: Foot drop, left foot: M21.372

## 2021-12-30 HISTORY — PX: CYSTOSCOPY WITH URETHRAL DILATATION: SHX5125

## 2021-12-30 HISTORY — DX: Chronic diastolic (congestive) heart failure: I50.32

## 2021-12-30 HISTORY — DX: Spermatocele of epididymis, unspecified: N43.40

## 2021-12-30 LAB — POCT I-STAT, CHEM 8
BUN: 22 mg/dL (ref 8–23)
Calcium, Ion: 1.23 mmol/L (ref 1.15–1.40)
Chloride: 103 mmol/L (ref 98–111)
Creatinine, Ser: 0.8 mg/dL (ref 0.61–1.24)
Glucose, Bld: 79 mg/dL (ref 70–99)
HCT: 42 % (ref 39.0–52.0)
Hemoglobin: 14.3 g/dL (ref 13.0–17.0)
Potassium: 4.1 mmol/L (ref 3.5–5.1)
Sodium: 141 mmol/L (ref 135–145)
TCO2: 26 mmol/L (ref 22–32)

## 2021-12-30 SURGERY — HYDROCELECTOMY
Anesthesia: General | Site: Urethra | Laterality: Right

## 2021-12-30 MED ORDER — PROMETHAZINE HCL 25 MG/ML IJ SOLN: 6.2500 mg | INTRAMUSCULAR | Status: DC | PRN

## 2021-12-30 MED ORDER — ONDANSETRON HCL 4 MG/2ML IJ SOLN
INTRAMUSCULAR | Status: AC
  Filled 2021-12-30: qty 4

## 2021-12-30 MED ORDER — LIDOCAINE HCL (PF) 2 % IJ SOLN
INTRAMUSCULAR | Status: AC
  Filled 2021-12-30: qty 5

## 2021-12-30 MED ORDER — GLYCOPYRROLATE PF 0.2 MG/ML IJ SOSY
PREFILLED_SYRINGE | INTRAMUSCULAR | Status: AC
  Filled 2021-12-30: qty 1

## 2021-12-30 MED ORDER — AMISULPRIDE (ANTIEMETIC) 5 MG/2ML IV SOLN: 10.0000 mg | Freq: Once | INTRAVENOUS | Status: DC | PRN

## 2021-12-30 MED ORDER — EPHEDRINE SULFATE (PRESSORS) 50 MG/ML IJ SOLN
INTRAMUSCULAR | Status: DC | PRN
  Administered 2021-12-30: 10 mg via INTRAVENOUS
  Administered 2021-12-30 (×2): 5 mg via INTRAVENOUS

## 2021-12-30 MED ORDER — PROPOFOL 10 MG/ML IV BOLUS
INTRAVENOUS | Status: AC
  Filled 2021-12-30: qty 20

## 2021-12-30 MED ORDER — FENTANYL CITRATE (PF) 100 MCG/2ML IJ SOLN
INTRAMUSCULAR | Status: DC | PRN
  Administered 2021-12-30: 25 ug via INTRAVENOUS
  Administered 2021-12-30: 50 ug via INTRAVENOUS
  Administered 2021-12-30: 25 ug via INTRAVENOUS

## 2021-12-30 MED ORDER — OXYCODONE HCL 5 MG/5ML PO SOLN: 5.0000 mg | Freq: Once | ORAL | Status: DC | PRN

## 2021-12-30 MED ORDER — CEFAZOLIN SODIUM-DEXTROSE 2-4 GM/100ML-% IV SOLN
INTRAVENOUS | Status: AC
  Filled 2021-12-30: qty 100

## 2021-12-30 MED ORDER — TRAMADOL HCL 50 MG PO TABS: 50.0000 mg | ORAL_TABLET | Freq: Four times a day (QID) | ORAL | 0 refills | Status: DC | PRN

## 2021-12-30 MED ORDER — DEXAMETHASONE SODIUM PHOSPHATE 10 MG/ML IJ SOLN
INTRAMUSCULAR | Status: AC
  Filled 2021-12-30: qty 1

## 2021-12-30 MED ORDER — OXYCODONE HCL 5 MG PO TABS: 5.0000 mg | ORAL_TABLET | Freq: Once | ORAL | Status: DC | PRN

## 2021-12-30 MED ORDER — KETOROLAC TROMETHAMINE 30 MG/ML IJ SOLN
INTRAMUSCULAR | Status: DC | PRN
  Administered 2021-12-30: 30 mg via INTRAVENOUS

## 2021-12-30 MED ORDER — MIDAZOLAM HCL 5 MG/5ML IJ SOLN
INTRAMUSCULAR | Status: DC | PRN
  Administered 2021-12-30: 2 mg via INTRAVENOUS

## 2021-12-30 MED ORDER — SODIUM CHLORIDE 0.9 % IR SOLN
Status: DC | PRN
  Administered 2021-12-30: 3000 mL

## 2021-12-30 MED ORDER — LACTATED RINGERS IV SOLN: INTRAVENOUS | Status: DC

## 2021-12-30 MED ORDER — ONDANSETRON HCL 4 MG/2ML IJ SOLN
INTRAMUSCULAR | Status: DC | PRN
  Administered 2021-12-30: 4 mg via INTRAVENOUS

## 2021-12-30 MED ORDER — PROPOFOL 10 MG/ML IV BOLUS
INTRAVENOUS | Status: DC | PRN
  Administered 2021-12-30: 20 mg via INTRAVENOUS
  Administered 2021-12-30: 200 mg via INTRAVENOUS
  Administered 2021-12-30: 20 mg via INTRAVENOUS

## 2021-12-30 MED ORDER — MIDAZOLAM HCL 2 MG/2ML IJ SOLN
INTRAMUSCULAR | Status: AC
  Filled 2021-12-30: qty 2

## 2021-12-30 MED ORDER — 0.9 % SODIUM CHLORIDE (POUR BTL) OPTIME
TOPICAL | Status: DC | PRN
  Administered 2021-12-30: 500 mL

## 2021-12-30 MED ORDER — DEXAMETHASONE SODIUM PHOSPHATE 4 MG/ML IJ SOLN
INTRAMUSCULAR | Status: DC | PRN
  Administered 2021-12-30: 10 mg via INTRAVENOUS

## 2021-12-30 MED ORDER — CEFAZOLIN SODIUM-DEXTROSE 2-4 GM/100ML-% IV SOLN
2.0000 g | INTRAVENOUS | Status: AC
  Administered 2021-12-30: 2 g via INTRAVENOUS

## 2021-12-30 MED ORDER — EPHEDRINE 5 MG/ML INJ
INTRAVENOUS | Status: AC
  Filled 2021-12-30: qty 10

## 2021-12-30 MED ORDER — LIDOCAINE 2% (20 MG/ML) 5 ML SYRINGE
INTRAMUSCULAR | Status: DC | PRN
  Administered 2021-12-30: 60 mg via INTRAVENOUS

## 2021-12-30 MED ORDER — BUPIVACAINE HCL (PF) 0.25 % IJ SOLN
INTRAMUSCULAR | Status: DC | PRN
  Administered 2021-12-30: 10 mL

## 2021-12-30 MED ORDER — MEPERIDINE HCL 25 MG/ML IJ SOLN: 6.2500 mg | INTRAMUSCULAR | Status: DC | PRN

## 2021-12-30 MED ORDER — HYDROMORPHONE HCL 1 MG/ML IJ SOLN: 0.2500 mg | INTRAMUSCULAR | Status: DC | PRN

## 2021-12-30 MED ORDER — FENTANYL CITRATE (PF) 100 MCG/2ML IJ SOLN
INTRAMUSCULAR | Status: AC
  Filled 2021-12-30: qty 2

## 2021-12-30 MED ORDER — IOHEXOL 300 MG/ML  SOLN
INTRAMUSCULAR | Status: DC | PRN
  Administered 2021-12-30: 40 mL

## 2021-12-30 SURGICAL SUPPLY — 64 items
ADH SKN CLS APL DERMABOND .7 (GAUZE/BANDAGES/DRESSINGS) ×2
BAG DRAIN URO-CYSTO SKYTR STRL (DRAIN) ×3 IMPLANT
BAG DRN RND TRDRP ANRFLXCHMBR (UROLOGICAL SUPPLIES) ×2
BAG DRN UROCATH (DRAIN) ×2
BAG URINE DRAIN 2000ML AR STRL (UROLOGICAL SUPPLIES) ×1 IMPLANT
BAG URINE LEG 500ML (DRAIN) IMPLANT
BALLN NEPHROSTOMY (BALLOONS)
BALLN OPTILUME DCB 30X3X75 (BALLOONS)
BALLN OPTILUME DCB 30X5X75 (BALLOONS) ×3
BALLOON NEPHROSTOMY (BALLOONS) IMPLANT
BALLOON OPTILUME DCB 30X3X75 (BALLOONS) IMPLANT
BALLOON OPTILUME DCB 30X5X75 (BALLOONS) IMPLANT
BLADE CLIPPER SENSICLIP SURGIC (BLADE) ×1 IMPLANT
BLADE SURG 15 STRL LF DISP TIS (BLADE) ×2 IMPLANT
BLADE SURG 15 STRL SS (BLADE) ×3
BNDG GAUZE ELAST 4 BULKY (GAUZE/BANDAGES/DRESSINGS) ×3 IMPLANT
CANISTER SUCT 1200ML W/VALVE (MISCELLANEOUS) IMPLANT
CATH FOLEY 2WAY SLVR  5CC 18FR (CATHETERS) ×3
CATH FOLEY 2WAY SLVR  5CC 20FR (CATHETERS)
CATH FOLEY 2WAY SLVR  5CC 22FR (CATHETERS)
CATH FOLEY 2WAY SLVR 5CC 18FR (CATHETERS) IMPLANT
CATH FOLEY 2WAY SLVR 5CC 20FR (CATHETERS) IMPLANT
CATH FOLEY 2WAY SLVR 5CC 22FR (CATHETERS) IMPLANT
CATH INTERMIT  6FR 70CM (CATHETERS) ×1 IMPLANT
CLEANER CAUTERY TIP 5X5 PAD (MISCELLANEOUS) ×2 IMPLANT
CLOTH BEACON ORANGE TIMEOUT ST (SAFETY) ×5 IMPLANT
COVER BACK TABLE 60X90IN (DRAPES) ×3 IMPLANT
COVER MAYO STAND STRL (DRAPES) ×2 IMPLANT
DERMABOND ADVANCED (GAUZE/BANDAGES/DRESSINGS) ×1
DERMABOND ADVANCED .7 DNX12 (GAUZE/BANDAGES/DRESSINGS) ×2 IMPLANT
DISSECTOR ROUND CHERRY 3/8 STR (MISCELLANEOUS) IMPLANT
DRAIN PENROSE 0.25X18 (DRAIN) ×3 IMPLANT
DRAPE LAPAROTOMY 100X72 PEDS (DRAPES) ×3 IMPLANT
ELECT NDL TIP 2.8 STRL (NEEDLE) IMPLANT
ELECT NEEDLE TIP 2.8 STRL (NEEDLE) IMPLANT
ELECT REM PT RETURN 9FT ADLT (ELECTROSURGICAL) ×3
ELECTRODE REM PT RTRN 9FT ADLT (ELECTROSURGICAL) IMPLANT
GAUZE 4X4 16PLY ~~LOC~~+RFID DBL (SPONGE) ×3 IMPLANT
GLOVE BIO SURGEON STRL SZ8 (GLOVE) ×3 IMPLANT
GOWN STRL REUS W/TWL XL LVL3 (GOWN DISPOSABLE) ×6 IMPLANT
GUIDEWIRE ANG ZIPWIRE 038X150 (WIRE) ×1 IMPLANT
GUIDEWIRE STR DUAL SENSOR (WIRE) ×1 IMPLANT
HOLDER FOLEY CATH W/STRAP (MISCELLANEOUS) ×1 IMPLANT
KIT TURNOVER CYSTO (KITS) ×3 IMPLANT
MANIFOLD NEPTUNE II (INSTRUMENTS) ×3 IMPLANT
NEEDLE HYPO 22GX1.5 SAFETY (NEEDLE) ×1 IMPLANT
NS IRRIG 500ML POUR BTL (IV SOLUTION) ×3 IMPLANT
PACK BASIN DAY SURGERY FS (CUSTOM PROCEDURE TRAY) ×3 IMPLANT
PACK CYSTO (CUSTOM PROCEDURE TRAY) ×3 IMPLANT
PAD CLEANER CAUTERY TIP 5X5 (MISCELLANEOUS) ×1
PENCIL SMOKE EVACUATOR (MISCELLANEOUS) ×3 IMPLANT
PLUG CATH AND CAP STER (CATHETERS) ×1 IMPLANT
SUPPORT SCROTAL LG STRP (MISCELLANEOUS) ×3 IMPLANT
SUT CHROMIC 2 0 SH (SUTURE) ×3 IMPLANT
SUT CHROMIC 3 0 SH 27 (SUTURE) ×1 IMPLANT
SUT CHROMIC 4 0 SH 27 (SUTURE) ×1 IMPLANT
SUT MNCRL AB 4-0 PS2 18 (SUTURE) ×3 IMPLANT
SYR CONTROL 10ML LL (SYRINGE) IMPLANT
TOWEL OR 17X26 10 PK STRL BLUE (TOWEL DISPOSABLE) ×3 IMPLANT
TRAY DSU PREP LF (CUSTOM PROCEDURE TRAY) ×3 IMPLANT
TUBE CONNECTING 12X1/4 (SUCTIONS) ×3 IMPLANT
WATER STERILE IRR 3000ML UROMA (IV SOLUTION) ×2 IMPLANT
WATER STERILE IRR 500ML POUR (IV SOLUTION) ×3 IMPLANT
YANKAUER SUCT BULB TIP NO VENT (SUCTIONS) ×3 IMPLANT

## 2021-12-30 NOTE — Op Note (Signed)
Preoperative diagnosis: Urethral stricture, large right hydrocele  Postoperative diagnosis: Same  Principal procedure: Cystoscopy, Optilume balloon dilation of urethral stricture, right hydrocelectomy  Surgeon: Nial Hawe  Anesthesia: General with LMA  Complications: None  Drains: Penrose drain in right hemiscrotum  Estimated blood loss: Less than 25 mL  Specimen: None  Indications: 61 year old male with history of bulbous urethral stricture with significant lower urinary tract symptoms currently as well as an enlarging, symptomatic right hydrocele.  The patient has not had cystoscopy in the office as he is significantly distressed by the procedure.  He presents at this time for cystoscopy, possible OPTILUME balloon dilation of urethral stricture as well as right hydrocelectomy.  Findings: There were 2 significant strictures, 1 in the bulbous urethra which was moderate in nature and 1 significant membranous urethral stricture.  I was able to get the beak of the scope by the bulbous urethral stricture but it was difficult to advance a guidewire at first through the more proximal stricture.  This was successfully intubated with a zip wire.  Urothelium of the bladder appeared normal although it was a large residual.  Description of procedure: Patient was properly identified and marked in the holding area, then taken to the operating room where general anesthetic was administered with the LMA.  He is placed in the dorsolithotomy position.  Genitalia and perineum were prepped, draped, proper timeout performed.  21 French panendoscope advanced through the urethra.  The bulbous urethral stricture was encountered.  I was able to traverse this with the beak of the scope.  First with the sensor tip and then eventually with a zip wire was able to intubate the more proximal stricture.  The guidewire was seen curled in the bladder.  I then switched out the guidewires-zip wire for a sensor tip guidewire  using an open-ended catheter.  I then remove the open-ended catheter and the zip wire was left indwelling.  I passed a 50 mm 30 French Optilume balloon over top of the guidewire.  Using cystoscope, I positioned it more proximally at first.  This traversed the more proximal stricture.  It was then inflated to 12 atm for 6 minutes.  It was then deflated and I pulled it more distally traversing the more distal bulbous urethral stricture.  Same procedure was done for another 6 minutes.  At this point the balloon was deflated and removed.  I then easily passed the scope through both dilated strictures into the bladder which was then inspected.  Bladder was drained.  I passed an 64 French Foley catheter into the bladder, balloon filled with 10 cc of water and the bladder drained.  The catheter was then plugged.  The patient was then placed in the supine position.  Penis was retracted superiorly, the scrotum and perineum were prepped and draped.  Second timeout was performed.  Incision made in the median raphae anteriorly and carried down to the tunica vaginalis with electrocautery.  The hydrocele was then bluntly dissected out.  It was then opened anteriorly, 250 cc of fluid were removed.  Cord block with 10 cc of quarter percent Marcaine.  The tunica vaginalis was then imbricated posteriorly with a running 2-0 chromic.  I then placed a drain through the right lower hemiscrotum.  Sutured to the skin with a 2-0 chromic and trimmed.  The testicle was then placed in the right hemiscrotum, with the dark toes being run closed with the same 2-0 chromic and placed in a simple running fashion.  Skin edges were  reapproximated using a 4-0 Monocryl in a running simple fashion.  Dermabond was placed.  Fluffs were placed, a jockstrap over top of this, and then the patient was awakened, taken to the PACU in stable condition having tolerated the procedure well.  Sponge needle instrument counts correct.

## 2021-12-30 NOTE — Anesthesia Postprocedure Evaluation (Signed)
Anesthesia Post Note  Patient: Mark Gonzalez.  Procedure(s) Performed: HYDROCELECTOMY ADULT (Right: Scrotum) CYSTOSCOPY WITH   OPTILUME BALLOON DILATION OF URETHRAL STRICTURE (Urethra)     Patient location during evaluation: PACU Anesthesia Type: General Level of consciousness: awake and alert Pain management: pain level controlled Vital Signs Assessment: post-procedure vital signs reviewed and stable Respiratory status: spontaneous breathing, nonlabored ventilation and respiratory function stable Cardiovascular status: blood pressure returned to baseline and stable Postop Assessment: no apparent nausea or vomiting Anesthetic complications: no   No notable events documented.  Last Vitals:  Vitals:   12/30/21 1100 12/30/21 1115  BP: 112/69 124/81  Pulse: (!) 47   Resp: (!) 8   Temp: (!) 36.3 C   SpO2: 100%     Last Pain:  Vitals:   12/30/21 1130  TempSrc:   PainSc: 2                  Lowella Curb

## 2021-12-30 NOTE — Anesthesia Procedure Notes (Signed)
Procedure Name: LMA Insertion Date/Time: 12/30/2021 9:15 AM Performed by: Dairl Ponder, CRNA Pre-anesthesia Checklist: Patient identified, Emergency Drugs available, Suction available and Patient being monitored Patient Re-evaluated:Patient Re-evaluated prior to induction Oxygen Delivery Method: Circle System Utilized Preoxygenation: Pre-oxygenation with 100% oxygen Induction Type: IV induction Ventilation: Mask ventilation without difficulty LMA: LMA inserted LMA Size: 4.0 Number of attempts: 1 Airway Equipment and Method: Bite block Placement Confirmation: positive ETCO2 Tube secured with: Tape Dental Injury: Teeth and Oropharynx as per pre-operative assessment

## 2021-12-30 NOTE — Transfer of Care (Signed)
Immediate Anesthesia Transfer of Care Note  Patient: Mark Gonzalez.  Procedure(s) Performed: HYDROCELECTOMY ADULT (Right: Scrotum) CYSTOSCOPY WITH   OPTILUME BALLOON DILATION OF URETHRAL STRICTURE (Urethra)  Patient Location: PACU  Anesthesia Type:General  Level of Consciousness: awake, alert  and oriented  Airway & Oxygen Therapy: Patient Spontanous Breathing  Post-op Assessment: Report given to RN and Post -op Vital signs reviewed and stable  Post vital signs: Reviewed and stable  Last Vitals:  Vitals Value Taken Time  BP    Temp    Pulse 54 12/30/21 1036  Resp 11 12/30/21 1036  SpO2 99 % 12/30/21 1036  Vitals shown include unvalidated device data.  Last Pain:  Vitals:   12/30/21 0730  TempSrc: Oral  PainSc: 0-No pain      Patients Stated Pain Goal: 5 (12/30/21 0730)  Complications: No notable events documented.

## 2021-12-30 NOTE — Anesthesia Preprocedure Evaluation (Signed)
Anesthesia Evaluation  Patient identified by MRN, date of birth, ID band Patient awake    Reviewed: Allergy & Precautions, NPO status , Patient's Chart, lab work & pertinent test results  Airway Mallampati: II  TM Distance: >3 FB Neck ROM: Full    Dental no notable dental hx.    Pulmonary neg pulmonary ROS, former smoker,    Pulmonary exam normal breath sounds clear to auscultation       Cardiovascular hypertension, Pt. on medications Normal cardiovascular exam+ dysrhythmias Atrial Fibrillation  Rhythm:Regular Rate:Normal     Neuro/Psych negative neurological ROS  negative psych ROS   GI/Hepatic negative GI ROS, Neg liver ROS,   Endo/Other    Renal/GU negative Renal ROS  negative genitourinary   Musculoskeletal  (+) Arthritis , Osteoarthritis,    Abdominal   Peds negative pediatric ROS (+)  Hematology negative hematology ROS (+)   Anesthesia Other Findings   Reproductive/Obstetrics negative OB ROS                             Anesthesia Physical  Anesthesia Plan  ASA: III  Anesthesia Plan: General   Post-op Pain Management: Dilaudid IV and Minimal or no pain anticipated   Induction: Intravenous  PONV Risk Score and Plan: 2 and Ondansetron, Treatment may vary due to age or medical condition and Midazolam  Airway Management Planned: LMA  Additional Equipment:   Intra-op Plan:   Post-operative Plan: Extubation in OR  Informed Consent: I have reviewed the patients History and Physical, chart, labs and discussed the procedure including the risks, benefits and alternatives for the proposed anesthesia with the patient or authorized representative who has indicated his/her understanding and acceptance.     Dental advisory given  Plan Discussed with: CRNA and Surgeon  Anesthesia Plan Comments:         Anesthesia Quick Evaluation

## 2021-12-30 NOTE — H&P (Signed)
H&P  Chief Complaint: Difficulty urinating, right scrotal swelling  History of Present Illness: 61 year old male who presents today for cystoscopy, possible Optilume  balloon balloon dilation of urethral stricture, right hydrocelectomy.  He has had an enlarging, quite symptomatic right hydrocele for some time.  Recent ultrasound revealed a normal testicle but a large 10 cm plus hydrocele.  Additionally, he has a history of bulbous urethral stricture.  This has been dilated in the past.  The patient has refused office cystoscopy, deferring this to the present procedure.  I have discussed hydrocelectomy, risks, benefits, expected outcomes as well as possible balloon dilation with a drug-eluting balloon.  He desires to proceed.  Past Medical History:  Diagnosis Date   Acquired left foot drop    per pt residual post lumbar fusion 02/ 2021   Anticoagulant long-term use    eliquis--- managed by cardiology   Chronic diastolic CHF (congestive heart failure) (HCC)    ED (erectile dysfunction)    Edema of both lower extremities    intermittant   H/O: urethral stricture    History of cellulitis 09/24/2015   left leg   History of gastroesophageal reflux (GERD)    History of pericarditis 09/2013   admission few times during this month,  acute,  felt related to remicade-- resolved   Hydrocele, right    Hyperlipidemia    Hypertension    Lower urinary tract symptoms (LUTS)    urologist-- dr Retta Diones   Morbid obesity (HCC)    NICM (nonischemic cardiomyopathy) (HCC)    a. EF 45-50% in setting of AF in past; b. Neg MV;  c. 04/2010 Echo: EF 55-60%;  d. 11/2011 Cath: nl cors.   Paroxysmal atrial fibrillation [facility])    cardiologist--- ;   hx multiple DCCV  last one 07-11-2013   Psoriasis    Psoriatic arthritis [facility])    rheumatology--- dr Dierdre Forth --- primarily in my hands   Spermatocele    chronic   Wears contact lenses     Past Surgical History:  Procedure Laterality Date   ANTERIOR LAT LUMBAR  FUSION Right 09/24/2019   Procedure: Right Lumbar Three-Four Lumbar Four-Five Anterolateral decompression/fusion/percutaneous pedicle screw fixation;  Surgeon: Barnett Abu, MD;  Location: [facility] OR;  Service: Neurosurgery;  Laterality: Right;  anterolateral   APPENDECTOMY     APPLICATION OF ROBOTIC ASSISTANCE FOR SPINAL PROCEDURE N/A 09/24/2019   Procedure: APPLICATION OF ROBOTIC ASSISTANCE FOR SPINAL PROCEDURE;  Surgeon: Barnett Abu, MD;  Location: [facility] OR;  Service: Neurosurgery;  Laterality: N/A;   CARDIOVERSION  08/16/2012   Procedure: CARDIOVERSION;  Surgeon: Dolores Patty, MD;  Location: [facility] ENDOSCOPY;  Service: Cardiovascular;  Laterality: N/A;   CARDIOVERSION N/A 06/05/2013   Procedure: CARDIOVERSION;  Surgeon: Dolores Patty, MD;  Location: [facility] OR;  Service: Cardiovascular;  Laterality: N/A;   CARDIOVERSION N/A 07/11/2014   Procedure: CARDIOVERSION;  Surgeon: Dolores Patty, MD;  Location: [facility] ENDOSCOPY;  Service: Cardiovascular;  Laterality: N/A;   COLONOSCOPY     LEFT HEART CATHETERIZATION WITH CORONARY ANGIOGRAM N/A 12/22/2011   Procedure: LEFT HEART CATHETERIZATION WITH CORONARY ANGIOGRAM;  Surgeon: Dolores Patty, MD;  Location: [facility] C[facility] LAB;  Service: Cardiovascular;  Laterality: N/A;   LUMBAR PERCUTANEOUS PEDICLE SCREW 2 LEVEL N/A 09/24/2019   Procedure: Introlateral Decompression Lumbar Three-Four with XLIFT Spacer, Arthrodesis with Allograft, Posterior Fixation Lumbar Three-Five with Percutaneous screws with Robotic Assistance. Laminectomy Lumbar Four-Five Posterior Lumbar Interbody Arthrodesis Unilateral Lumbar four-Five On Left, Posterior Lateral Arthrodesis Lumbar Four-Five Bilaterally.;  Surgeon:  Barnett Abu, MD;  Location: [facility] OR   ORIF METATARSAL FRACTURE  2001   "5th"   PERONEAL NERVE DECOMPRESSION Left 12/05/2019   Procedure: Left PERONEAL NERVE DECOMPRESSION;  Surgeon: Barnett Abu, MD;  Location: [facility] OR;  Service: Neurosurgery;  Laterality: Left;  3C    TONSILLECTOMY      Home Medications:    Allergies:  Allergies  Allergen Reactions   Lisinopril Cough    Family History  Problem Relation Age of Onset   Cancer Mother    Cancer Father    Heart disease Neg Hx     Social History:  reports that he quit smoking about 31 years ago. His smoking use included cigarettes. He has a 8.00 pack-year smoking history. He has never used smokeless tobacco. He reports that he does not currently use alcohol. He reports current drug use. Drug: Marijuana.  ROS: A complete review of systems was performed.  All systems are negative except for pertinent findings as noted.  Physical Exam:  Vital signs in last 24 hours: BP 125/77   Pulse (!) 55   Temp (!) 97.2 F (36.2 C) (Oral)   Resp 17   Ht 6' (1.829 m)   Wt 89.3 kg   SpO2 100%   BMI 26.70 kg/m  Constitutional:  Alert and oriented, No acute distress Cardiovascular: Regular rate  Respiratory: Normal respiratory effort GI: Abdomen is soft, nontender, nondistended, no abdominal masses. No CVAT.  Genitourinary: Large right scrotal swelling, testicle nonpalpable.  Normal left testicle Lymphatic: No lymphadenopathy Neurologic: Grossly intact, no focal deficits Psychiatric: Normal mood and affect  I have reviewed prior pt notes  I have independently reviewed prior imaging    Impression/Assessment:  Right hydrocele, symptomatic, possible urethral stricture  Plan:  Cystoscopy, possible Optilume balloon dilation of urethral stricture, right hydrocelectomy

## 2021-12-30 NOTE — Discharge Instructions (Addendum)
Scrotal surgery postoperative instructions  Wound:  In most cases your incision will have absorbable sutures that will dissolve within the first 2-3 weeks. Some will fall out even earlier. Expect some redness as the sutures dissolve but this should occur only around the sutures. If there is generalized redness, especially with increasing pain or swelling, let us know. The scrotum will very likely get "black and blue" as the blood in the tissues spread. Sometimes the whole scrotum will turn colors. The black and blue is followed by a yellow and brown color. In time, all the discoloration will go away. In some cases some firm swelling in the area of the testicle may persist for up to 4-6 weeks after the surgery and is considered normal in most cases.  Drain:  If the surgeon placed a drain through the bottom part of your scrotum, it is held in with a small stitch. in 5 days, cut the small stitch and slide to drain out. Once the drain has been removed, a small hole made drain out for another day or 2. If so, keep a clean washcloth underneath your supportive undergarment, or sterile gauze. Until the hole seals up, all bathing should be in the shower, and not in the bathtub.  Diet:  You may return to your normal diet within 24 hours following your surgery. You may note some mild nausea and possibly vomiting the first 6-8 hours following surgery. This is usually due to the side effects of anesthesia, and will disappear quite soon. I would suggest clear liquids and a very light meal the first evening following your surgery.  Activity:  Your physical activity should be restricted the first 48 hours. During that time you should remain relatively inactive, moving about only when necessary. During the first 7-10 days following surgery you should avoid lifting any heavy objects (anything greater than 15 pounds), and avoid strenuous exercise. If you work, ask [place] specifically about your restrictions, both for work  and home. We will write a note to your employer if needed.  You should plan to wear a tight pair of jockey shorts or an athletic supporter for the first 4-5 days, even to sleep. This will keep the scrotum immobilized to some degree and keep the swelling down. You may find it more comfortable to wear a support longer.  Ice packs should be placed on and off over the scrotum for the first 48 hours. Frozen peas or corn in a ZipLock bag can be frozen, used and re-frozen. Fifteen minutes on and 15 minutes off is a reasonable schedule. The ice is a good pain reliever and keeps the swelling down.  Hygiene:  You may shower 48 hours after your surgery. Tub bathing should be restricted until the seventh day.  Medication:  You will be sent home with some type of pain medication. In many cases you will be sent home with a narcotic pain pill (Vicodin or Tylox). If the pain is not too bad, you may take either Tylenol (acetaminophen) or Advil (ibuprofen) which contain no narcotic agents, and might be tolerated a little better, with fewer side effects. If the pain medication you are sent home with does not control the pain, you will have to let us know. Some narcotic pain medications cannot be given or refilled by a phone call to a pharmacy.  Problems you should report to [place]:  Fever of 101.0 degrees Fahrenheit or greater. Moderate or severe swelling under the skin incision or involving the scrotum. Drug  reaction such as hives, a rash, nausea or vomiting.  It is okay to remove your catheter on Friday morning.  If no significant oozing from wound site, it is okay to resume Eliquis on Friday    No ibuprofen, Advil, Aleve, Motrin, ketorolac, meloxicam, naproxen, or other NSAIDS until after 4:18pm today if needed for pain.      Post Anesthesia Home Care Instructions  Activity: Get plenty of rest for the remainder of the day. A responsible individual must stay with you for 24 hours following the  procedure.  For the next 24 hours, DO NOT: -Drive a car -Advertising copywriter -Drink alcoholic beverages -Take any medication unless instructed by your physician -Make any legal decisions or sign important papers.  Meals: Start with liquid foods such as gelatin or soup. Progress to regular foods as tolerated. Avoid greasy, spicy, heavy foods. If nausea and/or vomiting occur, drink only clear liquids until the nausea and/or vomiting subsides. Call your physician if vomiting continues.  Special Instructions/Symptoms: Your throat may feel dry or sore from the anesthesia or the breathing tube placed in your throat during surgery. If this causes discomfort, gargle with warm salt water. The discomfort should disappear within 24 hours.

## 2021-12-30 NOTE — Interval H&P Note (Signed)
History and Physical Interval Note:  12/30/2021 9:04 AM  Mark Gonzalez.  has presented today for surgery, with the diagnosis of RIGHT HYDROCELE, POSS URETHRAL STRICTURE.  The various methods of treatment have been discussed with the patient and family. After consideration of risks, benefits and other options for treatment, the patient has consented to  Procedure(s): HYDROCELECTOMY ADULT (Right) CYSTOSCOPY WITH  POSSIBLE OPTILUME BALLOON DILATION OF URETHRAL STRICTURE (N/A) as a surgical intervention.  The patient's history has been reviewed, patient examined, no change in status, stable for surgery.  I have reviewed the patient's chart and labs.  Questions were answered to the patient's satisfaction.     Bertram Millard Benedetta Sundstrom

## 2021-12-31 ENCOUNTER — Encounter (HOSPITAL_BASED_OUTPATIENT_CLINIC_OR_DEPARTMENT_OTHER): Payer: Self-pay | Admitting: Urology

## 2022-01-18 DIAGNOSIS — F4321 Adjustment disorder with depressed mood: Secondary | ICD-10-CM | POA: Diagnosis not present

## 2022-01-20 DIAGNOSIS — N430 Encysted hydrocele: Secondary | ICD-10-CM | POA: Diagnosis not present

## 2022-02-07 ENCOUNTER — Ambulatory Visit (HOSPITAL_COMMUNITY)
Admission: RE | Admit: 2022-02-07 | Discharge: 2022-02-07 | Disposition: A | Discharge status: Home / Self Care | Payer: BC Managed Care – PPO | Source: Ambulatory Visit | Attending: Internal Medicine | Admitting: Internal Medicine

## 2022-02-07 ENCOUNTER — Encounter (HOSPITAL_COMMUNITY): Payer: Self-pay | Admitting: Internal Medicine

## 2022-02-07 ENCOUNTER — Other Ambulatory Visit (HOSPITAL_COMMUNITY): Payer: Self-pay | Admitting: Internal Medicine

## 2022-02-07 VITALS — BP 118/70 | HR 68 | Wt 186.4 lb

## 2022-02-07 DIAGNOSIS — I3139 Other pericardial effusion (noninflammatory): Secondary | ICD-10-CM | POA: Diagnosis not present

## 2022-02-07 DIAGNOSIS — I4891 Unspecified atrial fibrillation: Secondary | ICD-10-CM

## 2022-02-07 DIAGNOSIS — M21372 Foot drop, left foot: Secondary | ICD-10-CM | POA: Diagnosis not present

## 2022-02-07 DIAGNOSIS — Z79899 Other long term (current) drug therapy: Secondary | ICD-10-CM | POA: Insufficient documentation

## 2022-02-07 DIAGNOSIS — L4050 Arthropathic psoriasis, unspecified: Secondary | ICD-10-CM | POA: Insufficient documentation

## 2022-02-07 DIAGNOSIS — I480 Paroxysmal atrial fibrillation: Secondary | ICD-10-CM | POA: Diagnosis not present

## 2022-02-07 DIAGNOSIS — E785 Hyperlipidemia, unspecified: Secondary | ICD-10-CM | POA: Insufficient documentation

## 2022-02-07 DIAGNOSIS — Z7901 Long term (current) use of anticoagulants: Secondary | ICD-10-CM | POA: Insufficient documentation

## 2022-02-07 DIAGNOSIS — I509 Heart failure, unspecified: Secondary | ICD-10-CM | POA: Diagnosis not present

## 2022-02-07 DIAGNOSIS — I110 Hypertensive heart disease with heart failure: Secondary | ICD-10-CM | POA: Insufficient documentation

## 2022-02-07 DIAGNOSIS — Z6825 Body mass index (BMI) 25.0-25.9, adult: Secondary | ICD-10-CM | POA: Diagnosis not present

## 2022-02-07 MED ORDER — DOFETILIDE 500 MCG PO CAPS: 500.0000 ug | ORAL_CAPSULE | Freq: Two times a day (BID) | ORAL | 3 refills | Status: DC

## 2022-02-07 MED ORDER — DOFETILIDE 500 MCG PO CAPS: 500.0000 ug | ORAL_CAPSULE | Freq: Two times a day (BID) | ORAL | 0 refills | Status: DC

## 2022-02-07 NOTE — Patient Instructions (Signed)
Your physician recommends that you schedule a follow-up appointment in: 1 year, **PLEASE CALL OUR OFFICE IN MAY 2024 TO SCHEDULE THIS APPOINTMENT  If you have any questions or concerns before your next appointment please send [place] a message through [place] or call our office at [phone].    TO LEAVE A MESSAGE FOR THE NURSE SELECT OPTION 2, PLEASE LEAVE A MESSAGE INCLUDING: YOUR NAME DATE OF BIRTH CALL BACK NUMBER REASON FOR CALL**this is important as we prioritize the call backs  YOU WILL RECEIVE A CALL BACK THE SAME DAY AS LONG AS YOU CALL BEFORE 4:00 PM

## 2022-02-07 NOTE — Progress Notes (Signed)
Patient ID: Mark Gonzalez., male   DOB: 1961-01-26, 62 y.o.   MRN: [id number] . Advanced Heart Failure Clinic Note  PCP: Koirala, Dibas, MD  Subjective:    Mark Gonzalez is a very pleasant 61 y/o male with a history of psoriatic arthritis, obesity, hypertension, and atrial fibrillation.   Admitted several times in March 2015 with acute pericarditis. Treated with colchicine and steroids. Had recurrence and readmitted. Echo with moderate pericardial effusion. Started on prednisone 60 daily. Effusion resolved after 1 week. Remicade stopped as it was thought that it might be causative. Has not recurred.  Previously was working with [facility] and lost a lot of weight. On 09/24/19 had spinal surgery which went well except for LE edema. Now had developed peroneal neuropathy with left foot drop. Had peroneal nerve release in 5/21.    Failed flecainide and now maintained in NSR on Tikosyn with intermittent breakthrough.   He returns for  Heart Failure/ A fib follow up. Feels the best he has felt in long time. Over the last year he has lost 260 pounds. Denies SOB/PND/Orthopnea. No bleeding issues. Appetite ok. No fever or chills. Weight at home down to 180  pounds. Taking all medications.   11/2021 Creatinine 0.7 Hgb 14.6   Current Outpatient Medications on File Prior to Encounter  Medication Sig Dispense Refill   apixaban (ELIQUIS) 5 MG TABS tablet TAKE 1 TABLET(5 MG) BY MOUTH TWICE DAILY 180 tablet 1   b complex vitamins tablet Take 1 tablet by mouth daily.     Cholecalciferol (VITAMIN D-3) 125 MCG (5000 UT) TABS Take 5,000 Units by mouth daily.     diclofenac (VOLTAREN) 75 MG EC tablet Take 75 mg by mouth daily.   0   dofetilide (TIKOSYN) 500 MCG capsule TAKE 1 CAPSULE(500 MCG) BY MOUTH TWICE DAILY NEEDS FOLLOW UP APPOINTMENT FOR ANYMORE REFILLS 60 capsule 0   Efinaconazole 10 % SOLN Apply 1 drop topically daily. 4 mL 11   furosemide (LASIX) 40 MG tablet Take 40 mg by mouth 2 (two) times a week.      golimumab (SIMPONI ARIA) 50 MG/4ML SOLN injection Inject into the vein every 8 (eight) weeks.     irbesartan (AVAPRO) 75 MG tablet Take 75 mg by mouth at bedtime.     loratadine (CLARITIN) 10 MG tablet Take 10 mg by mouth daily.     lovastatin (MEVACOR) 20 MG tablet Take 1 tablet (20 mg total) by mouth daily at 6 PM. 90 tablet 1   Magnesium 400 MG CAPS Take 400 mg by mouth every evening.      Multiple Vitamin (MULTIVITAMIN WITH MINERALS) TABS tablet Take 1 tablet by mouth daily.     Multiple Vitamins-Minerals (PRESERVISION AREDS 2 PO) Take 1 tablet by mouth in the morning and at bedtime.     Omega-3 Fatty Acids (FISH OIL ULTRA) 1400 MG CAPS Take 1,400 mg by mouth 2 (two) times daily.     potassium chloride SA (KLOR-CON M) 20 MEQ tablet Take 40 mEq by mouth 2 (two) times a week.     tadalafil (CIALIS) 10 MG tablet Take 10 mg by mouth daily as needed for erectile dysfunction.     No current facility-administered medications on file prior to encounter.    Objective:       Weight change: Vitals:   02/07/22 1348  BP: 118/70  Pulse: 68  SpO2: 99%  Weight: 84.6 kg (186 lb 6.4 oz)   Filed Weights   02/07/22  1348  Weight: 84.6 kg (186 lb 6.4 oz)   Wt Readings from Last 3 Encounters:  02/07/22 84.6 kg (186 lb 6.4 oz)  12/30/21 89.3 kg (196 lb 14.4 oz)  03/01/21 (!) 151.8 kg (334 lb 9.6 oz)    Physical Exam: General:  Well appearing. No resp difficulty HEENT: normal Neck: supple. no JVD. Carotids 2+ bilat; no bruits. No lymphadenopathy or thryomegaly appreciated. Cor: PMI nondisplaced. Regular rate & rhythm. No rubs, gallops or murmurs. Lungs: clear Abdomen: soft, nontender, nondistended. No hepatosplenomegaly. No bruits or masses. Good bowel sounds. Extremities: no cyanosis, clubbing, rash, edema Neuro: alert & orientedx3, cranial nerves grossly intact. moves all 4 extremities w/o difficulty. Affect pleasant    Assessment/Plan:    1. PAF in NSR on Tikosyn - stil with  intermittent breakthroughs  -s/p previous AF ablation at [facility]   - AF well controlled on Tikosyn with intermittent breakthroughs. QTC ok. - EKG today   - Continue Eliquis.   -Avoid ETOH 2. Morbid obesity  -Body mass index is 25.28 kg/m. -Weight down to his goal 180 pounds.  3. Psoriatic Arthritis  - Follows with Dr. Dierdre Forth 4. HTN  -Stable  5. H/o pericarditis felt to be related to Remicade 2015 6. Hyperlipidemia - Followed by PCP but hasn't seen in over 1 year. Will get labs  7. LE edema - Resolved   Follow up in 1 year.   [name], NP  2:14 PM  Patient seen and examined with the above-signed Advanced Practice Provider and/or Housestaff. I personally reviewed laboratory data, imaging studies and relevant notes. I independently examined the patient and formulated the important aspects of the plan. I have edited the note to reflect any of my changes or salient points. I have personally discussed the plan with the patient and/or family.  Doing well. Has lost 160 pounds recently.No recurrent AF. No bleeding on Eliquis.   General:  Well appearing. No resp difficulty HEENT: normal Neck: supple. no JVD. Carotids 2+ bilat; no bruits. No lymphadenopathy or thryomegaly appreciated. Cor: PMI nondisplaced. Regular rate & rhythm. No rubs, gallops or murmurs. Lungs: clear Abdomen: soft, nontender, nondistended. No hepatosplenomegaly. No bruits or masses. Good bowel sounds. Extremities: no cyanosis, clubbing, rash, edema Neuro: alert & orientedx3, cranial nerves grossly intact. moves all 4 extremities w/o difficulty. Affect pleasant  Doing well. Congratulated him on weight loss. No recurrent AF. Will continue dofetilide. QTc . Recent labs ok.   Arvilla Meres, MD  5:03 PM

## 2022-02-16 DIAGNOSIS — L4050 Arthropathic psoriasis, unspecified: Secondary | ICD-10-CM | POA: Diagnosis not present

## 2022-02-21 DIAGNOSIS — F4321 Adjustment disorder with depressed mood: Secondary | ICD-10-CM | POA: Diagnosis not present

## 2022-03-24 ENCOUNTER — Ambulatory Visit: Payer: BC Managed Care – PPO | Admitting: Podiatry

## 2022-03-24 DIAGNOSIS — B351 Tinea unguium: Secondary | ICD-10-CM

## 2022-03-27 DIAGNOSIS — B351 Tinea unguium: Secondary | ICD-10-CM | POA: Insufficient documentation

## 2022-03-27 NOTE — Progress Notes (Signed)
Subjective: Chief Complaint  Patient presents with   Nail Problem    Pt came in for a bilateral nail fungus, patient states that he is doing better this visit, pt denies any pain    61 year old male presents with above complaints.  States he is doing well.  He has been using the [place] daily it seems that it started to work.  No swelling redness or drainage to the toenail sites.  Objective: AAO x3, NAD DP/PT pulses palpable bilaterally, CRT less than 3 seconds Nails remain hypertrophic, dystrophic with yellow, brown discoloration but there is some clearing along the proximal nail fold.  No pain in the nails there is no swelling or redness or any signs of infection. No pain with calf compression, swelling, warmth, erythema  Assessment: Onychomycosis, currently on Jublia  Plan: -All treatment options discussed with the patient including all alternatives, risks, complications.  -As a courtesy debrided the nails with any complications or bleeding.  Continue Jublia -Patient encouraged to call the office with any questions, concerns, change in symptoms.   Vivi Barrack DPM

## 2022-03-28 DIAGNOSIS — F4321 Adjustment disorder with depressed mood: Secondary | ICD-10-CM | POA: Diagnosis not present

## 2022-04-12 DIAGNOSIS — F4321 Adjustment disorder with depressed mood: Secondary | ICD-10-CM | POA: Diagnosis not present

## 2022-04-13 DIAGNOSIS — L4050 Arthropathic psoriasis, unspecified: Secondary | ICD-10-CM | POA: Diagnosis not present

## 2022-04-19 ENCOUNTER — Other Ambulatory Visit (HOSPITAL_COMMUNITY): Payer: Self-pay | Admitting: Internal Medicine

## 2022-04-20 ENCOUNTER — Other Ambulatory Visit (HOSPITAL_COMMUNITY): Payer: Self-pay | Admitting: Internal Medicine

## 2022-04-22 DIAGNOSIS — N430 Encysted hydrocele: Secondary | ICD-10-CM | POA: Diagnosis not present

## 2022-04-22 DIAGNOSIS — N35011 Post-traumatic bulbous urethral stricture: Secondary | ICD-10-CM | POA: Diagnosis not present

## 2022-05-17 DIAGNOSIS — F4321 Adjustment disorder with depressed mood: Secondary | ICD-10-CM | POA: Diagnosis not present

## 2022-06-08 DIAGNOSIS — L4050 Arthropathic psoriasis, unspecified: Secondary | ICD-10-CM | POA: Diagnosis not present

## 2022-06-08 DIAGNOSIS — Z79899 Other long term (current) drug therapy: Secondary | ICD-10-CM | POA: Diagnosis not present

## 2022-06-15 DIAGNOSIS — L03115 Cellulitis of right lower limb: Secondary | ICD-10-CM | POA: Diagnosis not present

## 2022-06-15 DIAGNOSIS — L401 Generalized pustular psoriasis: Secondary | ICD-10-CM | POA: Diagnosis not present

## 2022-06-15 DIAGNOSIS — L4050 Arthropathic psoriasis, unspecified: Secondary | ICD-10-CM | POA: Diagnosis not present

## 2022-06-15 DIAGNOSIS — I32 Pericarditis in diseases classified elsewhere: Secondary | ICD-10-CM | POA: Diagnosis not present

## 2022-06-20 DIAGNOSIS — F4321 Adjustment disorder with depressed mood: Secondary | ICD-10-CM | POA: Diagnosis not present

## 2022-06-27 ENCOUNTER — Ambulatory Visit: Payer: BC Managed Care – PPO | Admitting: Podiatry

## 2022-06-27 DIAGNOSIS — M21372 Foot drop, left foot: Secondary | ICD-10-CM | POA: Diagnosis not present

## 2022-06-27 DIAGNOSIS — B351 Tinea unguium: Secondary | ICD-10-CM

## 2022-06-27 NOTE — Progress Notes (Unsigned)
Subjective: Chief Complaint  Patient presents with   Nail Problem    Patient came in today for nail Fungus, Patient states the nails are looking a lot better, TX: Jublia     62 year old male presents with above complaints.  States he is doing well.  He notes a slow process but he is able to see improvement.  He has left dropfoot and he has a brace but is interested in other types of braces.  Objective: AAO x3, NAD DP/PT pulses palpable bilaterally, CRT less than 3 seconds Nails remain hypertrophic, dystrophic with yellow, brown discoloration, the nails do appear to be more clear mostly on the proximal half of the lesser digit nails.  No pain in the nails there is no swelling or redness or any signs of infection. Dropfoot left side No pain with calf compression, swelling, warmth, erythema  Assessment: Onychomycosis, currently on Jublia  Plan: -All treatment options discussed with the patient including all alternatives, risks, complications.  -As a courtesy debrided the nails with any complications or bleeding.  Continue Jublia.  If needed consider other options but he is doing well on this for now. -Prescription for different AFO brace was given.  -Patient encouraged to call the office with any questions, concerns, change in symptoms.   Vivi Barrack DPM

## 2022-07-19 DIAGNOSIS — F4321 Adjustment disorder with depressed mood: Secondary | ICD-10-CM | POA: Diagnosis not present

## 2022-07-29 ENCOUNTER — Other Ambulatory Visit (HOSPITAL_COMMUNITY): Payer: Self-pay | Admitting: Internal Medicine

## 2022-08-03 DIAGNOSIS — L4050 Arthropathic psoriasis, unspecified: Secondary | ICD-10-CM | POA: Diagnosis not present

## 2022-08-06 ENCOUNTER — Emergency Department (HOSPITAL_BASED_OUTPATIENT_CLINIC_OR_DEPARTMENT_OTHER)
Admission: EM | Admit: 2022-08-06 | Discharge: 2022-08-06 | Disposition: A | Discharge status: Home / Self Care | Payer: BC Managed Care – PPO | Attending: Emergency Medicine | Admitting: Emergency Medicine

## 2022-08-06 ENCOUNTER — Encounter (HOSPITAL_BASED_OUTPATIENT_CLINIC_OR_DEPARTMENT_OTHER): Payer: Self-pay | Admitting: Emergency Medicine

## 2022-08-06 ENCOUNTER — Other Ambulatory Visit: Payer: Self-pay

## 2022-08-06 DIAGNOSIS — S61011A Laceration without foreign body of right thumb without damage to nail, initial encounter: Secondary | ICD-10-CM | POA: Diagnosis not present

## 2022-08-06 DIAGNOSIS — W260XXA Contact with knife, initial encounter: Secondary | ICD-10-CM | POA: Diagnosis not present

## 2022-08-06 DIAGNOSIS — Z23 Encounter for immunization: Secondary | ICD-10-CM | POA: Diagnosis not present

## 2022-08-06 DIAGNOSIS — I4891 Unspecified atrial fibrillation: Secondary | ICD-10-CM | POA: Insufficient documentation

## 2022-08-06 MED ORDER — TETANUS-DIPHTH-ACELL PERTUSSIS 5-2.5-18.5 LF-MCG/0.5 IM SUSY
0.5000 mL | PREFILLED_SYRINGE | Freq: Once | INTRAMUSCULAR | Status: AC
  Administered 2022-08-06: 0.5 mL via INTRAMUSCULAR
  Filled 2022-08-06: qty 0.5

## 2022-08-06 NOTE — ED Notes (Signed)
Last tetanus 2017.

## 2022-08-06 NOTE — ED Provider Notes (Signed)
[facility] EMERGENCY DEPT Provider Note   CSN: [id number] Arrival date & time: 08/06/22  1457     History  Chief Complaint  Patient presents with   Extremity Laceration    Mark Gonzalez. is a 62 y.o. male who is anticoagulated on Eliquis secondary to history of atrial fibrillation who presents to the ED complaining of a laceration to the right distal thumb.  Patient states this laceration happened just prior to arrival when he was cutting biscuits with a kitchen knife when he accidentally missed the biscuit and thus cut open his thumb.  He had wounds wrapped prior to arrival and bleeding was well-controlled.  He denies any other injury.  It has been approximately 6 years since his last tetanus vaccination.    Home Medications Prior to Admission medications   Medication Sig Start Date End Date Taking? Authorizing Provider  b complex vitamins tablet Take 1 tablet by mouth daily.    [provider]  Cholecalciferol (VITAMIN D-3) 125 MCG (5000 UT) TABS Take 5,000 Units by mouth daily.    [provider]  diclofenac (VOLTAREN) 75 MG EC tablet Take 75 mg by mouth daily.  09/17/15   [provider]  dofetilide (TIKOSYN) 500 MCG capsule Take 1 capsule (500 mcg total) by mouth 2 (two) times daily. 02/07/22   Bensimhon, Bevelyn Buckles, MD  Efinaconazole 10 % SOLN Apply 1 drop topically daily. 12/09/21   Vivi Barrack, DPM  ELIQUIS 5 MG TABS tablet Take 1 tablet by mouth twice daily. 04/20/22   Bensimhon, Bevelyn Buckles, MD  furosemide (LASIX) 40 MG tablet Take 40 mg by mouth 2 (two) times a week.    [provider]  golimumab (SIMPONI ARIA) 50 MG/4ML SOLN injection Inject into the vein every 8 (eight) weeks.    [provider]  irbesartan (AVAPRO) 150 MG tablet Take 1 tablet by mouth daily. 04/20/22   Bensimhon, Bevelyn Buckles, MD  irbesartan (AVAPRO) 75 MG tablet Take 75 mg by mouth at bedtime.    [provider]  loratadine (CLARITIN) 10  MG tablet Take 10 mg by mouth daily.    [provider]  lovastatin (MEVACOR) 20 MG tablet Take 1 tablet by mouth daily at 6 pm. 07/29/22   Bensimhon, Bevelyn Buckles, MD  Magnesium 400 MG CAPS Take 400 mg by mouth every evening.     [provider]  Multiple Vitamin (MULTIVITAMIN WITH MINERALS) TABS tablet Take 1 tablet by mouth daily.    [provider]  Multiple Vitamins-Minerals (PRESERVISION AREDS 2 PO) Take 1 tablet by mouth in the morning and at bedtime.    [provider]  Omega-3 Fatty Acids (FISH OIL ULTRA) 1400 MG CAPS Take 1,400 mg by mouth 2 (two) times daily.    [provider]  potassium chloride SA (KLOR-CON M) 20 MEQ tablet Take 40 mEq by mouth 2 (two) times a week.    [provider]  tadalafil (CIALIS) 10 MG tablet Take 10 mg by mouth daily as needed for erectile dysfunction.    [provider]      Allergies    Lisinopril    Review of Systems   Review of Systems  Constitutional:  Negative for chills and fever.  HENT:  Negative for ear pain and sore throat.   Eyes:  Negative for pain and visual disturbance.  Respiratory:  Negative for cough and shortness of breath.   Cardiovascular:  Negative for chest pain and palpitations.  Gastrointestinal:  Negative for abdominal pain and vomiting.  Genitourinary:  Negative for dysuria and hematuria.  Musculoskeletal:  Negative for arthralgias and back pain.  Skin:  Positive for wound. Negative for color change and rash.  Neurological:  Negative for seizures and syncope.  All other systems reviewed and are negative.   Physical Exam Updated Vital Signs BP (!) 147/93 (BP Location: Right Arm)   Pulse (!) 55   Temp 97.9 F (36.6 C)   Resp 18   Ht 6' (1.829 m)   Wt 83.9 kg   SpO2 100%   BMI 25.09 kg/m  Physical Exam Vitals and nursing note reviewed.  Constitutional:      General: He is not in acute distress.    Appearance: Normal appearance.  HENT:     Head:  Normocephalic and atraumatic.     Mouth/Throat:     Mouth: Mucous membranes are moist.  Eyes:     Conjunctiva/sclera: Conjunctivae normal.  Cardiovascular:     Rate and Rhythm: Normal rate and regular rhythm.  Pulmonary:     Effort: Pulmonary effort is normal.     Breath sounds: Normal breath sounds.  Musculoskeletal:     Cervical back: Neck supple.     Comments: Right thumb with intact flexion and extension, normal sensation, 2+ radial pulse, 2cm curvilinear laceration to distal tip of right thumb, very slightly oozing blood but bleeding is controlled  Skin:    General: Skin is warm and dry.     Capillary Refill: Capillary refill takes less than 2 seconds.  Neurological:     Mental Status: He is alert. Mental status is at baseline.  Psychiatric:        Behavior: Behavior normal.     ED Results / Procedures / Treatments   Labs (all labs ordered are listed, but only abnormal results are displayed) Labs Reviewed - No data to display  EKG None  Radiology No results found.  Procedures .Marland KitchenLaceration Repair  Date/Time: 08/06/2022 4:10 PM  Performed by: Tonette Lederer, PA-C Authorized by: Tonette Lederer, PA-C   Consent:    Consent obtained:  Verbal   Consent given by:  Patient   Risks, benefits, and alternatives were discussed: yes     Risks discussed:  Infection and pain   Alternatives discussed:  Observation and no treatment Universal protocol:    Procedure explained and questions answered to patient or proxy's satisfaction: yes     Immediately prior to procedure, a time out was called: yes     Patient identity confirmed:  Verbally with patient Anesthesia:    Anesthesia method:  None (discussed risks and benefits of local anesthesia including digital block with patient, he has had lidocaine injections before and with small size of laceration would like to attempt repair without digital block) Laceration details:    Location:  Finger   Finger location:  R thumb    Length (cm):  2 Pre-procedure details:    Preparation:  Patient was prepped and draped in usual sterile fashion Exploration:    Limited defect created (wound extended): no     Hemostasis achieved with:  Direct pressure   Imaging outcome: foreign body not noted     Wound exploration: wound explored through full range of motion and entire depth of wound visualized     Wound extent comment:  Superficial   Contaminated: no   Treatment:    Area cleansed with:  Povidone-iodine   Amount of cleaning:  Standard  Irrigation solution:  Sterile water   Irrigation volume:  38mL   Irrigation method:  Tap   Visualized foreign bodies/material removed: no     Debridement:  None   Layers repaired: superficial. Skin repair:    Repair method:  Sutures   Suture size:  5-0   Suture material:  Nylon   Suture technique:  Simple interrupted   Number of sutures:  2 Approximation:    Approximation:  Close Repair type:    Repair type:  Simple Post-procedure details:    Dressing:  Non-adherent dressing   Procedure completion:  Tolerated well, no immediate complications    Medications Ordered in ED Medications  Tdap (BOOSTRIX) injection 0.5 mL (has no administration in time range)    ED Course/ Medical Decision Making/ A&P                           Medical Decision Making  This is a 62 year old male presenting to the ED with a simple laceration to the tip of the right thumb.  This happened just prior to arrival while patient was using a kitchen knife to cut biscuits.  Patient denies any other possible wound contaminants.  He is neurovascularly intact with full range of motion of the right thumb and hand.  Full extent of the wound was able to be visualized with no foreign body noted.  Discussed risks and benefits of digital block and patient would like to attempt to proceed with laceration repair without local anesthesia. Aware that should he change his mind at any point during procedure we can perform  digital block.  Placed 2 simple interrupted nylon sutures to the laceration.  Patient tolerated this very well with no complications.  Bleeding well-controlled. Tetanus vaccination updated prior to discharge. Patient given instructions to return to primary care, urgent care, or ED in 7-10 days to have sutures removed.  Provided with supplies to bandage wound as well as antibiotic ointment.  Wound care instructions given.  All questions answered.  Patient stable for discharge.         Final Clinical Impression(s) / ED Diagnoses Final diagnoses:  Laceration of right thumb without foreign body without damage to nail, initial encounter    Rx / DC Orders ED Discharge Orders     None         Suzzette Righter, PA-C 08/06/22 1634    Charlesetta Shanks, MD 08/10/22 1559

## 2022-08-06 NOTE — ED Triage Notes (Signed)
Pt is on eliquis 

## 2022-08-06 NOTE — Discharge Instructions (Signed)
Thank you for letting us take care of you today.  We repaired your laceration with 2 sutures. Please have these removed at PCP, urgent care, or in the ED in 7-10 days.  Your tetanus vaccination was updated.  We discussed wound care and I also provided further information on laceration care attached. Please do not submerge wound. Keep clean and dry. If you develop fever, uncontrolled bleeding, redness or drainage from wound, or other concerns for infection, please be re-evaluated by your PCP, at an urgent care, or in the nearest emergency department.

## 2022-08-06 NOTE — ED Triage Notes (Signed)
Pt via pov from home with laceration to right thumb. Pt was cutting a biscuit and cut his finger. Pt has wound covered and wrapped; some blood oozing from wrap. Pt alert & oriented, nad noted.

## 2022-08-06 NOTE — ED Notes (Signed)
Dc instructions reviewed with patient. Patient voiced understanding. Dc with belongings.  °

## 2022-08-16 DIAGNOSIS — I480 Paroxysmal atrial fibrillation: Secondary | ICD-10-CM | POA: Diagnosis not present

## 2022-08-16 DIAGNOSIS — Z4802 Encounter for removal of sutures: Secondary | ICD-10-CM | POA: Diagnosis not present

## 2022-08-29 DIAGNOSIS — F4321 Adjustment disorder with depressed mood: Secondary | ICD-10-CM | POA: Diagnosis not present

## 2022-09-28 DIAGNOSIS — L4050 Arthropathic psoriasis, unspecified: Secondary | ICD-10-CM | POA: Diagnosis not present

## 2022-09-29 ENCOUNTER — Ambulatory Visit: Payer: BC Managed Care – PPO | Admitting: Podiatry

## 2022-09-29 DIAGNOSIS — M21372 Foot drop, left foot: Secondary | ICD-10-CM

## 2022-09-29 DIAGNOSIS — B351 Tinea unguium: Secondary | ICD-10-CM

## 2022-09-29 NOTE — Progress Notes (Signed)
Chief Complaint  Patient presents with   Nail Problem    Nail fungus follow-up, TX: Jublia      62 year old male presents with above complaints.  States he is doing well.  Imelda Pillow is been helping.  He cannot look at the new AFO as he lost the prescription.  Otherwise he has no concerns today.   Objective: AAO x3, NAD DP/PT pulses palpable bilaterally, CRT less than 3 seconds Nails remain hypertrophic, dystrophic with yellow, brown discoloration, the nails do appear to be more clear in nature.  No pain in the nails there is no swelling or redness or any signs of infection. Dropfoot left side No pain with calf compression, swelling, warmth, erythema  Assessment: Onychomycosis, currently on Jublia  Plan: -All treatment options discussed with the patient including all alternatives, risks, complications.  -As a courtesy debrided the nails with any complications or bleeding.  Continue Jublia.  Seems to be improving with this and will continue the same medication. -Prescription for different AFO brace was given.  -Patient encouraged to call the office with any questions, concerns, change in symptoms.   Trula Slade DPM

## 2022-10-03 DIAGNOSIS — F4321 Adjustment disorder with depressed mood: Secondary | ICD-10-CM | POA: Diagnosis not present

## 2022-10-14 ENCOUNTER — Other Ambulatory Visit (HOSPITAL_COMMUNITY): Payer: Self-pay | Admitting: Internal Medicine

## 2022-10-29 ENCOUNTER — Other Ambulatory Visit (HOSPITAL_COMMUNITY): Payer: Self-pay | Admitting: Internal Medicine

## 2022-11-14 DIAGNOSIS — F4321 Adjustment disorder with depressed mood: Secondary | ICD-10-CM | POA: Diagnosis not present

## 2022-11-24 DIAGNOSIS — L4050 Arthropathic psoriasis, unspecified: Secondary | ICD-10-CM | POA: Diagnosis not present

## 2022-11-24 DIAGNOSIS — Z79899 Other long term (current) drug therapy: Secondary | ICD-10-CM | POA: Diagnosis not present

## 2022-11-24 DIAGNOSIS — Z111 Encounter for screening for respiratory tuberculosis: Secondary | ICD-10-CM | POA: Diagnosis not present

## 2022-11-24 DIAGNOSIS — R5383 Other fatigue: Secondary | ICD-10-CM | POA: Diagnosis not present

## 2022-12-14 DIAGNOSIS — L401 Generalized pustular psoriasis: Secondary | ICD-10-CM | POA: Diagnosis not present

## 2022-12-14 DIAGNOSIS — L03115 Cellulitis of right lower limb: Secondary | ICD-10-CM | POA: Diagnosis not present

## 2022-12-14 DIAGNOSIS — I32 Pericarditis in diseases classified elsewhere: Secondary | ICD-10-CM | POA: Diagnosis not present

## 2022-12-14 DIAGNOSIS — L4050 Arthropathic psoriasis, unspecified: Secondary | ICD-10-CM | POA: Diagnosis not present

## 2022-12-19 DIAGNOSIS — R3912 Poor urinary stream: Secondary | ICD-10-CM | POA: Diagnosis not present

## 2022-12-19 DIAGNOSIS — N35011 Post-traumatic bulbous urethral stricture: Secondary | ICD-10-CM | POA: Diagnosis not present

## 2022-12-20 ENCOUNTER — Other Ambulatory Visit (HOSPITAL_COMMUNITY): Payer: Self-pay | Admitting: Internal Medicine

## 2022-12-27 DIAGNOSIS — F4321 Adjustment disorder with depressed mood: Secondary | ICD-10-CM | POA: Diagnosis not present

## 2022-12-29 ENCOUNTER — Ambulatory Visit: Payer: BC Managed Care – PPO | Admitting: Podiatry

## 2022-12-29 DIAGNOSIS — B351 Tinea unguium: Secondary | ICD-10-CM | POA: Diagnosis not present

## 2022-12-29 MED ORDER — EFINACONAZOLE 10 % EX SOLN: 1.0000 [drp] | Freq: Every day | CUTANEOUS | 11 refills | Status: DC

## 2022-12-29 NOTE — Progress Notes (Signed)
Chief Complaint  Patient presents with   Nail Problem    Nail trim and fungus follow up  Pt stated that he needs a refill      62 year old male presents with above complaints.  States he is doing well.  Hermine Messick is been helping.  He is also obese and starting a biotin supplement.  Objective: AAO x3, NAD DP/PT pulses palpable bilaterally, CRT less than 3 seconds Nails remain hypertrophic, dystrophic with yellow, brown discoloration.  There is some clearing on the proximal nail folds.  No pain in the nails there is no swelling or redness or any signs of infection. Dropfoot left side No pain with calf compression, swelling, warmth, erythema  Assessment: Onychomycosis, currently on Jublia  Plan: -All treatment options discussed with the patient including all alternatives, risks, complications.  -As a courtesy debrided the nails with any complications or bleeding.  Continue Jublia.  Seems to be improving with this and will continue the same medication.  Continue biotin. -Patient encouraged to call the office with any questions, concerns, change in symptoms.   Vivi Barrack DPM

## 2023-01-02 DIAGNOSIS — I10 Essential (primary) hypertension: Secondary | ICD-10-CM | POA: Diagnosis not present

## 2023-01-02 DIAGNOSIS — Z131 Encounter for screening for diabetes mellitus: Secondary | ICD-10-CM | POA: Diagnosis not present

## 2023-01-02 DIAGNOSIS — Z125 Encounter for screening for malignant neoplasm of prostate: Secondary | ICD-10-CM | POA: Diagnosis not present

## 2023-01-02 DIAGNOSIS — E7800 Pure hypercholesterolemia, unspecified: Secondary | ICD-10-CM | POA: Diagnosis not present

## 2023-01-02 DIAGNOSIS — Z0000 Encounter for general adult medical examination without abnormal findings: Secondary | ICD-10-CM | POA: Diagnosis not present

## 2023-01-16 ENCOUNTER — Other Ambulatory Visit (HOSPITAL_COMMUNITY): Payer: Self-pay | Admitting: Internal Medicine

## 2023-01-23 DIAGNOSIS — L4050 Arthropathic psoriasis, unspecified: Secondary | ICD-10-CM | POA: Diagnosis not present

## 2023-02-20 DIAGNOSIS — F4321 Adjustment disorder with depressed mood: Secondary | ICD-10-CM | POA: Diagnosis not present

## 2023-03-20 DIAGNOSIS — L4050 Arthropathic psoriasis, unspecified: Secondary | ICD-10-CM | POA: Diagnosis not present

## 2023-03-30 ENCOUNTER — Other Ambulatory Visit (HOSPITAL_COMMUNITY): Payer: Self-pay | Admitting: Internal Medicine

## 2023-03-30 ENCOUNTER — Ambulatory Visit: Payer: BC Managed Care – PPO | Admitting: Podiatry

## 2023-03-30 DIAGNOSIS — B351 Tinea unguium: Secondary | ICD-10-CM

## 2023-03-30 DIAGNOSIS — M21372 Foot drop, left foot: Secondary | ICD-10-CM | POA: Diagnosis not present

## 2023-04-04 DIAGNOSIS — F4321 Adjustment disorder with depressed mood: Secondary | ICD-10-CM | POA: Diagnosis not present

## 2023-04-05 NOTE — Progress Notes (Signed)
No chief complaint on file.   62 year old male presents for concerns of nail fungus.  He states he is doing well and still using the Jublia he is seeing much improvement with this.  No pain in the nail at this time no swelling redness or drainage.  No other concerns.   Objective: AAO x3, NAD DP/PT pulses palpable bilaterally, CRT less than 3 seconds Nails remain hypertrophic, dystrophic with yellow, brown discoloration.  There is clearing on the proximal nail folds.  Overall and seem to be improving.  No pain in the nails there is no swelling or redness or any signs of infection. Dropfoot left side No pain with calf compression, swelling, warmth, erythema  Assessment: Onychomycosis, currently on Jublia  Plan: -All treatment options discussed with the patient including all alternatives, risks, complications.  -As a courtesy debrided the nails with any complications or bleeding.  Continue Jublia as it seems to be improving although slowly. -Continue brace for dropfoot.  He is comfortable with the brace he has.  He asked about the external brace but since his current brace is doing well for Continue with this. -Patient encouraged to call the office with any questions, concerns, change in symptoms.   Vivi Barrack DPM

## 2023-04-12 ENCOUNTER — Other Ambulatory Visit (HOSPITAL_COMMUNITY): Payer: Self-pay | Admitting: Internal Medicine

## 2023-05-15 DIAGNOSIS — L4050 Arthropathic psoriasis, unspecified: Secondary | ICD-10-CM | POA: Diagnosis not present

## 2023-05-15 DIAGNOSIS — Z79899 Other long term (current) drug therapy: Secondary | ICD-10-CM | POA: Diagnosis not present

## 2023-05-24 DIAGNOSIS — F4321 Adjustment disorder with depressed mood: Secondary | ICD-10-CM | POA: Diagnosis not present

## 2023-06-22 ENCOUNTER — Ambulatory Visit: Payer: BC Managed Care – PPO | Admitting: Podiatry

## 2023-06-22 ENCOUNTER — Encounter: Payer: Self-pay | Admitting: Podiatry

## 2023-06-22 DIAGNOSIS — B351 Tinea unguium: Secondary | ICD-10-CM

## 2023-06-22 NOTE — Progress Notes (Signed)
Chief Complaint  Patient presents with   Nail Problem    RM#12 follow up on toe nail fungus patient stat coming along.   62 year old male presents for concerns of nail fungus.  He states he is doing well and still using the Jublia he is seeing much improvement with this.  States this is for process with getting better. Monitoring drainage.  Objective: AAO x3, NAD DP/PT pulses palpable bilaterally, CRT less than 3 seconds Nails remain hypertrophic, dystrophic with yellow, brown discoloration.  There is clearing on the proximal nail folds.  Overall and seem to be improving.  No pain in the nails there is no swelling or redness or any signs of infection. Dropfoot left side No pain with calf compression, swelling, warmth, erythema  Assessment: Onychomycosis, currently on Jublia  Plan: -All treatment options discussed with the patient including all alternatives, risks, complications.  -Sharply pared the nails with any complications or bleeding. Continue with the Jublia as it seems to be helping although slowly. -Continue brace for dropfoot.  He is comfortable with the brace he has.  He asked about the external brace but since his current brace is doing well for Continue with this. -Patient encouraged to call the office with any questions, concerns, change in symptoms.   Vivi Barrack DPM

## 2023-06-30 ENCOUNTER — Other Ambulatory Visit (HOSPITAL_COMMUNITY): Payer: Self-pay | Admitting: Internal Medicine

## 2023-07-10 DIAGNOSIS — L4050 Arthropathic psoriasis, unspecified: Secondary | ICD-10-CM | POA: Diagnosis not present

## 2023-07-17 DIAGNOSIS — F4321 Adjustment disorder with depressed mood: Secondary | ICD-10-CM | POA: Diagnosis not present

## 2023-07-22 ENCOUNTER — Other Ambulatory Visit (HOSPITAL_COMMUNITY): Payer: Self-pay | Admitting: Internal Medicine

## 2023-08-22 DIAGNOSIS — F4321 Adjustment disorder with depressed mood: Secondary | ICD-10-CM | POA: Diagnosis not present

## 2023-08-25 DIAGNOSIS — L401 Generalized pustular psoriasis: Secondary | ICD-10-CM | POA: Diagnosis not present

## 2023-08-25 DIAGNOSIS — L03115 Cellulitis of right lower limb: Secondary | ICD-10-CM | POA: Diagnosis not present

## 2023-08-25 DIAGNOSIS — L4050 Arthropathic psoriasis, unspecified: Secondary | ICD-10-CM | POA: Diagnosis not present

## 2023-08-25 DIAGNOSIS — I32 Pericarditis in diseases classified elsewhere: Secondary | ICD-10-CM | POA: Diagnosis not present

## 2023-09-04 DIAGNOSIS — L4050 Arthropathic psoriasis, unspecified: Secondary | ICD-10-CM | POA: Diagnosis not present

## 2023-09-08 ENCOUNTER — Encounter (HOSPITAL_COMMUNITY): Payer: Self-pay | Admitting: Internal Medicine

## 2023-09-08 ENCOUNTER — Ambulatory Visit (HOSPITAL_COMMUNITY)
Admission: RE | Admit: 2023-09-08 | Discharge: 2023-09-08 | Disposition: A | Discharge status: Home / Self Care | Payer: BC Managed Care – PPO | Source: Ambulatory Visit | Attending: Internal Medicine | Admitting: Internal Medicine

## 2023-09-08 VITALS — BP 120/78 | HR 59 | Wt 207.4 lb

## 2023-09-08 DIAGNOSIS — Z79899 Other long term (current) drug therapy: Secondary | ICD-10-CM | POA: Diagnosis not present

## 2023-09-08 DIAGNOSIS — I4891 Unspecified atrial fibrillation: Secondary | ICD-10-CM

## 2023-09-08 DIAGNOSIS — Z7901 Long term (current) use of anticoagulants: Secondary | ICD-10-CM | POA: Insufficient documentation

## 2023-09-08 DIAGNOSIS — E785 Hyperlipidemia, unspecified: Secondary | ICD-10-CM | POA: Insufficient documentation

## 2023-09-08 DIAGNOSIS — I480 Paroxysmal atrial fibrillation: Secondary | ICD-10-CM | POA: Insufficient documentation

## 2023-09-08 DIAGNOSIS — I4510 Unspecified right bundle-branch block: Secondary | ICD-10-CM | POA: Insufficient documentation

## 2023-09-08 DIAGNOSIS — Z6828 Body mass index (BMI) 28.0-28.9, adult: Secondary | ICD-10-CM | POA: Diagnosis not present

## 2023-09-08 DIAGNOSIS — I10 Essential (primary) hypertension: Secondary | ICD-10-CM

## 2023-09-08 DIAGNOSIS — L4050 Arthropathic psoriasis, unspecified: Secondary | ICD-10-CM | POA: Insufficient documentation

## 2023-09-08 DIAGNOSIS — E669 Obesity, unspecified: Secondary | ICD-10-CM | POA: Diagnosis not present

## 2023-09-08 DIAGNOSIS — I5032 Chronic diastolic (congestive) heart failure: Secondary | ICD-10-CM

## 2023-09-08 NOTE — Patient Instructions (Signed)
 No change in medications. Return to see Dr. Julane Ny in 12 months. Please call us  at [phone] in January 2026 to schedule this appointment. Please call us  at [phone] if any questions or concerns prior to your next visit.

## 2023-09-08 NOTE — Addendum Note (Signed)
 Encounter addended by: Edman Gory, RN on: 09/08/2023 3:14 PM  Actions taken: Clinical Note Signed

## 2023-09-08 NOTE — Progress Notes (Signed)
 Patient ID: Mark Gonzalez., male   DOB: 1960-09-16, 63 y.o.   MRN: [id number] . Cardiology Clinic Note  PCP: Mark Slater, MD  Chief complaint: Atrial fibrillation  Subjective:    Mark Gonzalez is a very pleasant 63 y/o male with a history of psoriatic arthritis, obesity, hypertension, and atrial fibrillation.   Admitted several times in March 2015 with acute pericarditis. Treated with colchicine  and steroids. Had recurrence and readmitted. Echo with moderate pericardial effusion. Started on prednisone  60 daily. Effusion resolved after 1 week. Remicade  stopped as it was thought that it might be causative. Has not recurred.  Previously was working with [facility] and lost a lot of weight. On 09/24/19 had spinal surgery which went well except for LE edema. Now had developed peroneal neuropathy with left foot drop. Had peroneal nerve release in 5/21.    Failed flecainide  and now maintained in [place] on Tikosyn   He returns for  Heart Failure/ A fib follow up. Says he is feeling great. Has personal trainer 3x/week for at least an hour. Has kept his weight down. Denies CP or SOB. No palpitations.  \  Current Outpatient Medications on File Prior to Encounter  Medication Sig Dispense Refill   b complex vitamins tablet Take 1 tablet by mouth daily.     Cholecalciferol  (VITAMIN D -3) 125 MCG (5000 UT) TABS Take 5,000 Units by mouth daily.     diclofenac  (VOLTAREN ) 75 MG EC tablet Take 75 mg by mouth daily.   0   dofetilide  (TIKOSYN ) 500 MCG capsule Take 1 capsule by mouth twice daily. 180 capsule 2   Efinaconazole  10 % SOLN Apply 1 drop topically daily. 4 mL 11   ELIQUIS  5 MG TABS tablet Take 1 tablet by mouth twice daily. Please schedule an appointment with your physician for further refills. 180 tablet 0   furosemide  (LASIX ) 40 MG tablet Take 40 mg by mouth 2 (two) times a week.     golimumab  (SIMPONI  ARIA) 50 MG/4ML SOLN injection Inject into the vein every 8 (eight) weeks.     irbesartan  (AVAPRO )  150 MG tablet Take 1 tablet by mouth daily. Please schedule an office visit for further refills. 30 tablet 1   loratadine  (CLARITIN ) 10 MG tablet Take 10 mg by mouth daily.     lovastatin  (MEVACOR ) 20 MG tablet Take 1 tablet by mouth daily at 6 pm. Please schedule an office visit for further refills. 30 tablet 1   Magnesium  400 MG CAPS Take 400 mg by mouth every evening.      Multiple Vitamin (MULTIVITAMIN WITH MINERALS) TABS tablet Take 1 tablet by mouth daily.     Multiple Vitamins-Minerals (PRESERVISION AREDS 2 PO) Take 1 tablet by mouth in the morning and at bedtime.     Omega-3 Fatty Acids  (FISH OIL ULTRA) 1400 MG CAPS Take 1,400 mg by mouth 2 (two) times daily.     potassium chloride  SA (KLOR-CON  M) 20 MEQ tablet Take 20 mEq by mouth 2 (two) times a week.     tadalafil (CIALIS) 10 MG tablet Take 10 mg by mouth daily as needed for erectile dysfunction.     tamsulosin (FLOMAX) 0.4 MG CAPS capsule Take 0.4 mg by mouth daily.     irbesartan  (AVAPRO ) 75 MG tablet Take 75 mg by mouth at bedtime.     No current facility-administered medications on file prior to encounter.    Objective:       Weight change: Vitals:   09/08/23 1411  BP: 120/78  Pulse: (!) 59  SpO2: 100%  Weight: 94.1 kg (207 lb 6.4 oz)     Wt Readings from Last 3 Encounters:  09/08/23 94.1 kg (207 lb 6.4 oz)  08/06/22 83.9 kg (185 lb)  02/07/22 84.6 kg (186 lb 6.4 oz)    Physical Exam: General:  Well appearing. No resp difficulty HEENT: normal Neck: supple. no JVD. Carotids 2+ bilat; no bruits. No lymphadenopathy or thryomegaly appreciated. Cor: PMI nondisplaced. Regular rate & rhythm. No rubs, gallops or murmurs. Lungs: clear Abdomen: soft, nontender, nondistended. No hepatosplenomegaly. No bruits or masses. Good bowel sounds. Extremities: no cyanosis, clubbing, rash, edema Neuro: alert & orientedx3, cranial nerves grossly intact. moves all 4 extremities w/o difficulty. Affect pleasant  ECG: NSR 61 RBBB QTc  Personally reviewed  Assessment/Plan:   1. PAF  -s/p previous AF ablation at [facility] in [place] on Tikosyn . ECG ok - Remains on Eliquis  5 bid. No bleeding 2. Morbid obesity  -Body mass index is 28.13 kg/m. - Has lost over 230 pounds. Doing great. 3. Psoriatic Arthritis  - Follows with Dr. Mai 4. HTN  -Blood pressure well controlled. Continue current regimen. 5. H/o pericarditis felt to be related to Remicade  2015 6. Hyperlipidemia - Followed by PCP but hasn't seen in over 1 year. Will get labs  7. LE edema - Resolved   [name], MD  3:00 PM

## 2023-09-17 ENCOUNTER — Other Ambulatory Visit (HOSPITAL_COMMUNITY): Payer: Self-pay | Admitting: Internal Medicine

## 2023-09-20 DIAGNOSIS — F4321 Adjustment disorder with depressed mood: Secondary | ICD-10-CM | POA: Diagnosis not present

## 2023-09-22 ENCOUNTER — Other Ambulatory Visit (HOSPITAL_COMMUNITY): Payer: Self-pay | Admitting: Internal Medicine

## 2023-09-22 ENCOUNTER — Ambulatory Visit: Payer: BC Managed Care – PPO | Admitting: Podiatry

## 2023-09-22 ENCOUNTER — Encounter: Payer: Self-pay | Admitting: Podiatry

## 2023-09-22 DIAGNOSIS — M21372 Foot drop, left foot: Secondary | ICD-10-CM | POA: Diagnosis not present

## 2023-09-22 DIAGNOSIS — B351 Tinea unguium: Secondary | ICD-10-CM | POA: Diagnosis not present

## 2023-09-25 NOTE — Progress Notes (Signed)
 Chief Complaint  Patient presents with   RFC    Rm#14 RFC patient has no concerns at this time.    63 year old male presents for concerns of nail fungus.  He states he is doing well and still using the Jublia he is seeing much improvement with this.  States is not slow process but getting better.  Patient asking if we can order replacement parts for sprays.  Objective: AAO x3, NAD DP/PT pulses palpable bilaterally, CRT less than 3 seconds Nails remain hypertrophic, dystrophic with yellow, brown discoloration.  There is clearing on the proximal nail folds.  Color seems to be improving.  No pain in the nails there is no swelling or redness or any signs of infection. Dropfoot left side No pain with calf compression, swelling, warmth, erythema  Assessment: Onychomycosis, currently on Jublia  Plan: -All treatment options discussed with the patient including all alternatives, risks, complications.  -Sharply pared the nails with any complications or bleeding. Continue with the Jublia as it seems to be helping and he agrees to continue with this treatment. -I have the part number for the brace that needs to be replaced will see if we can try to order a replacement part for him. -Patient encouraged to call the office with any questions, concerns, change in symptoms.   Vivi Barrack DPM

## 2023-10-06 ENCOUNTER — Encounter: Payer: Self-pay | Admitting: Podiatry

## 2023-10-06 NOTE — Telephone Encounter (Signed)
 Nicki Guadalajara- have you been able to look into this? Thanks for your help!

## 2023-10-30 DIAGNOSIS — L4050 Arthropathic psoriasis, unspecified: Secondary | ICD-10-CM | POA: Diagnosis not present

## 2023-10-30 DIAGNOSIS — R5383 Other fatigue: Secondary | ICD-10-CM | POA: Diagnosis not present

## 2023-10-30 DIAGNOSIS — Z111 Encounter for screening for respiratory tuberculosis: Secondary | ICD-10-CM | POA: Diagnosis not present

## 2023-10-30 DIAGNOSIS — Z79899 Other long term (current) drug therapy: Secondary | ICD-10-CM | POA: Diagnosis not present

## 2023-11-22 DIAGNOSIS — F4321 Adjustment disorder with depressed mood: Secondary | ICD-10-CM | POA: Diagnosis not present

## 2023-11-30 ENCOUNTER — Other Ambulatory Visit: Payer: Self-pay | Admitting: Podiatry

## 2023-12-21 ENCOUNTER — Ambulatory Visit: Payer: BC Managed Care – PPO | Admitting: Podiatry

## 2023-12-21 ENCOUNTER — Encounter: Payer: Self-pay | Admitting: Podiatry

## 2023-12-21 DIAGNOSIS — B351 Tinea unguium: Secondary | ICD-10-CM | POA: Diagnosis not present

## 2023-12-25 NOTE — Progress Notes (Signed)
 Chief Complaint  Patient presents with   Nail Problem    F/u onycomycosis. Jublia  topical for treatment. Non diabetic.    63 year old male presents for concerns of nail fungus.  He states he is doing well and still using the Jublia  he is seeing much improvement with this, although reports this is a slow process.    Objective: AAO x3, NAD DP/PT pulses palpable bilaterally, CRT less than 3 seconds Nails remain hypertrophic, dystrophic with yellow, brown discoloration.  There is clearing on the proximal nail folds.  The color nails is much improved compared to what it was previously when we first started.  No pain in the nails there is no swelling or redness or any signs of infection. Dropfoot left side No pain with calf compression, swelling, warmth, erythema  Assessment: Onychomycosis, currently on Jublia   Plan: -All treatment options discussed with the patient including all alternatives, risks, complications.  -Sharply pared the nails with any complications or bleeding. Continue with the Jublia  as it seems to be helping and he agrees to continue with this treatment. -Patient encouraged to call the office with any questions, concerns, change in symptoms.   Charity Conch DPM

## 2023-12-26 DIAGNOSIS — L4050 Arthropathic psoriasis, unspecified: Secondary | ICD-10-CM | POA: Diagnosis not present

## 2024-01-02 DIAGNOSIS — I10 Essential (primary) hypertension: Secondary | ICD-10-CM | POA: Diagnosis not present

## 2024-01-02 DIAGNOSIS — Z0000 Encounter for general adult medical examination without abnormal findings: Secondary | ICD-10-CM | POA: Diagnosis not present

## 2024-01-02 DIAGNOSIS — E7800 Pure hypercholesterolemia, unspecified: Secondary | ICD-10-CM | POA: Diagnosis not present

## 2024-01-09 DIAGNOSIS — F4321 Adjustment disorder with depressed mood: Secondary | ICD-10-CM | POA: Diagnosis not present

## 2024-01-16 DIAGNOSIS — N35011 Post-traumatic bulbous urethral stricture: Secondary | ICD-10-CM | POA: Diagnosis not present

## 2024-01-16 DIAGNOSIS — N400 Enlarged prostate without lower urinary tract symptoms: Secondary | ICD-10-CM | POA: Diagnosis not present

## 2024-01-16 DIAGNOSIS — R3912 Poor urinary stream: Secondary | ICD-10-CM | POA: Diagnosis not present

## 2024-02-21 DIAGNOSIS — L4050 Arthropathic psoriasis, unspecified: Secondary | ICD-10-CM | POA: Diagnosis not present

## 2024-03-01 DIAGNOSIS — Z23 Encounter for immunization: Secondary | ICD-10-CM | POA: Diagnosis not present

## 2024-03-08 DIAGNOSIS — L401 Generalized pustular psoriasis: Secondary | ICD-10-CM | POA: Diagnosis not present

## 2024-03-08 DIAGNOSIS — I32 Pericarditis in diseases classified elsewhere: Secondary | ICD-10-CM | POA: Diagnosis not present

## 2024-03-08 DIAGNOSIS — L03115 Cellulitis of right lower limb: Secondary | ICD-10-CM | POA: Diagnosis not present

## 2024-03-08 DIAGNOSIS — L4050 Arthropathic psoriasis, unspecified: Secondary | ICD-10-CM | POA: Diagnosis not present

## 2024-03-12 DIAGNOSIS — F4321 Adjustment disorder with depressed mood: Secondary | ICD-10-CM | POA: Diagnosis not present

## 2024-03-21 ENCOUNTER — Ambulatory Visit (INDEPENDENT_AMBULATORY_CARE_PROVIDER_SITE_OTHER): Admitting: Podiatry

## 2024-03-21 ENCOUNTER — Encounter: Payer: Self-pay | Admitting: Podiatry

## 2024-03-21 DIAGNOSIS — B351 Tinea unguium: Secondary | ICD-10-CM

## 2024-03-21 DIAGNOSIS — M79674 Pain in right toe(s): Secondary | ICD-10-CM | POA: Diagnosis not present

## 2024-03-21 DIAGNOSIS — M79675 Pain in left toe(s): Secondary | ICD-10-CM | POA: Diagnosis not present

## 2024-03-21 NOTE — Progress Notes (Signed)
 Chief Complaint  Patient presents with   Nail Problem    Patient states that everything has been good since last visit.    63 year old male presents for concerns of nail fungus.  He still use the Jublia  is a slow process he states but is getting better.  His nails are elongated and are causing her.  No swelling or redness or any drainage.  No open lesions.  Objective: AAO x3, NAD DP/PT pulses palpable bilaterally, CRT less than 3 seconds Nails remain hypertrophic, dystrophic with yellow, brown discoloration.  There is some clearing on the proximal nail folds.  The color nails is much improved compared to what it was previously when we first started.  No pain in the nails there is no swelling or redness or any signs of infection.  Nails that become elongated and the rubbing of the adjacent toes. Dropfoot left side No pain with calf compression, swelling, warmth, erythema  Assessment: Onychomycosis, currently on Jublia   Plan: -All treatment options discussed with the patient including all alternatives, risks, complications.  -Sharply debrided nails x 10 without any complications or bleeding. Continue with the Jublia  as it seems to be helping and he agrees to continue with this treatment. -Patient encouraged to call the office with any questions, concerns, change in symptoms.   Donnice JONELLE Fees DPM

## 2024-03-28 ENCOUNTER — Other Ambulatory Visit (HOSPITAL_COMMUNITY): Payer: Self-pay

## 2024-03-28 DIAGNOSIS — I5032 Chronic diastolic (congestive) heart failure: Secondary | ICD-10-CM

## 2024-04-17 DIAGNOSIS — L4050 Arthropathic psoriasis, unspecified: Secondary | ICD-10-CM | POA: Diagnosis not present

## 2024-04-22 ENCOUNTER — Other Ambulatory Visit: Payer: Self-pay | Admitting: Medical Genetics

## 2024-05-09 ENCOUNTER — Other Ambulatory Visit: Payer: Self-pay

## 2024-05-09 DIAGNOSIS — Z006 Encounter for examination for normal comparison and control in clinical research program: Secondary | ICD-10-CM

## 2024-05-10 DIAGNOSIS — Z23 Encounter for immunization: Secondary | ICD-10-CM | POA: Diagnosis not present

## 2024-05-18 LAB — GENECONNECT MOLECULAR SCREEN: Genetic Analysis Overall Interpretation: NEGATIVE

## 2024-06-12 DIAGNOSIS — L4050 Arthropathic psoriasis, unspecified: Secondary | ICD-10-CM | POA: Diagnosis not present

## 2024-06-21 ENCOUNTER — Encounter: Payer: Self-pay | Admitting: Podiatry

## 2024-06-21 ENCOUNTER — Ambulatory Visit: Admitting: Podiatry

## 2024-06-21 DIAGNOSIS — M79674 Pain in right toe(s): Secondary | ICD-10-CM

## 2024-06-21 DIAGNOSIS — B351 Tinea unguium: Secondary | ICD-10-CM

## 2024-06-21 DIAGNOSIS — M79675 Pain in left toe(s): Secondary | ICD-10-CM | POA: Diagnosis not present

## 2024-06-21 NOTE — Progress Notes (Signed)
 Chief Complaint  Patient presents with   RFC     RFC Non diabetic toenail trim. Onychomycosis check.    63 year old male presents for concerns of nail fungus.  He still use the Jublia  is a slow process he states but it is better. His nails are elongated and are causing discomfort at times.  No swelling or redness or any drainage.  No open lesions.  He has been trying to get a new cuff kit for his brace.   Objective: AAO x3, NAD DP/PT pulses palpable bilaterally, CRT less than 3 seconds Nails remain hypertrophic, dystrophic with yellow, brown discoloration.  There is some clearing on the proximal nail folds.   Nails that become elongated and the rubbing of the adjacent toes resulting in tenderness in nails 1-5 bilaterally.  Dropfoot left side No pain with calf compression, swelling, warmth, erythema  Assessment: Onychomycosis, currently on Jublia   Plan: -All treatment options discussed with the patient including all alternatives, risks, complications.  -Sharply debrided nails x 10 without any complications or bleeding. Continue with the Jublia .  -Information given as to the company he can contact to get a new cuff kit for the brace (he got the brace from [employer] and they are not able to help with getting a new one).  -Patient encouraged to call the office with any questions, concerns, change in symptoms.   Return in about 3 months (around 09/21/2024).  Donnice JONELLE Fees DPM

## 2024-06-24 DIAGNOSIS — F4321 Adjustment disorder with depressed mood: Secondary | ICD-10-CM | POA: Diagnosis not present

## 2024-07-21 ENCOUNTER — Other Ambulatory Visit (HOSPITAL_COMMUNITY): Payer: Self-pay | Admitting: Internal Medicine

## 2024-08-27 ENCOUNTER — Other Ambulatory Visit (HOSPITAL_COMMUNITY): Payer: Self-pay | Admitting: Internal Medicine

## 2024-09-03 ENCOUNTER — Other Ambulatory Visit (HOSPITAL_COMMUNITY): Payer: Self-pay

## 2024-09-03 DIAGNOSIS — I5032 Chronic diastolic (congestive) heart failure: Secondary | ICD-10-CM

## 2024-09-03 MED ORDER — IRBESARTAN 150 MG PO TABS: 150.0000 mg | ORAL_TABLET | Freq: Every day | ORAL | 1 refills | Status: AC

## 2024-09-03 MED ORDER — LOVASTATIN 20 MG PO TABS: 20.0000 mg | ORAL_TABLET | Freq: Every day | ORAL | 1 refills | Status: AC

## 2024-09-19 ENCOUNTER — Ambulatory Visit (HOSPITAL_COMMUNITY): Admitting: Internal Medicine

## 2024-09-20 ENCOUNTER — Ambulatory Visit: Payer: Self-pay | Admitting: Podiatry
